# Patient Record
Sex: Male | Born: 1990 | Race: White | Hispanic: No | Marital: Married | Smoking: Current every day smoker
Health system: Southern US, Community
[De-identification: ages and names within clinical notes are randomized; demographics above are authoritative.]

## PROBLEM LIST (undated history)

## (undated) DIAGNOSIS — F419 Anxiety disorder, unspecified: Secondary | ICD-10-CM

## (undated) DIAGNOSIS — I10 Essential (primary) hypertension: Secondary | ICD-10-CM

## (undated) DIAGNOSIS — F329 Major depressive disorder, single episode, unspecified: Secondary | ICD-10-CM

## (undated) DIAGNOSIS — F32A Depression, unspecified: Secondary | ICD-10-CM

## (undated) DIAGNOSIS — I729 Aneurysm of unspecified site: Secondary | ICD-10-CM

## (undated) HISTORY — DX: Essential (primary) hypertension: I10

## (undated) HISTORY — DX: Anxiety disorder, unspecified: F41.9

## (undated) HISTORY — DX: Major depressive disorder, single episode, unspecified: F32.9

## (undated) HISTORY — PX: COSMETIC SURGERY: SHX468

## (undated) HISTORY — DX: Depression, unspecified: F32.A

---
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
## 2012-01-14 ENCOUNTER — Ambulatory Visit (INDEPENDENT_AMBULATORY_CARE_PROVIDER_SITE_OTHER): Payer: BC Managed Care – PPO | Admitting: Family Medicine

## 2012-01-14 VITALS — BP 162/79 | HR 65 | Temp 98.6°F | Resp 16 | Ht 68.5 in | Wt 156.6 lb

## 2012-01-14 DIAGNOSIS — F419 Anxiety disorder, unspecified: Secondary | ICD-10-CM

## 2012-01-14 DIAGNOSIS — F909 Attention-deficit hyperactivity disorder, unspecified type: Secondary | ICD-10-CM

## 2012-01-14 DIAGNOSIS — F411 Generalized anxiety disorder: Secondary | ICD-10-CM

## 2012-01-14 MED ORDER — AMPHETAMINE-DEXTROAMPHETAMINE 10 MG PO TABS: ORAL_TABLET | ORAL | Status: DC

## 2012-01-14 MED ORDER — ALPRAZOLAM 0.5 MG PO TABS: ORAL_TABLET | ORAL | Status: DC

## 2012-01-14 NOTE — Progress Notes (Signed)
Patient Name: Stephen Castro Date of Birth: [birth year] Medical Record Number: [id number] Gender: male Date of Encounter: 01/14/2012  History of Present Illness:  Stephen Castro is a 21 y.o. very pleasant male patient who presents with the following:  He was last seen here in November and was started on adderall- he was given 10mg  #60- he still has not gone through this prescription - he really has not used it regularly.  He has just used it for special tests/ quizzes, etc.  He was afraid of going through the medication too fast.  However, he did feel that the 10mg  was helping but did not totally control his symptoms.    He also has some anxiety- he was given xanax 0.5 #30 at his last visit.  He uses these for panic attacks.  He has noted more problems with panic since he had a bad exam result recently.  He had noted that he sometimes needed to take .75 mg to extinguish an attack.  He feels that it helps him just to know that the pills are there in case he needs one.  His bad exam happened about 3 weeks ago.   Stephen Castro notes that he has a hard time sitting still and feels anxious in certain situations, but he does not have a lot of anxiety all the time.  A lot of the time he feels fine and does not feel that he is depressed.  He has a steady girlfriend  Last took adderall about a week ago- also has quit smoking for about a week.  He feels pretty anxious currently due to a stressful event earlier today (he complained about an incorrect order at a fast food restaurant and the manager fired the employee right in front of him) and being at the doctors office now  There is no problem list on file for this patient.  No past medical history on file. No past surgical history on file. History  Substance Use Topics  . Smoking status: Never Smoker   . Smokeless tobacco: Not on file  . Alcohol Use: Not on file   No family history on file. No Known Allergies  Medication list has been reviewed and  updated.  Review of Systems: As per HPI- otherwise negative.  Physical Examination: Filed Vitals:   01/14/12 1744  BP: 162/79  Pulse: 65  Temp: 98.6 F (37 C)  TempSrc: Oral  Resp: 16  Height: 5' 8.5" (1.74 m)  Weight: 156 lb 9.6 oz (71.033 kg)   BP recheck 145/95 Body mass index is 23.46 kg/(m^2).  GEN: WDWN, NAD, Non-toxic, A & O x 3 HEENT: Atraumatic, Normocephalic. Neck supple. No masses, No LAD. Ears and Nose: No external deformity. CV: RRR, No M/G/R. No JVD. No thrill. No extra heart sounds. PULM: CTA B, no wheezes, crackles, rhonchi. No retractions. No resp. distress. No accessory muscle use. EXTR: No c/c/e NEURO Normal gait.  PSYCH: Normally interactive. Conversant. A little talkative and anxious today  Assessment and Plan: 1. ADD (attention deficit disorder with hyperactivity)  amphetamine-dextroamphetamine (ADDERALL) 10 MG tablet, DISCONTINUED: amphetamine-dextroamphetamine (ADDERALL) 10 MG tablet  2. Anxiety  ALPRAZolam (XANAX) 0.5 MG tablet    Had a long discussion with Stephen Castro and his mother today- decided to try and have him take the adderall more regularly to see if he will do better with his schoolwork- this seems to be a major point of anxiety for him.  Gave #2 rx for adderall 10mg - take 1 or 1.5 tablets BID.  #  90, with a second rx to fill 02/13/12.   Also gave rx zanax 0.5mg - may take one or two tablets as needed for panic.  #30, one RF.   He will call me in a couple of weeks with an update- Sooner if worse.   We can continue his current dose of adderall if it is helpful.  Also consider adding an SSRI (did not do great with paxil) for anxiety if needed

## 2012-09-26 ENCOUNTER — Telehealth: Payer: Self-pay

## 2012-09-26 NOTE — Telephone Encounter (Signed)
Dr.Copland Pts mother Kelen Laura would like to speak with you regarding pt's medications and also pt would like to seek counseling. Pt's mother would only like to speak with Dr.Copland regarding this issue. Best# [phone]

## 2012-09-27 NOTE — Telephone Encounter (Signed)
Patient was last seen in March. He has been taking Xanax and Adderall. I have advised he is due for follow for the Adderall. He would also like to see a counselor for depression. Has had depression for several years, do you know a counselor who may be helpful for his situation? He has gone to a couple and has not had good results. I have advised mother will be tomorrow before we call back with this information, she states this is fine.

## 2012-09-28 NOTE — Telephone Encounter (Signed)
Called his mother and went over some possible therapists.  He will come in to see me in the next couple of weeks

## 2012-10-24 ENCOUNTER — Ambulatory Visit (INDEPENDENT_AMBULATORY_CARE_PROVIDER_SITE_OTHER): Payer: BC Managed Care – PPO | Admitting: Family Medicine

## 2012-10-24 VITALS — BP 123/88 | HR 80 | Temp 98.7°F | Resp 18 | Ht 68.0 in | Wt 160.0 lb

## 2012-10-24 DIAGNOSIS — F909 Attention-deficit hyperactivity disorder, unspecified type: Secondary | ICD-10-CM

## 2012-10-24 DIAGNOSIS — F419 Anxiety disorder, unspecified: Secondary | ICD-10-CM

## 2012-10-24 DIAGNOSIS — F411 Generalized anxiety disorder: Secondary | ICD-10-CM

## 2012-10-24 MED ORDER — AMPHETAMINE-DEXTROAMPHETAMINE 10 MG PO TABS: ORAL_TABLET | ORAL | Status: DC

## 2012-10-24 MED ORDER — ALPRAZOLAM 0.5 MG PO TABS: ORAL_TABLET | ORAL | Status: DC

## 2012-10-24 NOTE — Progress Notes (Signed)
[facility] [address], [place] [address] [phone]  Date:  10/24/2012   Name:  Stephen Castro   DOB:  08-18-1991   MRN:  [id number]  PCP:  No primary provider on file.    Chief Complaint: Anxiety and ADHD   History of Present Illness:  Stephen Castro is a 21 y.o. very pleasant male patient who presents with the following:  He is here today to go over medications.  He has a new therapist who he likes- he thinks they will have a good relationship.  They have met once so far and plan to keep meeting; Tom Hedding.  He ran out of his adderall a few days ago- he stopped taking it once school let out.  However, his therapist has suggested he stay on his medication every day instead of skipping weekends and breaks as he had been doing. .  Overall Stephen Castro feels that he is doing well and this is medications are working He has a new job Nurse, children's at [employer] and that is going very well.  His grades have not been as good as he might have liked last semester however.  He had several 8am classes and he has a very hard time waking up and being alert that early and he was overextended with his work and hobbies. He has a plan to do better this spring.    He has used the xanax on occasion for panic attacks, but he has only had a couple of severe attacks over the last several months.  He is taking adderall 10 mg, 10 or 15 mg BID.    Declines a flu shot today  There is no problem list on file for this patient.   Past Medical History  Diagnosis Date  . Depression   . Anxiety     Past Surgical History  Procedure Date  . Cosmetic surgery     History  Substance Use Topics  . Smoking status: Never Smoker   . Smokeless tobacco: Not on file  . Alcohol Use: Yes    No family history on file.  No Known Allergies  Medication list has been reviewed and updated.  Current Outpatient Prescriptions on File Prior to Visit  Medication Sig Dispense Refill  .  ALPRAZolam (XANAX) 0.5 MG tablet Take one or two tablets daily as needed for panic attacks  30 tablet  1  . amphetamine-dextroamphetamine (ADDERALL) 10 MG tablet Take one of one and a half tablets twice daily  90 tablet  0    Review of Systems:  As per HPI- otherwise negative.   Physical Examination: Filed Vitals:   10/24/12 1558  BP: 123/88  Pulse: 80  Temp: 98.7 F (37.1 C)  Resp: 18   Filed Vitals:   10/24/12 1558  Height: 5\' 8"  (1.727 m)  Weight: 160 lb (72.576 kg)   Body mass index is 24.33 kg/(m^2). Ideal Body Weight: Weight in (lb) to have BMI = 25: 164.1   GEN: WDWN, NAD, Non-toxic, A & O x 3 HEENT: Atraumatic, Normocephalic. Neck supple. No masses, No LAD. Ears and Nose: No external deformity. CV: RRR, No M/G/R. No JVD. No thrill. No extra heart sounds. PULM: CTA B, no wheezes, crackles, rhonchi. No retractions. No resp. distress. No accessory muscle use. EXTR: No c/c/e NEURO Normal gait.  PSYCH: Normally interactive. Conversant. Not depressed or anxious appearing.  Calm demeanor.    Assessment and Plan: 1. Anxiety  ALPRAZolam (XANAX) 0.5  MG tablet  2. ADHD (attention deficit hyperactivity disorder)  amphetamine-dextroamphetamine (ADDERALL) 10 MG tablet, amphetamine-dextroamphetamine (ADDERALL) 10 MG tablet, DISCONTINUED: amphetamine-dextroamphetamine (ADDERALL) 10 MG tablet   ADHD and anxiety, doing well.  Refilled his xanax and adderal- xanax 30 with 1RF and adderall for December, January and February.  He will continue to see his therapist and let me know if any problems come up.  He will cut down on time spent on hobbies this semester which should help with his grades  Meds ordered this encounter  Medications  . ALPRAZolam (XANAX) 0.5 MG tablet    Sig: Take one or two tablets daily as needed for panic attacks    Dispense:  30 tablet    Refill:  1  . DISCONTD: amphetamine-dextroamphetamine (ADDERALL) 10 MG tablet    Sig: Take one or one and a half tablets  twice daily    Dispense:  90 tablet    Refill:  0  . amphetamine-dextroamphetamine (ADDERALL) 10 MG tablet    Sig: Take one or one and a half tablets twice daily    Dispense:  90 tablet    Refill:  0    To fill 11/21/2012  . amphetamine-dextroamphetamine (ADDERALL) 10 MG tablet    Sig: Take one or one and a half tablets twice daily    Dispense:  90 tablet    Refill:  0    To fill 12/20/2012     Abbe Amsterdam, MD

## 2012-10-24 NOTE — Patient Instructions (Addendum)
Use the medications as needed and let me know if you are having any problems. Happy Holidays!

## 2013-04-03 ENCOUNTER — Telehealth: Payer: Self-pay

## 2013-04-03 DIAGNOSIS — F909 Attention-deficit hyperactivity disorder, unspecified type: Secondary | ICD-10-CM

## 2013-04-03 NOTE — Telephone Encounter (Signed)
PT IN NEED OF HIS ADDERALL. PLEASE CALL [phone]

## 2013-04-04 ENCOUNTER — Ambulatory Visit (INDEPENDENT_AMBULATORY_CARE_PROVIDER_SITE_OTHER): Payer: BC Managed Care – PPO | Admitting: Family Medicine

## 2013-04-04 VITALS — BP 124/84 | HR 63 | Temp 98.3°F | Resp 16 | Ht 69.0 in | Wt 151.2 lb

## 2013-04-04 DIAGNOSIS — F909 Attention-deficit hyperactivity disorder, unspecified type: Secondary | ICD-10-CM | POA: Insufficient documentation

## 2013-04-04 DIAGNOSIS — F411 Generalized anxiety disorder: Secondary | ICD-10-CM | POA: Insufficient documentation

## 2013-04-04 MED ORDER — AMPHETAMINE-DEXTROAMPHETAMINE 10 MG PO TABS: ORAL_TABLET | ORAL | Status: DC

## 2013-04-04 NOTE — Patient Instructions (Signed)
Continue to use the adderall as directed.  Good luck in [place]!

## 2013-04-04 NOTE — Telephone Encounter (Signed)
Called him to advise he is due for follow up this month. Unable to leave message, voice mail full  Please advise on refill. Pended.

## 2013-04-04 NOTE — Progress Notes (Signed)
[facility] [address], [place] [address] [phone]  Date:  04/04/2013   Name:  Stephen Castro   DOB:  [birth year]   MRN:  [id number]  PCP:  No primary provider on file.    Chief Complaint: Medication Refill   History of Present Illness:  Stephen Castro is a 22 y.o. very pleasant male patient who presents with the following:  He is here today for an ADD refill- he realized he was nearly out of his medication yesterday.  He plans to go to [place] this summer to try his hand at music.   He has stopped drinking- no alcohol for the last 3 months. This seems to be good for his mental health He has used the xanax on occasion- he does not need more.  Uses just occasionally for panic symptoms.  He has been taking 15 mg of adderall BI.  He tolerates this dose well, sleeping normally and good appetite.  Mood has been overall good.    There are no active problems to display for this patient.   Past Medical History  Diagnosis Date  . Depression   . Anxiety     Past Surgical History  Procedure Laterality Date  . Cosmetic surgery      History  Substance Use Topics  . Smoking status: Never Smoker   . Smokeless tobacco: Not on file  . Alcohol Use: No    No family history on file.  No Known Allergies  Medication list has been reviewed and updated.  Current Outpatient Prescriptions on File Prior to Visit  Medication Sig Dispense Refill  . ALPRAZolam (XANAX) 0.5 MG tablet Take one or two tablets daily as needed for panic attacks  30 tablet  1  . amphetamine-dextroamphetamine (ADDERALL) 10 MG tablet Take one or one and a half tablets twice daily  90 tablet  0  . amphetamine-dextroamphetamine (ADDERALL) 10 MG tablet Take one or one and a half tablets twice daily  90 tablet  0   No current facility-administered medications on file prior to visit.    Review of Systems:  As per HPI- otherwise negative.   Physical Examination: Filed Vitals:   04/04/13 1626  BP: 124/84  Pulse: 63  Temp: 98.3 F (36.8 C)  Resp: 16   Filed Vitals:   04/04/13 1626  Height: 5\' 9"  (1.753 m)  Weight: 151 lb 3.2 oz (68.584 kg)   Body mass index is 22.32 kg/(m^2). Ideal Body Weight: Weight in (lb) to have BMI = 25: 168.9  GEN: WDWN, NAD, Non-toxic, A & O x 3 HEENT: Atraumatic, Normocephalic. Neck supple. No masses, No LAD. Ears and Nose: No external deformity. CV: RRR, No M/G/R. No JVD. No thrill. No extra heart sounds. PULM: CTA B, no wheezes, crackles, rhonchi. No retractions. No resp. distress. No accessory muscle use. EXTR: No c/c/e NEURO Normal gait.  PSYCH: Normally interactive. Conversant. Not depressed or anxious appearing.  Calm demeanor.    Assessment and Plan: ADHD (attention deficit hyperactivity disorder) - Plan: amphetamine-dextroamphetamine (ADDERALL) 10 MG tablet, amphetamine-dextroamphetamine (ADDERALL) 10 MG tablet, DISCONTINUED: amphetamine-dextroamphetamine (ADDERALL) 10 MG tablet  Refilled his adderall 15 mg BID for another 3 months today.  Follow- up in 6 months, may refill for another 3 months in September.    Signed Abbe Amsterdam, MD

## 2013-08-01 ENCOUNTER — Ambulatory Visit (INDEPENDENT_AMBULATORY_CARE_PROVIDER_SITE_OTHER): Payer: BC Managed Care – PPO | Admitting: Family Medicine

## 2013-08-01 VITALS — BP 130/80 | HR 77 | Temp 98.1°F | Resp 16 | Ht 68.0 in | Wt 154.4 lb

## 2013-08-01 DIAGNOSIS — F419 Anxiety disorder, unspecified: Secondary | ICD-10-CM

## 2013-08-01 DIAGNOSIS — F909 Attention-deficit hyperactivity disorder, unspecified type: Secondary | ICD-10-CM

## 2013-08-01 DIAGNOSIS — B86 Scabies: Secondary | ICD-10-CM

## 2013-08-01 DIAGNOSIS — F411 Generalized anxiety disorder: Secondary | ICD-10-CM

## 2013-08-01 MED ORDER — AMPHETAMINE-DEXTROAMPHETAMINE 10 MG PO TABS: ORAL_TABLET | ORAL | Status: DC

## 2013-08-01 MED ORDER — ALPRAZOLAM 0.5 MG PO TABS: ORAL_TABLET | ORAL | Status: DC

## 2013-08-01 MED ORDER — PERMETHRIN 5 % EX CREA: TOPICAL_CREAM | Freq: Once | CUTANEOUS | Status: DC

## 2013-08-01 MED ORDER — HYDROXYZINE HCL 25 MG PO TABS: 25.0000 mg | ORAL_TABLET | Freq: Three times a day (TID) | ORAL | Status: DC | PRN

## 2013-08-01 NOTE — Progress Notes (Signed)
[facility] [address] [place] [address] [phone]  Date:  08/01/2013   Name:  Stephen Castro   DOB:  08-Jun-1991   MRN:  [id number]  PCP:  No primary provider on file.    Chief Complaint: Medication Refill and Rash   History of Present Illness:  Stephen Castro is a 22 y.o. very pleasant male patient who presents with the following:  Here today with a rash for a couple of weeks. He recently moved to a new house about one month ago.  He has 3 roommates and they are ok- however his GF does have some of these sx as well.  He has checked his bed and has not seen any bugs.  He has tried lotion but it did not help.  He was not sure if this just might be dry skin, but he is concerned about scabies.   He is doing well with his ADD and needs a refill of his aderall.  He has not had a xanax RF in a long time and also needs more of this.  He did have a cold recently but this is getting better.    Patient Active Problem List   Diagnosis Date Noted  . ADHD (attention deficit hyperactivity disorder) 04/04/2013  . Anxiety state, unspecified 04/04/2013    Past Medical History  Diagnosis Date  . Depression   . Anxiety     History reviewed. No pertinent past surgical history.  History  Substance Use Topics  . Smoking status: Current Every Day Smoker  . Smokeless tobacco: Not on file  . Alcohol Use: No    History reviewed. No pertinent family history.  No Known Allergies  Medication list has been reviewed and updated.  Current Outpatient Prescriptions on File Prior to Visit  Medication Sig Dispense Refill  . ALPRAZolam (XANAX) 0.5 MG tablet Take one or two tablets daily as needed for panic attacks  30 tablet  1  . amphetamine-dextroamphetamine (ADDERALL) 10 MG tablet Take one or one and a half tablets twice daily  90 tablet  0  . amphetamine-dextroamphetamine (ADDERALL) 10 MG tablet Take one or one and a half tablets twice daily  90 tablet  0  .  amphetamine-dextroamphetamine (ADDERALL) 10 MG tablet Take one or one and a half tablets twice daily  90 tablet  0   No current facility-administered medications on file prior to visit.    Review of Systems:  As per HPI- otherwise negative.   Physical Examination: Filed Vitals:   08/01/13 0950  BP: 130/80  Pulse: 77  Temp: 98.1 F (36.7 C)  Resp: 16   Filed Vitals:   08/01/13 0950  Height: 5\' 8"  (1.727 m)  Weight: 154 lb 6.4 oz (70.035 kg)   Body mass index is 23.48 kg/(m^2). Ideal Body Weight: Weight in (lb) to have BMI = 25: 164.1  GEN: WDWN, NAD, Non-toxic, A & O x 3, looks well HEENT: Atraumatic, Normocephalic. Neck supple. No masses, No LAD.  Bilateral TM wnl, oropharynx normal.  PEERL,EOMI.   Ears and Nose: No external deformity. CV: RRR, No M/G/R. No JVD. No thrill. No extra heart sounds. PULM: CTA B, no wheezes, crackles, rhonchi. No retractions. No resp. distress. No accessory muscle use. ABD: S, NT, ND, +BS. No rebound. No HSM. EXTR: No c/c/e NEURO Normal gait.  PSYCH: Normally interactive. Conversant. Not depressed or anxious appearing.  Calm demeanor.  He has a rash typical of scabies on his hands, trunk/  waistline, groin.    Assessment and Plan: Scabies - Plan: permethrin (ELIMITE) 5 % cream, hydrOXYzine (ATARAX/VISTARIL) 25 MG tablet  Anxiety - Plan: ALPRAZolam (XANAX) 0.5 MG tablet  ADHD (attention deficit hyperactivity disorder) - Plan: amphetamine-dextroamphetamine (ADDERALL) 10 MG tablet, amphetamine-dextroamphetamine (ADDERALL) 10 MG tablet, amphetamine-dextroamphetamine (ADDERALL) 10 MG tablet  Refilled his adderall and xanax.   Suspect scabies.  Treat with elimite cream.   See patient instructions for more details.    His GF will come in for tx as well.    Signed Abbe Amsterdam, MD

## 2013-08-01 NOTE — Patient Instructions (Addendum)
It appears that you may have scabies.  Use the cream as directed.  Your GF should also be treated.  Have her call her doctor for an rx for the cream.  Wash all bedding, towels, etc in hot water.  You may also want to try an air tight plastic cover for your mattress.  You could also call an exterminator to come and look at your home.    Use the hydroxyzine as needed for itching; however remember it can make you sleepy.  Do not use it when you need to drive and do not combine it with xanax   Scabies Scabies are small bugs (mites) that burrow under the skin and cause red bumps and severe itching. These bugs can only be seen with a microscope. Scabies are highly contagious. They can spread easily from person to person by direct contact. They are also spread through sharing clothing or linens that have the scabies mites living in them. It is not unusual for an entire family to become infected through shared towels, clothing, or bedding.  HOME CARE INSTRUCTIONS   Your caregiver may prescribe a cream or lotion to kill the mites. If cream is prescribed, massage the cream into the entire body from the neck to the bottom of both feet. Also massage the cream into the scalp and face if your child is less than 46 year old. Avoid the eyes and mouth. Do not wash your hands after application.  Leave the cream on for 8 to 12 hours. Your child should bathe or shower after the 8 to 12 hour application period. Sometimes it is helpful to apply the cream to your child right before bedtime.  One treatment is usually effective and will eliminate approximately 95% of infestations. For severe cases, your caregiver may decide to repeat the treatment in 1 week. Everyone in your household should be treated with one application of the cream.  New rashes or burrows should not appear within 24 to 48 hours after successful treatment. However, the itching and rash may last for 2 to 4 weeks after successful treatment. Your caregiver  may prescribe a medicine to help with the itching or to help the rash go away more quickly.  Scabies can live on clothing or linens for up to 3 days. All of your child's recently used clothing, towels, stuffed toys, and bed linens should be washed in hot water and then dried in a dryer for at least 20 minutes on high heat. Items that cannot be washed should be enclosed in a plastic bag for at least 3 days.  To help relieve itching, bathe your child in a cool bath or apply cool washcloths to the affected areas.  Your child may return to school after treatment with the prescribed cream. SEEK MEDICAL CARE IF:   The itching persists longer than 4 weeks after treatment.  The rash spreads or becomes infected. Signs of infection include red blisters or yellow-tan crust. Document Released: 10/19/2005 Document Revised: 01/11/2012 Document Reviewed: 02/27/2009 [facility] Patient Information 2014 [place], [place].

## 2013-08-08 ENCOUNTER — Ambulatory Visit (INDEPENDENT_AMBULATORY_CARE_PROVIDER_SITE_OTHER): Payer: BC Managed Care – PPO | Admitting: Family Medicine

## 2013-08-08 ENCOUNTER — Telehealth: Payer: Self-pay

## 2013-08-08 VITALS — BP 118/68 | HR 62 | Temp 98.4°F | Resp 16 | Ht 68.5 in | Wt 150.4 lb

## 2013-08-08 DIAGNOSIS — L299 Pruritus, unspecified: Secondary | ICD-10-CM

## 2013-08-08 MED ORDER — PREDNISONE 20 MG PO TABS: ORAL_TABLET | ORAL | Status: DC

## 2013-08-08 MED ORDER — CEPHALEXIN 500 MG PO CAPS: 500.0000 mg | ORAL_CAPSULE | Freq: Three times a day (TID) | ORAL | Status: DC

## 2013-08-08 NOTE — Patient Instructions (Addendum)
Use the prednisone and keflex as directed.  If you are not feeling better by Thursday please give me a call.

## 2013-08-08 NOTE — Telephone Encounter (Signed)
Called pt, he states his scabies has gotten worse. He wants to know if he should apply again and then wait a week? He has spread this to 2 people and he is miserable. He feels the weekly treatments are not working. Any suggestions?

## 2013-08-08 NOTE — Progress Notes (Signed)
[facility] [address] [place] [address] [phone]  Date:  08/08/2013   Name:  Stephen Castro   DOB:  December 28, 1990   MRN:  [id number]  PCP:  No primary provider on file.    Chief Complaint: Follow-up   History of Present Illness:  Stephen Castro is a 22 y.o. very pleasant male patient who presents with the following:  Here about one week ago with likely scabies rash.  His GF had similar sx.  Treated with permethrin cream and hydroxyzine.  He notes that he is still very itchy.  He is having a hard time sleeping, and does not know if he is getting better or not.  He has used the permethrin twice already  Patient Active Problem List   Diagnosis Date Noted  . ADHD (attention deficit hyperactivity disorder) 04/04/2013  . Anxiety state, unspecified 04/04/2013    Past Medical History  Diagnosis Date  . Depression   . Anxiety     No past surgical history on file.  History  Substance Use Topics  . Smoking status: Current Every Day Smoker  . Smokeless tobacco: Not on file  . Alcohol Use: No    No family history on file.  No Known Allergies  Medication list has been reviewed and updated.  Current Outpatient Prescriptions on File Prior to Visit  Medication Sig Dispense Refill  . ALPRAZolam (XANAX) 0.5 MG tablet Take one or two tablets daily as needed for panic attacks  30 tablet  1  . amphetamine-dextroamphetamine (ADDERALL) 10 MG tablet Take one or one and a half tablets twice daily  90 tablet  0  . amphetamine-dextroamphetamine (ADDERALL) 10 MG tablet Take one or one and a half tablets twice daily  90 tablet  0  . amphetamine-dextroamphetamine (ADDERALL) 10 MG tablet Take one or one and a half tablets twice daily  90 tablet  0  . hydrOXYzine (ATARAX/VISTARIL) 25 MG tablet Take 1 tablet (25 mg total) by mouth 3 (three) times daily as needed for itching.  30 tablet  0  . permethrin (ELIMITE) 5 % cream Apply topically once. Apply neck to toes and  leave on overnight.  May repeat in 2 weeks if needed  60 g  1   No current facility-administered medications on file prior to visit.    Review of Systems:  As per HPI- otherwise negative. No fever, otherwise well  Physical Examination: Filed Vitals:   08/08/13 1548  BP: 118/68  Pulse: 62  Temp: 98.4 F (36.9 C)  Resp: 16   Filed Vitals:   08/08/13 1548  Height: 5' 8.5" (1.74 m)  Weight: 150 lb 6.4 oz (68.221 kg)   Body mass index is 22.53 kg/(m^2). Ideal Body Weight: Weight in (lb) to have BMI = 25: 166.5  GEN: WDWN, NAD, Non-toxic, A & O x 3, looks well except for skin rash HEENT: Atraumatic, Normocephalic. Neck supple. No masses, No LAD. Ears and Nose: No external deformity. CV: RRR, No M/G/R. No JVD. No thrill. No extra heart sounds. PULM: CTA B, no wheezes, crackles, rhonchi. No retractions. No resp. distress. No accessory muscle use. EXTR: No c/c/e NEURO Normal gait.  PSYCH: Normally interactive. Conversant. Not depressed or anxious appearing.  Calm demeanor.  Excoriated rash worst on hands and feet.  He has more scattered lesions on the lower back, upper thighs and waist.  He also has some small urticarial lesions adjacent to excoriated lesions mostly on the thighs.   Rash  continues to appear typical of scabies.     Assessment and Plan: Itching - Plan: predniSONE (DELTASONE) 20 MG tablet, cephALEXin (KEFLEX) 500 MG capsule, DISCONTINUED: predniSONE (DELTASONE) 20 MG tablet, DISCONTINUED: cephALEXin (KEFLEX) 500 MG capsule  Severe pruritis with treated scabies.  Will use prednisone for itching, and keflex for possible developing secondary infection due to intense scratching.  He will let me know if not getting better soon- will refer to derm if not better   Signed Abbe Amsterdam, MD

## 2013-08-08 NOTE — Telephone Encounter (Signed)
Called him back.  He relates that he has blisters on his skin.  Advised him to RTC for a recheck.  He will come in today

## 2013-08-08 NOTE — Telephone Encounter (Signed)
Pt of Dr.Copland. Would like to speak with her or nurse about an issue. Wants to know if he should come in to be seen. Call at [phone].

## 2013-09-17 ENCOUNTER — Ambulatory Visit (INDEPENDENT_AMBULATORY_CARE_PROVIDER_SITE_OTHER): Payer: BC Managed Care – PPO | Admitting: Family Medicine

## 2013-09-17 VITALS — BP 110/70 | HR 70 | Temp 97.6°F | Resp 16 | Ht 69.0 in | Wt 149.8 lb

## 2013-09-17 DIAGNOSIS — B86 Scabies: Secondary | ICD-10-CM

## 2013-09-17 DIAGNOSIS — L299 Pruritus, unspecified: Secondary | ICD-10-CM

## 2013-09-17 MED ORDER — PERMETHRIN 5 % EX CREA: TOPICAL_CREAM | Freq: Once | CUTANEOUS | Status: DC

## 2013-09-17 MED ORDER — MUPIROCIN 2 % EX OINT: 1.0000 "application " | TOPICAL_OINTMENT | Freq: Three times a day (TID) | CUTANEOUS | Status: DC

## 2013-09-17 MED ORDER — CEPHALEXIN 500 MG PO CAPS: 500.0000 mg | ORAL_CAPSULE | Freq: Three times a day (TID) | ORAL | Status: DC

## 2013-09-17 MED ORDER — PREDNISONE 20 MG PO TABS: ORAL_TABLET | ORAL | Status: DC

## 2013-09-17 NOTE — Progress Notes (Addendum)
Subjective:  This chart was scribed for Norberto Sorenson, MD by Carl Best, Medical Scribe. This patient was seen in Room 5 and the patient's care was started at 4:08 PM.   Patient ID: Stephen Castro, male    DOB: December 08, 1990, 22 y.o.   MRN: [id number]  Chief Complaint  Patient presents with  . Pruritis    Possible scabies, X 5 days ago    HPI HPI Comments: Stephen Castro is a 22 y.o. male who presents to the [facility] complaining of an itchy pruritis rash located on his groin, thighs, calves, and arms bilaterally and right and left flank that started five days ago.  He denies seeing any rash between his fingers and toes.  The patient states that he experienced these symptoms a month ago and was diagnosed with scabies.  He states that he was prescribed Prednisone and cephalexin which alleviated his symptoms.  He states that his 63 year old son just recently contracted scabies and is currently being treated for his symptoms.  He states that his wife has yet to contract scabies.  He denies having any bed bugs.     Past Medical History  Diagnosis Date  . Depression   . Anxiety    Current Outpatient Prescriptions on File Prior to Visit  Medication Sig Dispense Refill  . ALPRAZolam (XANAX) 0.5 MG tablet Take one or two tablets daily as needed for panic attacks  30 tablet  1  . amphetamine-dextroamphetamine (ADDERALL) 10 MG tablet Take one or one and a half tablets twice daily  90 tablet  0  . amphetamine-dextroamphetamine (ADDERALL) 10 MG tablet Take one or one and a half tablets twice daily  90 tablet  0  . amphetamine-dextroamphetamine (ADDERALL) 10 MG tablet Take one or one and a half tablets twice daily  90 tablet  0  . cephALEXin (KEFLEX) 500 MG capsule Take 1 capsule (500 mg total) by mouth 3 (three) times daily.  21 capsule  0  . hydrOXYzine (ATARAX/VISTARIL) 25 MG tablet Take 1 tablet (25 mg total) by mouth 3 (three) times daily as needed for itching.  30 tablet  0  . permethrin (ELIMITE) 5 % cream  Apply topically once. Apply neck to toes and leave on overnight.  May repeat in 2 weeks if needed  60 g  1  . predniSONE (DELTASONE) 20 MG tablet Take 2 pills a day for 3 days, then 1 pill a day for 3 days  9 tablet  0   No current facility-administered medications on file prior to visit.   No Known Allergies  Review of Systems  Constitutional: Negative for fever, chills, activity change and appetite change.  Cardiovascular: Negative for leg swelling.  Gastrointestinal: Negative for nausea, vomiting, abdominal pain, diarrhea and constipation.  Musculoskeletal: Negative for gait problem, joint swelling and myalgias.  Skin: Positive for color change, rash and wound.  Neurological: Negative for weakness and numbness.  Hematological: Negative for adenopathy. Does not bruise/bleed easily.  Psychiatric/Behavioral: Positive for sleep disturbance.    BP 110/70  Pulse 70  Temp(Src) 97.6 F (36.4 C) (Oral)  Resp 16  Ht 5\' 9"  (1.753 m)  Wt 149 lb 12.8 oz (67.949 kg)  BMI 22.11 kg/m2  SpO2 97%  Objective:  Physical Exam  Nursing note and vitals reviewed. Constitutional: He is oriented to person, place, and time. He appears well-developed and well-nourished. No distress.  HENT:  Head: Normocephalic and atraumatic.  Eyes: Conjunctivae and EOM are normal.  Neck: Normal range of  motion. Neck supple.  Cardiovascular: Normal rate, regular rhythm and normal heart sounds.   Pulmonary/Chest: Effort normal and breath sounds normal.  Musculoskeletal: Normal range of motion. He exhibits no edema.  Neurological: He is alert and oriented to person, place, and time.  Skin: Skin is warm and dry. Rash noted. Rash is papular. He is not diaphoretic.  Diffuse erythematous papules - mainly over upper inner thighs, waist band, groin, some over arms and trunk and lower legs. Some excoriated. None in the intertriginous areas.  Psychiatric: He has a normal mood and affect. His behavior is normal.       Assessment & Plan:  Scabies - Plan: permethrin (ELIMITE) 5 % cream  Itching - Plan: cephALEXin (KEFLEX) 500 MG capsule, predniSONE (DELTASONE) 20 MG tablet  Recommended elimite cream alone - has the prn atarax he can use for itching. Pt really wanted to repeat the prednisone and keflex however - asked several times and was insistent that he didn't feel his sxs improved until he was on these meds. Had to stay out of work x 10d prior due to scabies since he works in [employer]nel [employer] and is really hoping if he is on the prednisone and keflex initially it will not be as drawn out course this time.  OK to go ahead and retry prednisone if he wants but encouraged pt to not fill keflex - unless signs of cellulitis develop. Can fill the paper keflex rx given if any increased pain, redness, swelling, or purulent drainage. Otherwise, try top bactroban for spot treatment to avoid infection w/ prednisone immunosuppression. Advised the patient against using too many antibiotics due to increased risk of a GI infection.  Pt agreeable to plan. Gave work note for 3d - he was out of work 10d prior but this infection does not seem as bad but ok to call and will lengthen work note if sxs persist.   Discussed different types of scabies vs bedbugs - reviewed methods for elimination and cleansing house/garments/etc. Discussed importance of all of household members being treated at same time. Pt very agreeable and has already started on most of this.   Meds ordered this encounter  Medications  . permethrin (ELIMITE) 5 % cream    Sig: Apply topically once. Apply neck to toes and leave on overnight.  May repeat in 2 weeks if needed    Dispense:  120 g    Refill:  1  . cephALEXin (KEFLEX) 500 MG capsule    Sig: Take 1 capsule (500 mg total) by mouth 3 (three) times daily.    Dispense:  21 capsule    Refill:  0  . predniSONE (DELTASONE) 20 MG tablet    Sig: Take 2 pills a day for 3 days, then 1 pill a day for 3 days     Dispense:  9 tablet    Refill:  0  . mupirocin ointment (BACTROBAN) 2 %    Sig: Apply 1 application topically 3 (three) times daily.    Dispense:  22 g    Refill:  1    I personally performed the services described in this documentation, which was scribed in my presence. The recorded information has been reviewed and considered, and addended by me as needed.  Norberto Sorenson, MD MPH

## 2013-09-17 NOTE — Patient Instructions (Signed)
Scabies  Scabies are small bugs (mites) that burrow under the skin and cause red bumps and severe itching. These bugs can only be seen with a microscope. Scabies are highly contagious. They can spread easily from person to person by direct contact. They are also spread through sharing clothing or linens that have the scabies mites living in them. It is not unusual for an entire family to become infected through shared towels, clothing, or bedding.   HOME CARE INSTRUCTIONS   · Your caregiver may prescribe a cream or lotion to kill the mites. If cream is prescribed, massage the cream into the entire body from the neck to the bottom of both feet. Also massage the cream into the scalp and face if your child is less than 1 year old. Avoid the eyes and mouth. Do not wash your hands after application.  · Leave the cream on for 8 to 12 hours. Your child should bathe or shower after the 8 to 12 hour application period. Sometimes it is helpful to apply the cream to your child right before bedtime.  · One treatment is usually effective and will eliminate approximately 95% of infestations. For severe cases, your caregiver may decide to repeat the treatment in 1 week. Everyone in your household should be treated with one application of the cream.  · New rashes or burrows should not appear within 24 to 48 hours after successful treatment. However, the itching and rash may last for 2 to 4 weeks after successful treatment. Your caregiver may prescribe a medicine to help with the itching or to help the rash go away more quickly.  · Scabies can live on clothing or linens for up to 3 days. All of your child's recently used clothing, towels, stuffed toys, and bed linens should be washed in hot water and then dried in a dryer for at least 20 minutes on high heat. Items that cannot be washed should be enclosed in a plastic bag for at least 3 days.  · To help relieve itching, bathe your child in a cool bath or apply cool washcloths to the  affected areas.  · Your child may return to school after treatment with the prescribed cream.  SEEK MEDICAL CARE IF:   · The itching persists longer than 4 weeks after treatment.  · The rash spreads or becomes infected. Signs of infection include red blisters or yellow-tan crust.  Document Released: 10/19/2005 Document Revised: 01/11/2012 Document Reviewed: 02/27/2009  Exit[employer]® Patient Information ©2014 [employer].

## 2013-09-22 ENCOUNTER — Ambulatory Visit (INDEPENDENT_AMBULATORY_CARE_PROVIDER_SITE_OTHER): Payer: BC Managed Care – PPO | Admitting: Internal Medicine

## 2013-09-22 VITALS — BP 122/70 | HR 58 | Temp 97.6°F | Resp 18 | Ht 69.5 in | Wt 153.0 lb

## 2013-09-22 DIAGNOSIS — L299 Pruritus, unspecified: Secondary | ICD-10-CM

## 2013-09-22 DIAGNOSIS — B86 Scabies: Secondary | ICD-10-CM

## 2013-09-22 NOTE — Patient Instructions (Signed)
Scabies Scabies are small bugs (mites) that burrow under the skin and cause red bumps and severe itching. These bugs can only be seen with a microscope. Scabies are highly contagious. They can spread easily from person to person by direct contact. They are also spread through sharing clothing or linens that have the scabies mites living in them. It is not unusual for an entire family to become infected through shared towels, clothing, or bedding.  HOME CARE INSTRUCTIONS   Your caregiver may prescribe a cream or lotion to kill the mites. If cream is prescribed, massage the cream into the entire body from the neck to the bottom of both feet. Also massage the cream into the scalp and face if your child is less than 24 year old. Avoid the eyes and mouth. Do not wash your hands after application.  Leave the cream on for 8 to 12 hours. Your child should bathe or shower after the 8 to 12 hour application period. Sometimes it is helpful to apply the cream to your child right before bedtime.  One treatment is usually effective and will eliminate approximately 95% of infestations. For severe cases, your caregiver may decide to repeat the treatment in 1 week. Everyone in your household should be treated with one application of the cream.  New rashes or burrows should not appear within 24 to 48 hours after successful treatment. However, the itching and rash may last for 2 to 4 weeks after successful treatment. Your caregiver may prescribe a medicine to help with the itching or to help the rash go away more quickly.  Scabies can live on clothing or linens for up to 3 days. All of your child's recently used clothing, towels, stuffed toys, and bed linens should be washed in hot water and then dried in a dryer for at least 20 minutes on high heat. Items that cannot be washed should be enclosed in a plastic bag for at least 3 days.  To help relieve itching, bathe your child in a cool bath or apply cool washcloths to the  affected areas.  Your child may return to school after treatment with the prescribed cream. SEEK MEDICAL CARE IF:   The itching persists longer than 4 weeks after treatment.  The rash spreads or becomes infected. Signs of infection include red blisters or yellow-tan crust. Document Released: 10/19/2005 Document Revised: 01/11/2012 Document Reviewed: 02/27/2009 [facility] Patient Information 2014 [place], [place]. Stress Stress-related medical problems are becoming increasingly common. The body has a built-in physical response to stressful situations. Faced with pressure, challenge or danger, we need to react quickly. Our bodies release hormones such as cortisol and adrenaline to help do this. These hormones are part of the "fight or flight" response and affect the metabolic rate, heart rate and blood pressure, resulting in a heightened, stressed state that prepares the body for optimum performance in dealing with a stressful situation. It is likely that early man required these mechanisms to stay alive, but usually modern stresses do not call for this, and the same hormones released in today's world can damage health and reduce coping ability. CAUSES  Pressure to perform at work, at school or in sports.  Threats of physical violence.  Money worries.  Arguments.  Family conflicts.  Divorce or separation from significant other.  Bereavement.  New job or unemployment.  Changes in location.  Alcohol or drug abuse. SOMETIMES, THERE IS NO PARTICULAR REASON FOR DEVELOPING STRESS. Almost all people are at risk of being stressed at  some time in their lives. It is important to know that some stress is temporary and some is long term.  Temporary stress will go away when a situation is resolved. Most people can cope with short periods of stress, and it can often be relieved by relaxing, taking a walk, chatting through issues with friends, or having a good night's sleep.  Chronic (long-term,  continuous) stress is much harder to deal with. It can be psychologically and emotionally damaging. It can be harmful both for an individual and for friends and family. SYMPTOMS Everyone reacts to stress differently. There are some common effects that help [place] recognize it. In times of extreme stress, people may:  Shake uncontrollably.  Breathe faster and deeper than normal (hyperventilate).  Vomit.  For people with asthma, stress can trigger an attack.  For some people, stress may trigger migraine headaches, ulcers, and body pain. PHYSICAL EFFECTS OF STRESS MAY INCLUDE:  Loss of energy.  Skin problems.  Aches and pains resulting from tense muscles, including neck ache, backache and tension headaches.  Increased pain from arthritis and other conditions.  Irregular heart beat (palpitations).  Periods of irritability or anger.  Apathy or depression.  Anxiety (feeling uptight or worrying).  Unusual behavior.  Loss of appetite.  Comfort eating.  Lack of concentration.  Loss of, or decreased, sex-drive.  Increased smoking, drinking, or recreational drug use.  For women, missed periods.  Ulcers, joint pain, and muscle pain. Post-traumatic stress is the stress caused by any serious accident, strong emotional damage, or extremely difficult or violent experience such as rape or war. Post-traumatic stress victims can experience mixtures of emotions such as fear, shame, depression, guilt or anger. It may include recurrent memories or images that may be haunting. These feelings can last for weeks, months or even years after the traumatic event that triggered them. Specialized treatment, possibly with medicines and psychological therapies, is available. If stress is causing physical symptoms, severe distress or making it difficult for you to function as normal, it is worth seeing your caregiver. It is important to remember that although stress is a usual part of life, extreme or  prolonged stress can lead to other illnesses that will need treatment. It is better to visit a doctor sooner rather than later. Stress has been linked to the development of high blood pressure and heart disease, as well as insomnia and depression. There is no diagnostic test for stress since everyone reacts to it differently. But a caregiver will be able to spot the physical symptoms, such as:  Headaches.  Shingles.  Ulcers. Emotional distress such as intense worry, low mood or irritability should be detected when the doctor asks pertinent questions to identify any underlying problems that might be the cause. In case there are physical reasons for the symptoms, the doctor may also want to do some tests to exclude certain conditions. If you feel that you are suffering from stress, try to identify the aspects of your life that are causing it. Sometimes you may not be able to change or avoid them, but even a small change can have a positive ripple effect. A simple lifestyle change can make all the difference. STRATEGIES THAT CAN HELP DEAL WITH STRESS:  Delegating or sharing responsibilities.  Avoiding confrontations.  Learning to be more assertive.  Regular exercise.  Avoid using alcohol or street drugs to cope.  Eating a healthy, balanced diet, rich in fruit and vegetables and proteins.  Finding humor or absurdity in stressful situations.  Never taking on more than you know you can handle comfortably.  Organizing your time better to get as much done as possible.  Talking to friends or family and sharing your thoughts and fears.  Listening to music or relaxation tapes.  Tensing and then relaxing your muscles, starting at the toes and working up to the head and neck. If you think that you would benefit from help, either in identifying the things that are causing your stress or in learning techniques to help you relax, see a caregiver who is capable of helping you with this. Rather than  relying on medications, it is usually better to try and identify the things in your life that are causing stress and try to deal with them. There are many techniques of managing stress including counseling, psychotherapy, aromatherapy, yoga, and exercise. Your caregiver can help you determine what is best for you. Document Released: 01/09/2003 Document Revised: 01/11/2012 Document Reviewed: 12/06/2007 [facility] Patient Information 2014 [place], [place].

## 2013-09-22 NOTE — Progress Notes (Signed)
  Subjective:    Patient ID: Stephen Castro, male    DOB: [birth year], 22 y.o.   MRN: [id number]  HPI Pt is here for scabies this is the third time he is here for this issue. Its been going on for three months. Pt noticed a rash around trunk and hands, since three days ago. Now his son haves them. He needs note for work. He has all the meds he needs.   Review of Systems Add/anxiety    Objective:   Physical Exam  Constitutional: He is oriented to person, place, and time. He appears well-developed and well-nourished.  HENT:  Head: Normocephalic.  Eyes: EOM are normal.  Neck: Normal range of motion.  Pulmonary/Chest: Effort normal.  Musculoskeletal: Normal range of motion.  Neurological: He is alert and oriented to person, place, and time. No cranial nerve deficit. He exhibits normal muscle tone. Coordination normal.  Skin: Rash noted. Rash is maculopapular. There is erythema.             Assessment & Plan:  Recurrent/re exposed scabies Elemite/atarax

## 2013-09-22 NOTE — Progress Notes (Signed)
  Subjective:    Patient ID: Stephen Castro, male    DOB: [birth year], 22 y.o.   MRN: [id number]  HPI    Review of Systems     Objective:   Physical Exam        Assessment & Plan:

## 2013-09-23 ENCOUNTER — Telehealth: Payer: Self-pay

## 2013-09-23 NOTE — Telephone Encounter (Signed)
Wants to speak w/dr copland. Was seen yesterday by dr guest and rec'd a work note. He is not happy with the note and wanted to speak with dr copland. I let him know that dr copland would not be in until Monday and suggested that someone else might be able to help him.

## 2013-09-25 NOTE — Telephone Encounter (Signed)
Dr Patsy Lager can not change the note, it was provided by Dr Perrin Maltese. I called him, what problem does he have with the note? His mail box is full.

## 2013-09-27 NOTE — Telephone Encounter (Signed)
Called again. Mailbox full

## 2013-09-29 ENCOUNTER — Telehealth: Payer: Self-pay | Admitting: Radiology

## 2013-09-29 DIAGNOSIS — Z8619 Personal history of other infectious and parasitic diseases: Secondary | ICD-10-CM

## 2013-09-29 DIAGNOSIS — L299 Pruritus, unspecified: Secondary | ICD-10-CM

## 2013-09-29 NOTE — Telephone Encounter (Signed)
Patient has called again,(see previous) he is asking about his itching. He is upset Dr Perrin Maltese has advised him he may return to work following his scabies treatment. Advised him Dr Perrin Maltese released him, and he is able to return. I have advised him if he still has itching he may need dermatology referral. He would like message sent to Dr Patsy Lager, see notes, you did recommend the dermatology referral. He has been treated x3 for scabies continues to itch. Referral pended.

## 2013-09-29 NOTE — Telephone Encounter (Signed)
Still unable to reach

## 2013-09-29 NOTE — Telephone Encounter (Signed)
Advised patient after discussing with Dr Patsy Lager derm referral made, and Dr Patsy Lager can not change the work note.

## 2013-12-07 ENCOUNTER — Ambulatory Visit (INDEPENDENT_AMBULATORY_CARE_PROVIDER_SITE_OTHER): Payer: BC Managed Care – PPO | Admitting: Emergency Medicine

## 2013-12-07 ENCOUNTER — Ambulatory Visit
Admission: RE | Admit: 2013-12-07 | Discharge: 2013-12-07 | Disposition: A | Discharge status: Home / Self Care | Payer: BC Managed Care – PPO | Source: Ambulatory Visit | Attending: Emergency Medicine | Admitting: Emergency Medicine

## 2013-12-07 VITALS — BP 110/70 | HR 53 | Temp 97.5°F | Resp 16 | Ht 69.0 in | Wt 149.0 lb

## 2013-12-07 DIAGNOSIS — R51 Headache: Secondary | ICD-10-CM

## 2013-12-07 DIAGNOSIS — R110 Nausea: Secondary | ICD-10-CM

## 2013-12-07 MED ORDER — BUTALBITAL-APAP-CAFFEINE 50-500-40 MG PO TABS: 1.0000 | ORAL_TABLET | Freq: Four times a day (QID) | ORAL | Status: DC | PRN

## 2013-12-07 NOTE — Progress Notes (Addendum)
   Subjective:  This chart was scribed for Darlyne Russian, MD, by Neta Ehlers, ED Scribe.    Patient ID: Stephen Castro, male    DOB: [birth year], 23 y.o.   MRN: [id number]  HPI  Stephen Castro is a 23 y.o. male who presents to [facility] complaining of intermittent, parietal migraines which began a week ago. He reports the migraines are different from previous headaches stating the headaches have an intensity peak and then a gradual decline. The pt states the headaches prevented him from driving or sleeping. He rates the pain as 8/10. He has treated the headaches with Naproxen, IBU, and Tylenol without relief.   The pt also experienced nausea prior to developing the headaches. He treated the nausea with Tums and Florizine.   The pt states he recently stopped drinking Red Bull and nicotine; he used to drink one- two 12 oz Red Bulls a day and he is smoking two cigarettes a day rather than 1.5 packs.  He has increased his consumption of water.   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 He enjoys his job and his co-workers  Dr. Janett Billow Copland write the pt's prescription for Adderall.; he takes Adderall five days a week. The pt also takes Xanax as needed. He reports normally he takes Xanax a few times a month. He reports improvement to his anxiety symptoms.   Review of Systems  Constitutional: Negative for fatigue and unexpected weight change.  Eyes: Negative for visual disturbance.  Respiratory: Negative for cough, chest tightness and shortness of breath.   Cardiovascular: Negative for chest pain, palpitations and leg swelling.  Gastrointestinal: Negative for abdominal pain and blood in stool.  Neurological: Positive for headaches. Negative for dizziness and light-headedness.     Vitals: BP 110/70  Pulse 53  Temp(Src) 97.5 F (36.4 C) (Oral)  Resp 16  Ht 5\' 9"  (1.753 m)  Wt 149 lb (67.586 kg)  BMI 21.99 kg/m2  SpO2 98%     Objective:   Physical  Exam  CONSTITUTIONAL: Well developed/well nourished HEAD: Normocephalic/atraumatic EYES: EOMI/PERRL ENMT: Mucous membranes moist NECK: supple no meningeal signs SPINE:entire spine nontender CV: S1/S2 noted, no murmurs/rubs/gallops noted LUNGS: Lungs are clear to auscultation bilaterally, no apparent distress ABDOMEN: soft, nontender, no rebound or guarding GU:no cva tenderness NEURO: Pt is awake/alert, moves all extremitiesx4 cranial nerves II through XII are intact. Disc margins are sharp. There is no focal motor weakness noted. EXTREMITIES: pulses normal, full ROM SKIN: warm, color normal PSYCH: no abnormalities of mood noted       Assessment & Plan:  Patient presents with a one-week history of an 8/10 headache which waxes and wanes. He has had nausea with the headache but no active vomiting. He is trying to withdraw off of caffeine and cigarettes at the present time. This may be contributing to his escalating headaches. We'll check a CT head. The CT returned as normal. Will treat his headaches with Fioricet.

## 2013-12-07 NOTE — Patient Instructions (Signed)
[facility] [address] [phone] for CT Head w/o Contrast

## 2013-12-08 ENCOUNTER — Telehealth: Payer: Self-pay | Admitting: Emergency Medicine

## 2013-12-08 NOTE — Telephone Encounter (Signed)
Coaptation and be sure he knows his head CT is normal

## 2013-12-08 NOTE — Telephone Encounter (Signed)
Spoke with patient's mother. Gave results.

## 2013-12-25 ENCOUNTER — Other Ambulatory Visit: Payer: Self-pay

## 2013-12-25 ENCOUNTER — Ambulatory Visit: Payer: BC Managed Care – PPO

## 2013-12-25 DIAGNOSIS — F419 Anxiety disorder, unspecified: Secondary | ICD-10-CM

## 2013-12-25 DIAGNOSIS — F909 Attention-deficit hyperactivity disorder, unspecified type: Secondary | ICD-10-CM

## 2013-12-25 NOTE — Telephone Encounter (Signed)
Patient walked in to get refills: xanax and adderall.before he leaves for the [facility] for a week. Please call if able to refill without an office visit. Patient is unsure if he can wait due to the long wait in the lobby, he might stay to be seen. Patient sees Dr. Lorelei Pont.   [phone]

## 2013-12-25 NOTE — Telephone Encounter (Signed)
Patient's mother called and states that her son is in the lobby now waiting to be seen. States that he is unsure if he really even needs an OV to have his Xanax and Adderrall refilled. Patient is leaving to go to [place] for his job on Saturday and really needs these medications. Informed mother that it is really up to the provider whether he will need an OV or not to get these medications. Please let him or mom know.   Stephen Castro (mom) [phone]  [facility] [place]

## 2013-12-26 NOTE — Telephone Encounter (Signed)
Dr Lorelei Pont, do you want to RF or does pt need to be seen? I will pend one mos of each for review.

## 2013-12-26 NOTE — Telephone Encounter (Signed)
Called him back- I can do these RF for him; will have ready tomorrow.  Encouraged him to sign up for mychart.  He will let his mom know that we have talked

## 2013-12-27 MED ORDER — ALPRAZOLAM 0.5 MG PO TABS: ORAL_TABLET | ORAL | Status: DC

## 2013-12-27 MED ORDER — AMPHETAMINE-DEXTROAMPHETAMINE 10 MG PO TABS: ORAL_TABLET | ORAL | Status: DC

## 2014-01-19 ENCOUNTER — Ambulatory Visit (INDEPENDENT_AMBULATORY_CARE_PROVIDER_SITE_OTHER): Payer: BC Managed Care – PPO | Admitting: Family Medicine

## 2014-01-19 VITALS — BP 130/80 | HR 60 | Temp 98.0°F | Resp 16 | Ht 69.0 in | Wt 148.0 lb

## 2014-01-19 DIAGNOSIS — R630 Anorexia: Secondary | ICD-10-CM

## 2014-01-19 DIAGNOSIS — F909 Attention-deficit hyperactivity disorder, unspecified type: Secondary | ICD-10-CM

## 2014-01-19 DIAGNOSIS — R52 Pain, unspecified: Secondary | ICD-10-CM

## 2014-01-19 LAB — COMPREHENSIVE METABOLIC PANEL
ALT: 17 U/L (ref 0–53)
AST: 19 U/L (ref 0–37)
Albumin: 4.6 g/dL (ref 3.5–5.2)
Alkaline Phosphatase: 67 U/L (ref 39–117)
BUN: 14 mg/dL (ref 6–23)
CO2: 28 mEq/L (ref 19–32)
Calcium: 10.3 mg/dL (ref 8.4–10.5)
Chloride: 100 mEq/L (ref 96–112)
Creat: 1.12 mg/dL (ref 0.50–1.35)
Glucose, Bld: 68 mg/dL — ABNORMAL LOW (ref 70–99)
Potassium: 3.9 mEq/L (ref 3.5–5.3)
Sodium: 142 mEq/L (ref 135–145)
Total Bilirubin: 0.5 mg/dL (ref 0.2–1.2)
Total Protein: 7.5 g/dL (ref 6.0–8.3)

## 2014-01-19 LAB — POCT INFLUENZA A/B
Influenza A, POC: NEGATIVE
Influenza B, POC: NEGATIVE

## 2014-01-19 LAB — POCT CBC
Granulocyte percent: 48 %G (ref 37–80)
HCT, POC: 46.8 % (ref 43.5–53.7)
Hemoglobin: 15.1 g/dL (ref 14.1–18.1)
Lymph, poc: 3.3 (ref 0.6–3.4)
MCH, POC: 30.8 pg (ref 27–31.2)
MCHC: 32.3 g/dL (ref 31.8–35.4)
MCV: 95.6 fL (ref 80–97)
MID (cbc): 0.6 (ref 0–0.9)
MPV: 9.7 fL (ref 0–99.8)
POC Granulocyte: 3.6 (ref 2–6.9)
POC LYMPH PERCENT: 44.4 %L (ref 10–50)
POC MID %: 7.6 %M (ref 0–12)
Platelet Count, POC: 289 10*3/uL (ref 142–424)
RBC: 4.9 M/uL (ref 4.69–6.13)
RDW, POC: 13.9 %
WBC: 7.5 10*3/uL (ref 4.6–10.2)

## 2014-01-19 LAB — CK: Total CK: 156 U/L (ref 7–232)

## 2014-01-19 MED ORDER — AMPHETAMINE-DEXTROAMPHETAMINE 10 MG PO TABS: ORAL_TABLET | ORAL | Status: DC

## 2014-01-19 NOTE — Progress Notes (Addendum)
[facility] [address], [place] [address] [phone]  Date:  01/19/2014   Name:  Stephen Castro   DOB:  1991/04/27   MRN:  [id number]  PCP:  No PCP Per Patient    Chief Complaint: skin issues and muscles   History of Present Illness:  Stephen Castro is a 23 y.o. very pleasant male patient who presents with the following:  He is here today for a recheck. Recently he has suffered from persistent skin itching and rashes.  He is seeing Stephen Castro, dermatologist now as well.  He is taking zyrtec and benadryl right now for hives and itching.  He has continued to have problems with "hives and eczema."   This is taking a toll and adding to his stress.  He does not mention it but I am also aware of some problems in his parents' marriage.      He is still taking adderall, and his appetite is decreased.  He is feeling tired during the day and not sleeping well at night due to all his medications. He also notes "muscle and bone pain" for the last couple of days.   He has not noted a fever.  He has been exposed to the flu, but has not noted anything like a cough, runny nose.   He has been taking ibuprofen and tylenol as needed.    Wt Readings from Last 3 Encounters:  01/19/14 148 lb (67.132 kg)  12/07/13 149 lb (67.586 kg)  09/22/13 153 lb (69.4 kg)   He does admit to feeling stressed.  He has a new job. He is still together with his GF; this is a good realtionship but their jobs have cut into the time they are able to spend together.  Overall he knows he is sensitive to stress and that his skin sx may be at least partially stress related.   He would like to continue adderall; this does seem to help him concentrate on tasks, and he does not notice any particular SE.   Patient Active Problem List   Diagnosis Date Noted  . ADHD (attention deficit hyperactivity disorder) 04/04/2013  . Anxiety state, unspecified 04/04/2013    Past Medical History  Diagnosis Date  .  Depression   . Anxiety     History reviewed. No pertinent past surgical history.  History  Substance Use Topics  . Smoking status: Current Every Day Smoker  . Smokeless tobacco: Not on file  . Alcohol Use: No    History reviewed. No pertinent family history.  No Known Allergies  Medication list has been reviewed and updated.  Current Outpatient Prescriptions on File Prior to Visit  Medication Sig Dispense Refill  . ALPRAZolam (XANAX) 0.5 MG tablet Take one or two tablets daily as needed for panic attacks  30 tablet  0  . amphetamine-dextroamphetamine (ADDERALL) 10 MG tablet Take one or one and a half tablets twice daily  90 tablet  0  . butalbital-acetaminophen-caffeine (ESGIC PLUS) 50-500-40 MG per tablet Take 1 tablet by mouth every 6 (six) hours as needed for pain.  20 tablet  0  . amphetamine-dextroamphetamine (ADDERALL) 10 MG tablet Take one or one and a half tablets twice daily  90 tablet  0  . amphetamine-dextroamphetamine (ADDERALL) 10 MG tablet Take one or one and a half tablets twice daily  90 tablet  0  . hydrOXYzine (ATARAX/VISTARIL) 25 MG tablet Take 1 tablet (25 mg total) by mouth 3 (three)  times daily as needed for itching.  30 tablet  0   No current facility-administered medications on file prior to visit.    Review of Systems:  As per HPI- otherwise negative.   Physical Examination: Filed Vitals:   01/19/14 1059  BP: 130/80  Pulse: 60  Temp: 98 F (36.7 C)  Resp: 16   Filed Vitals:   01/19/14 1059  Height: 5\' 9"  (1.753 m)  Weight: 148 lb (67.132 kg)   Body mass index is 21.85 kg/(m^2). Ideal Body Weight: Weight in (lb) to have BMI = 25: 168.9  GEN: WDWN, NAD, Non-toxic, A & O x 3, looks well HEENT: Atraumatic, Normocephalic. Neck supple. No masses, No LAD.  Bilateral TM wnl, oropharynx normal.  PEERL,EOMI.   Ears and Nose: No external deformity. CV: RRR, No M/G/R. No JVD. No thrill. No extra heart sounds. PULM: CTA B, no wheezes, crackles,  rhonchi. No retractions. No resp. distress. No accessory muscle use. ABD: S, NT, ND, +BS. No rebound. No HSM. EXTR: No c/c/e NEURO Normal gait.  PSYCH: Normally interactive. Conversant. Not depressed or anxious appearing.  Calm demeanor.  He has some excoriated, irritated areas on his skin.  Most on the extensor surfaces of his arms.     Results for orders placed in visit on 01/19/14  POCT CBC      Result Value Ref Range   WBC 7.5  4.6 - 10.2 K/uL   Lymph, poc 3.3  0.6 - 3.4   POC LYMPH PERCENT 44.4  10 - 50 %L   MID (cbc) 0.6  0 - 0.9   POC MID % 7.6  0 - 12 %M   POC Granulocyte 3.6  2 - 6.9   Granulocyte percent 48.0  37 - 80 %G   RBC 4.90  4.69 - 6.13 M/uL   Hemoglobin 15.1  14.1 - 18.1 g/dL   HCT, POC 46.8  43.5 - 53.7 %   MCV 95.6  80 - 97 fL   MCH, POC 30.8  27 - 31.2 pg   MCHC 32.3  31.8 - 35.4 g/dL   RDW, POC 13.9     Platelet Count, POC 289  142 - 424 K/uL   MPV 9.7  0 - 99.8 fL  POCT INFLUENZA A/B      Result Value Ref Range   Influenza A, POC Negative     Influenza B, POC Negative      Assessment and Plan: Body aches - Plan: POCT CBC, POCT Influenza A/B, Comprehensive metabolic panel, CK  Poor appetite - Plan: TSH  ADHD (attention deficit hyperactivity disorder) - Plan: amphetamine-dextroamphetamine (ADDERALL) 10 MG tablet, amphetamine-dextroamphetamine (ADDERALL) 10 MG tablet, amphetamine-dextroamphetamine (ADDERALL) 10 MG tablet  Did a flu test.  Stephen Castro did not feel he could get through a blood draw without taking a xanax.  He returned a few hours later with his GF to drive him, after taking a xanax.  BW completed.  Will get in touch with him pending results.   Refilled his adderall for 3 months.    Signed Stephen Blinks, MD  Called 3/21: labs look good.  He will let me know if not feeling better soon Stephen Castro Results for orders placed in visit on 01/19/14  COMPREHENSIVE METABOLIC PANEL      Result Value Ref Range   Sodium 142  135 - 145 mEq/L   Potassium  3.9  3.5 - 5.3 mEq/L   Chloride 100  96 - 112 mEq/L   CO2 28  19 - 32 mEq/L   Glucose, Bld 68 (*) 70 - 99 mg/dL   BUN 14  6 - 23 mg/dL   Creat 1.12  0.50 - 1.35 mg/dL   Total Bilirubin 0.5  0.2 - 1.2 mg/dL   Alkaline Phosphatase 67  39 - 117 U/L   AST 19  0 - 37 U/L   ALT 17  0 - 53 U/L   Total Protein 7.5  6.0 - 8.3 g/dL   Albumin 4.6  3.5 - 5.2 g/dL   Calcium 10.3  8.4 - 10.5 mg/dL  CK      Result Value Ref Range   Total CK 156  7 - 232 U/L  TSH      Result Value Ref Range   TSH 1.316  0.350 - 4.500 uIU/mL  POCT CBC      Result Value Ref Range   WBC 7.5  4.6 - 10.2 K/uL   Lymph, poc 3.3  0.6 - 3.4   POC LYMPH PERCENT 44.4  10 - 50 %L   MID (cbc) 0.6  0 - 0.9   POC MID % 7.6  0 - 12 %M   POC Granulocyte 3.6  2 - 6.9   Granulocyte percent 48.0  37 - 80 %G   RBC 4.90  4.69 - 6.13 M/uL   Hemoglobin 15.1  14.1 - 18.1 g/dL   HCT, POC 46.8  43.5 - 53.7 %   MCV 95.6  80 - 97 fL   MCH, POC 30.8  27 - 31.2 pg   MCHC 32.3  31.8 - 35.4 g/dL   RDW, POC 13.9     Platelet Count, POC 289  142 - 424 K/uL   MPV 9.7  0 - 99.8 fL  POCT INFLUENZA A/B      Result Value Ref Range   Influenza A, POC Negative     Influenza B, POC Negative

## 2014-01-20 ENCOUNTER — Encounter: Payer: Self-pay | Admitting: Family Medicine

## 2014-01-20 LAB — TSH: TSH: 1.316 u[IU]/mL (ref 0.350–4.500)

## 2014-01-30 ENCOUNTER — Telehealth: Payer: Self-pay

## 2014-01-30 DIAGNOSIS — L509 Urticaria, unspecified: Secondary | ICD-10-CM

## 2014-01-30 MED ORDER — PREDNISONE 20 MG PO TABS: ORAL_TABLET | ORAL | Status: DC

## 2014-01-30 MED ORDER — EPINEPHRINE 0.3 MG/0.3ML IJ SOAJ: 0.3000 mg | Freq: Once | INTRAMUSCULAR | Status: DC

## 2014-01-30 NOTE — Telephone Encounter (Signed)
Called him back.  He states that he got a little bit better, but then got worse.  He has had hives on his back and neck.  He otherwise feels well, and does not have any fever.  He does not have any swelling of his face or mouth, no SOB.   He has been to see derm who added benadryl and zyrtec.  However since then his hives have again worsened,   Will rx prednisone and an epipen as he does not have one.  He will seek emergency care if he has any sign of angioedema.  Also gave rx for epipen to have on hand for emergency use.   He plans to come and see me when I am in the office on Thursday for a recheck

## 2014-01-30 NOTE — Telephone Encounter (Signed)
Pt states that he is still covered in hives, pt adds that he has been to his referral appt with Dr.Drew but still has had little to no relief. Please advise. Best# [phone]

## 2014-02-01 ENCOUNTER — Telehealth: Payer: Self-pay

## 2014-02-01 NOTE — Telephone Encounter (Signed)
Pt called stating he has seen Dr Lorelei Pont multiple times recently for skin issues and she rx'd prednisone. He states he just discovered his bed is covered in bed bugs and wants to know if he should continue the prednisone. States he is in the process of getting rid of his mattress and boxspring and washing all clothing.   Please call asap.  Best: [phone]  bf

## 2014-02-01 NOTE — Telephone Encounter (Signed)
Called and spoke to patient in regards to this. Per Dr. Lorelei Pont Advised patient to finish prednisone dosage and that the Bites would heel.  Advised him to let us know if he has any more problems . AC

## 2014-04-09 ENCOUNTER — Other Ambulatory Visit: Payer: Self-pay | Admitting: Family Medicine

## 2014-04-09 DIAGNOSIS — F410 Panic disorder [episodic paroxysmal anxiety] without agoraphobia: Secondary | ICD-10-CM

## 2014-04-10 NOTE — Telephone Encounter (Signed)
Called in.

## 2014-07-11 ENCOUNTER — Ambulatory Visit (INDEPENDENT_AMBULATORY_CARE_PROVIDER_SITE_OTHER): Payer: BC Managed Care – PPO | Admitting: Family Medicine

## 2014-07-11 VITALS — BP 126/74 | HR 58 | Temp 97.8°F | Resp 16 | Ht 69.0 in | Wt 142.6 lb

## 2014-07-11 DIAGNOSIS — F902 Attention-deficit hyperactivity disorder, combined type: Secondary | ICD-10-CM

## 2014-07-11 DIAGNOSIS — F909 Attention-deficit hyperactivity disorder, unspecified type: Secondary | ICD-10-CM

## 2014-07-11 DIAGNOSIS — J029 Acute pharyngitis, unspecified: Secondary | ICD-10-CM

## 2014-07-11 DIAGNOSIS — F410 Panic disorder [episodic paroxysmal anxiety] without agoraphobia: Secondary | ICD-10-CM

## 2014-07-11 LAB — POCT RAPID STREP A (OFFICE): Rapid Strep A Screen: NEGATIVE

## 2014-07-11 MED ORDER — IPRATROPIUM BROMIDE 0.03 % NA SOLN: 2.0000 | Freq: Four times a day (QID) | NASAL | Status: DC

## 2014-07-11 MED ORDER — ALPRAZOLAM 0.5 MG PO TABS: ORAL_TABLET | ORAL | Status: DC

## 2014-07-11 MED ORDER — AMPHETAMINE-DEXTROAMPHETAMINE 10 MG PO TABS: ORAL_TABLET | ORAL | Status: DC

## 2014-07-11 NOTE — Patient Instructions (Signed)
It looks like you do have a cold or allergy causing your sore throat.  Try the atrovent nasal spray, and ibuprofen/ tylenol as needed.  Continue to use your adderall and xanax as needed.  Have a great time on your trip!

## 2014-07-11 NOTE — Progress Notes (Signed)
[facility] [address], [place] [address] [phone]  Date:  07/11/2014   Name:  Stephen Castro   DOB:  1991-01-12   MRN:  [id number]  PCP:  No PCP Per Patient    Chief Complaint: Medication Refill   History of Present Illness:  Stephen Castro is a 23 y.o. very pleasant male patient who presents with the following:  He is here today for medication refills.  He is going to [place] for the weekend with his GF for a music festival.  Overall he is doing well.  He is working, things are good with his family and GF.   He takes adderall when he needs it, but does not take it on the weekends.  He uses xanax as needed, and maybe needs it once or twice a week.  It helps him to handle his anxiety.    Yesterday he awoke with a ST.  He felt better in this regard, but now notes sinus congestion.  He is not coughing.  He is sneezing, and does have some runny nose.  No fever. Wants to make sure it is not anything more serious due to his upcoming trip Patient Active Problem List   Diagnosis Date Noted  . ADHD (attention deficit hyperactivity disorder) 04/04/2013  . Anxiety state, unspecified 04/04/2013    Past Medical History  Diagnosis Date  . Depression   . Anxiety     History reviewed. No pertinent past surgical history.  History  Substance Use Topics  . Smoking status: Current Every Day Smoker  . Smokeless tobacco: Not on file  . Alcohol Use: No     Comment: Stopped drinking a year and a half ago    History reviewed. No pertinent family history.  No Known Allergies  Medication list has been reviewed and updated.  Current Outpatient Prescriptions on File Prior to Visit  Medication Sig Dispense Refill  . ALPRAZolam (XANAX) 0.5 MG tablet TAKE 1 OR 2 TABLETS BY MOUTH EVERY DAY AS NEEDED FOR PANIC ATTACKS  30 tablet  0  . amphetamine-dextroamphetamine (ADDERALL) 10 MG tablet Take one or one and a half tablets twice daily  90 tablet  0  .  amphetamine-dextroamphetamine (ADDERALL) 10 MG tablet Take one or one and a half tablets twice daily  90 tablet  0  . amphetamine-dextroamphetamine (ADDERALL) 10 MG tablet Take one or one and a half tablets twice daily  90 tablet  0   No current facility-administered medications on file prior to visit.    Review of Systems:  As per HPI- otherwise negative.   Physical Examination: Filed Vitals:   07/11/14 1046  BP: 126/74  Pulse: 58  Temp: 97.8 F (36.6 C)  Resp: 16   Filed Vitals:   07/11/14 1046  Height: 5\' 9"  (1.753 m)  Weight: 142 lb 9.6 oz (64.683 kg)   Body mass index is 21.05 kg/(m^2). Ideal Body Weight: Weight in (lb) to have BMI = 25: 168.9  GEN: WDWN, NAD, Non-toxic, A & O x 3, slim build, looks well HEENT: Atraumatic, Normocephalic. Neck supple. No masses, No LAD.  Bilateral TM wnl, oropharynx erythematous but no exudate.  PEERL,EOMI.   Ears and Nose: No external deformity. CV: RRR, No M/G/R. No JVD. No thrill. No extra heart sounds. PULM: CTA B, no wheezes, crackles, rhonchi. No retractions. No resp. distress. No accessory muscle use. EXTR: No c/c/e NEURO Normal gait.  PSYCH: Normally interactive. Conversant. Not depressed or anxious appearing.  Calm demeanor.   Results for orders placed in visit on 07/11/14  POCT RAPID STREP A (OFFICE)      Result Value Ref Range   Rapid Strep A Screen Negative  Negative    Assessment and Plan: Attention deficit hyperactivity disorder (ADHD), combined type - Plan: amphetamine-dextroamphetamine (ADDERALL) 10 MG tablet, amphetamine-dextroamphetamine (ADDERALL) 10 MG tablet, amphetamine-dextroamphetamine (ADDERALL) 10 MG tablet  Panic attacks - Plan: ALPRAZolam (XANAX) 0.5 MG tablet  Acute pharyngitis, unspecified pharyngitis type - Plan: POCT rapid strep A, ipratropium (ATROVENT) 0.03 % nasal spray  Doing well with adderall and xanax.  ST likely due to allergies or viral illness. Will try atrovent nasal as claritin, etc  seem to make him drowsy.  He will let me know if not better!   Signed Lamar Blinks, MD

## 2014-07-19 ENCOUNTER — Ambulatory Visit (INDEPENDENT_AMBULATORY_CARE_PROVIDER_SITE_OTHER): Payer: BC Managed Care – PPO | Admitting: Family Medicine

## 2014-07-19 VITALS — BP 122/86 | HR 69 | Temp 97.7°F | Resp 16 | Ht 68.25 in | Wt 143.4 lb

## 2014-07-19 DIAGNOSIS — J208 Acute bronchitis due to other specified organisms: Secondary | ICD-10-CM

## 2014-07-19 DIAGNOSIS — J209 Acute bronchitis, unspecified: Secondary | ICD-10-CM

## 2014-07-19 MED ORDER — CEFDINIR 300 MG PO CAPS: 300.0000 mg | ORAL_CAPSULE | Freq: Two times a day (BID) | ORAL | Status: DC

## 2014-07-19 MED ORDER — ALBUTEROL SULFATE HFA 108 (90 BASE) MCG/ACT IN AERS: 2.0000 | INHALATION_SPRAY | Freq: Four times a day (QID) | RESPIRATORY_TRACT | Status: DC | PRN

## 2014-07-19 NOTE — Patient Instructions (Signed)
We are going to add an albuterol inhaler as needed for cough and congestion.  Also take the [facility] antibiotic as directed.  Let me know if you do not feel better in the next few days.

## 2014-07-19 NOTE — Progress Notes (Signed)
[facility] [address] [place] [address] [phone]  Date:  07/19/2014   Name:  Stephen Castro   DOB:  1991-03-09   MRN:  [id number]  PCP:  No PCP Per Patient    Chief Complaint: Follow-up   History of Present Illness:  Stephen Castro is a 23 y.o. very pleasant male patient who presents with the following:  Here today to follow-up illness.  He was here on 9/9 with a ST; negative strep, tried atrovent nasal for his sx.  They went on a trip over the weekend- he had fun , but seems to have develop chest congestion as well.  He had been "only able to whisper" for a few days.   He still has some ST, some wheezing, he is coughing up mucus.  He is not taking mucinex DM.  He is bringing up thick mucus.  He feels tired and achy.  He has not noted a fever, but his GF has seemed to have a fever perhaps.    Patient Active Problem List   Diagnosis Date Noted  . ADHD (attention deficit hyperactivity disorder) 04/04/2013  . Anxiety state, unspecified 04/04/2013    Past Medical History  Diagnosis Date  . Depression   . Anxiety     History reviewed. No pertinent past surgical history.  History  Substance Use Topics  . Smoking status: Current Every Day Smoker  . Smokeless tobacco: Not on file  . Alcohol Use: No     Comment: Stopped drinking a year and a half ago    History reviewed. No pertinent family history.  No Known Allergies  Medication list has been reviewed and updated.  Current Outpatient Prescriptions on File Prior to Visit  Medication Sig Dispense Refill  . ALPRAZolam (XANAX) 0.5 MG tablet TAKE 1 OR 2 TABLETS BY MOUTH EVERY DAY AS NEEDED FOR PANIC ATTACKS  30 tablet  1  . amphetamine-dextroamphetamine (ADDERALL) 10 MG tablet Take one or one and a half tablets twice daily  90 tablet  0  . amphetamine-dextroamphetamine (ADDERALL) 10 MG tablet Take one or one and a half tablets twice daily  90 tablet  0  . amphetamine-dextroamphetamine (ADDERALL)  10 MG tablet Take one or one and a half tablets twice daily  90 tablet  0  . ipratropium (ATROVENT) 0.03 % nasal spray Place 2 sprays into the nose 4 (four) times daily.  30 mL  6   No current facility-administered medications on file prior to visit.    Review of Systems:  As per HPI- otherwise negative.   Physical Examination: Filed Vitals:   07/19/14 1437  BP: 122/86  Pulse: 69  Temp: 97.7 F (36.5 C)  Resp: 16   Filed Vitals:   07/19/14 1437  Height: 5' 8.25" (1.734 m)  Weight: 143 lb 6.4 oz (65.046 kg)   Body mass index is 21.63 kg/(m^2). Ideal Body Weight: Weight in (lb) to have BMI = 25: 165.3  GEN: WDWN, NAD, Non-toxic, A & O x 3, looks well HEENT: Atraumatic, Normocephalic. Neck supple. No masses, No LAD.  Bilateral TM wnl, oropharynx normal.  PEERL,EOMI.  Nasal congestion Ears and Nose: No external deformity. CV: RRR, No M/G/R. No JVD. No thrill. No extra heart sounds. PULM: CTA B, no wheezes, crackles, rhonchi. No retractions. No resp. distress. No accessory muscle use. EXTR: No c/c/e NEURO Normal gait.  PSYCH: Normally interactive. Conversant. Not depressed or anxious appearing.  Calm demeanor.    Assessment and  Plan: Acute bronchitis due to other specified organisms - Plan: albuterol (PROVENTIL HFA;VENTOLIN HFA) 108 (90 BASE) MCG/ACT inhaler, cefdinir (OMNICEF) 300 MG capsule  Persistent sx of chest congestion now here for the 2nd time.  Will go ahead and treat for bronchitis with omnicef, and he will let me know if not better in the next few days- Sooner if worse.     Signed Lamar Blinks, MD

## 2015-03-12 ENCOUNTER — Ambulatory Visit (INDEPENDENT_AMBULATORY_CARE_PROVIDER_SITE_OTHER): Payer: BLUE CROSS/BLUE SHIELD | Admitting: Emergency Medicine

## 2015-03-12 VITALS — BP 142/70 | HR 67 | Temp 97.9°F | Resp 16 | Ht 69.5 in | Wt 149.9 lb

## 2015-03-12 DIAGNOSIS — M7022 Olecranon bursitis, left elbow: Secondary | ICD-10-CM | POA: Diagnosis not present

## 2015-03-12 MED ORDER — SULFAMETHOXAZOLE-TRIMETHOPRIM 800-160 MG PO TABS: 1.0000 | ORAL_TABLET | Freq: Two times a day (BID) | ORAL | Status: DC

## 2015-03-12 MED ORDER — NAPROXEN SODIUM 550 MG PO TABS: 550.0000 mg | ORAL_TABLET | Freq: Two times a day (BID) | ORAL | Status: DC

## 2015-03-12 NOTE — Patient Instructions (Signed)
Olecranon Bursitis Bursitis is swelling and soreness (inflammation) of a fluid-filled sac (bursa) that covers and protects a joint. Olecranon bursitis occurs over the elbow.  CAUSES Bursitis can be caused by injury, overuse of the joint, arthritis, or infection.  SYMPTOMS   Tenderness, swelling, warmth, or redness over the elbow.  Elbow pain with movement. This is greater with bending the elbow.  Squeaking sound when the bursa is rubbed or moved.  Increasing size of the bursa without pain or discomfort.  Fever with increasing pain and swelling if the bursa becomes infected. HOME CARE INSTRUCTIONS   Put ice on the affected area.  Put ice in a plastic bag.  Place a towel between your skin and the bag.  Leave the ice on for 15-20 minutes each hour while awake. Do this for the first 2 days.  When resting, elevate your elbow above the level of your heart. This helps reduce swelling.  Continue to put the joint through a full range of motion 4 times per day. Rest the injured joint at other times. When the pain lessens, begin normal slow movements and usual activities.  Only take over-the-counter or prescription medicines for pain, discomfort, or fever as directed by your caregiver.  Reduce your intake of milk and related dairy products (cheese, yogurt). They may make your condition worse. SEEK IMMEDIATE MEDICAL CARE IF:   Your pain increases even during treatment.  You have a fever.  You have heat and inflammation over the bursa and elbow.  You have a red line that goes up your arm.  You have pain with movement of your elbow. MAKE SURE YOU:   Understand these instructions.  Will watch your condition.  Will get help right away if you are not doing well or get worse. Document Released: 11/18/2006 Document Revised: 01/11/2012 Document Reviewed: 10/04/2007 ExitCare Patient Information 2015 [employer]. This information is not intended to replace advice given to you by your  health care provider. Make sure you discuss any questions you have with your health care provider.  

## 2015-03-12 NOTE — Progress Notes (Signed)
[facility] [address] [place] [address] [phone]  Date:  03/12/2015   Name:  Lasalle Abee   DOB:  01/27/1991   MRN:  [id number]  PCP:  No PCP Per Patient    Chief Complaint: Elbow Pain and Edema   History of Present Illness:  Eris Breck is a 24 y.o. very pleasant male patient who presents with the following:  Patient stuck a cactus spine in his left elbow two weeks ago and believes he removed the entire spine Yesterday he developed redness, tenderness and swelling of the olecranon.   Says was the size of a tennis ball. No history of more recent injury or overuse. No fever or chills No improvement with over the counter medications or other home remedies.  Denies other complaint or health concern today.   Patient Active Problem List   Diagnosis Date Noted  . ADHD (attention deficit hyperactivity disorder) 04/04/2013  . Anxiety state, unspecified 04/04/2013    Past Medical History  Diagnosis Date  . Depression   . Anxiety     History reviewed. No pertinent past surgical history.  History  Substance Use Topics  . Smoking status: Current Every Day Smoker  . Smokeless tobacco: Not on file  . Alcohol Use: No     Comment: Stopped drinking a year and a half ago    History reviewed. No pertinent family history.  No Known Allergies  Medication list has been reviewed and updated.  Current Outpatient Prescriptions on File Prior to Visit  Medication Sig Dispense Refill  . albuterol (PROVENTIL HFA;VENTOLIN HFA) 108 (90 BASE) MCG/ACT inhaler Inhale 2 puffs into the lungs every 6 (six) hours as needed for wheezing or shortness of breath. 1 Inhaler 0  . ALPRAZolam (XANAX) 0.5 MG tablet TAKE 1 OR 2 TABLETS BY MOUTH EVERY DAY AS NEEDED FOR PANIC ATTACKS 30 tablet 1  . amphetamine-dextroamphetamine (ADDERALL) 10 MG tablet Take one or one and a half tablets twice daily 90 tablet 0  . amphetamine-dextroamphetamine (ADDERALL) 10 MG tablet Take one  or one and a half tablets twice daily 90 tablet 0  . amphetamine-dextroamphetamine (ADDERALL) 10 MG tablet Take one or one and a half tablets twice daily 90 tablet 0  . cefdinir (OMNICEF) 300 MG capsule Take 1 capsule (300 mg total) by mouth 2 (two) times daily. (Patient not taking: Reported on 03/12/2015) 20 capsule 0  . ipratropium (ATROVENT) 0.03 % nasal spray Place 2 sprays into the nose 4 (four) times daily. (Patient not taking: Reported on 03/12/2015) 30 mL 6   No current facility-administered medications on file prior to visit.    Review of Systems:  Review of Systems  Constitutional: Negative for fever, chills and fatigue.  HENT: Negative for congestion, ear pain, hearing loss, postnasal drip, rhinorrhea and sinus pressure.   Eyes: Negative for discharge and redness.  Respiratory: Negative for cough, shortness of breath and wheezing.   Cardiovascular: Negative for chest pain and leg swelling.  Gastrointestinal: Negative for nausea, vomiting, abdominal pain, constipation and blood in stool.  Genitourinary: Negative for dysuria, urgency and frequency.  Musculoskeletal: Negative for neck stiffness.  Skin: Negative for rash.  Neurological: Negative for seizures, weakness and headaches.   Physical Examination: Filed Vitals:   03/12/15 1055  BP: 142/70  Pulse: 67  Temp: 97.9 F (36.6 C)  Resp: 16   Filed Vitals:   03/12/15 1055  Height: 5' 9.5" (1.765 m)  Weight: 149 lb 14.4 oz (67.994 kg)  Body mass index is 21.83 kg/(m^2). Ideal Body Weight: Weight in (lb) to have BMI = 25: 171.4   GEN: WDWN, NAD, Non-toxic, Alert & Oriented x 3 HEENT: Atraumatic, Normocephalic.  Ears and Nose: No external deformity. EXTR: No clubbing/cyanosis/edema NEURO: Normal gait.  PSYCH: Normally interactive. Conversant. Not depressed or anxious appearing.  Calm demeanor.  LEFT elbow:  Tender with mild swelling and redness.  [place] mobile.  No visible wound or foreign body  Assessment and  Plan: 1. Olecranon bursitis of left elbow Possible retained foreign body Will treat for presumed infection Local heat Follow up if not improved - sulfamethoxazole-trimethoprim (BACTRIM DS,SEPTRA DS) 800-160 MG per tablet; Take 1 tablet by mouth 2 (two) times daily.  Dispense: 20 tablet; Refill: 0 - naproxen sodium (ANAPROX DS) 550 MG tablet; Take 1 tablet (550 mg total) by mouth 2 (two) times daily with a meal.  Dispense: 40 tablet; Refill: 0    Signed Ellison Carwin, MD

## 2015-05-29 ENCOUNTER — Telehealth: Payer: Self-pay

## 2015-05-29 NOTE — Telephone Encounter (Signed)
PATIENT'S MOTHER (ANGELA) WOULD LIKE DR. COPLAND TO CALL HER BACK AS SOON AS POSSIBLE. [place]'S PSYCHIATRIST TOLD HER TO CALL HIS PRIMARY CARE DOCTOR BEFORE HE STARTS A TREATMENT. SHE WOULD LIKE TO SPEAK WITH DR. Lorelei Pont DIRECTLY. SHE CONSIDERS DR. Lorelei Pont HIS DOCTOR. BEST PHONE [phone] (MOTHER'S NAME IS ANGELA Ilic) [place]

## 2015-05-30 NOTE — Telephone Encounter (Signed)
Called and LMOM as I did not get her.  I am uncomfortable not going directly to Stephen Castro, he is 60 and of sound mind.  Asked her to please have him call me if he has any concerns. I am happy to speak with him

## 2015-09-27 ENCOUNTER — Other Ambulatory Visit (HOSPITAL_COMMUNITY): Payer: Self-pay | Admitting: Psychiatry

## 2015-09-28 ENCOUNTER — Ambulatory Visit (INDEPENDENT_AMBULATORY_CARE_PROVIDER_SITE_OTHER): Payer: BLUE CROSS/BLUE SHIELD | Admitting: Family Medicine

## 2015-09-28 VITALS — BP 126/70 | HR 81 | Temp 98.5°F | Resp 16 | Ht 69.5 in | Wt 150.8 lb

## 2015-09-28 DIAGNOSIS — F411 Generalized anxiety disorder: Secondary | ICD-10-CM

## 2015-09-28 MED ORDER — ALPRAZOLAM ER 2 MG PO TB24: 2.0000 mg | ORAL_TABLET | ORAL | Status: DC

## 2015-09-28 NOTE — Patient Instructions (Signed)
Go back on your usual alprazolam 2mg  and follow-up with Dr. Christie Beckers when you can.   Let me know if your symptoms of withdrawal do not go away with going back on your medication

## 2015-09-28 NOTE — Progress Notes (Signed)
[facility] [address] [place] [address] [phone]  Date:  09/28/2015   Name:  Stephen Castro   DOB:  [birth year]   MRN:  [id number]  PCP:  No PCP Per Patient    Chief Complaint: Medication Refill   History of Present Illness:  Stephen Castro is a 24 y.o. very pleasant male patient who presents with the following:  Here today to follow-up; last seen by myself over a year ago.   I last gave him adderall #3 rx 07/2014 to fill over the subsequent 3 months.  Since then he has been seeing Dr. Casimiro Needle for these needs.  Since then it looks like Dr. Casimiro Needle has been treating him with xanax ER 2mg - #30 per month. He last filled this on 10/22.  He thought that he had enough of this, but then he did not have any refills and could not reach anyone at his MD office due to holiday weekend.  He feels like he may be starting to withdraw from the xanax and wanted to know if I could refill this for him He has been out for 24 hours now except he did take one of his old 0.5 immediate release pills yesterday No vomiting, but he feels "a little bit strange. I feel like I zone out," he feels warm   Patient Active Problem List   Diagnosis Date Noted  . ADHD (attention deficit hyperactivity disorder) 04/04/2013  . Anxiety state, unspecified 04/04/2013    Past Medical History  Diagnosis Date  . Depression   . Anxiety     History reviewed. No pertinent past surgical history.  Social History  Substance Use Topics  . Smoking status: Current Every Day Smoker  . Smokeless tobacco: None  . Alcohol Use: No     Comment: Stopped drinking a year and a half ago    History reviewed. No pertinent family history.  No Known Allergies  Medication list has been reviewed and updated.  Current Outpatient Prescriptions on File Prior to Visit  Medication Sig Dispense Refill  . ALPRAZolam (XANAX) 0.5 MG tablet TAKE 1 OR 2 TABLETS BY MOUTH EVERY DAY AS NEEDED FOR PANIC ATTACKS 30  tablet 1  . amphetamine-dextroamphetamine (ADDERALL) 10 MG tablet Take one or one and a half tablets twice daily 90 tablet 0  . amphetamine-dextroamphetamine (ADDERALL) 10 MG tablet Take one or one and a half tablets twice daily 90 tablet 0  . amphetamine-dextroamphetamine (ADDERALL) 10 MG tablet Take one or one and a half tablets twice daily 90 tablet 0  . albuterol (PROVENTIL HFA;VENTOLIN HFA) 108 (90 BASE) MCG/ACT inhaler Inhale 2 puffs into the lungs every 6 (six) hours as needed for wheezing or shortness of breath. (Patient not taking: Reported on 09/28/2015) 1 Inhaler 0  . cefdinir (OMNICEF) 300 MG capsule Take 1 capsule (300 mg total) by mouth 2 (two) times daily. (Patient not taking: Reported on 03/12/2015) 20 capsule 0  . ipratropium (ATROVENT) 0.03 % nasal spray Place 2 sprays into the nose 4 (four) times daily. (Patient not taking: Reported on 03/12/2015) 30 mL 6  . naproxen sodium (ANAPROX DS) 550 MG tablet Take 1 tablet (550 mg total) by mouth 2 (two) times daily with a meal. (Patient not taking: Reported on 09/28/2015) 40 tablet 0  . sulfamethoxazole-trimethoprim (BACTRIM DS,SEPTRA DS) 800-160 MG per tablet Take 1 tablet by mouth 2 (two) times daily. (Patient not taking: Reported on 09/28/2015) 20 tablet 0   No current  facility-administered medications on file prior to visit.    Review of Systems:  As per HPI- otherwise negative.   Physical Examination: Filed Vitals:   09/28/15 1352  BP: 126/70  Pulse: 81  Temp: 98.5 F (36.9 C)  Resp: 16   Filed Vitals:   09/28/15 1352  Height: 5' 9.5" (1.765 m)  Weight: 150 lb 12.8 oz (68.402 kg)   Body mass index is 21.96 kg/(m^2). Ideal Body Weight: Weight in (lb) to have BMI = 25: 171.4  GEN: WDWN, NAD, Non-toxic, A & O x 3, looks well HEENT: Atraumatic, Normocephalic. Neck supple. No masses, No LAD. Ears and Nose: No external deformity. CV: RRR, No M/G/R. No JVD. No thrill. No extra heart sounds. PULM: CTA B, no wheezes,  crackles, rhonchi. No retractions. No resp. distress. No accessory muscle use. EXTR: No c/c/e NEURO Normal gait.  PSYCH: Normally interactive. Conversant. Not depressed or anxious appearing.  Calm demeanor.  His palms are slightly moist and he has a fine tremor when hands are extended    Assessment and Plan: GAD (generalized anxiety disorder) - Plan: ALPRAZolam (XANAX XR) 2 MG 24 hr tablet refilled his xanax- he has been on this long term and should not stop abruptly. No sign of dangerous withdrawal sx but asked him to seek care if not feeling better very soon   Signed Lamar Blinks, MD

## 2015-12-27 ENCOUNTER — Other Ambulatory Visit: Payer: Self-pay | Admitting: Family Medicine

## 2015-12-28 NOTE — Telephone Encounter (Signed)
Patient has been seen by Dr. Casimiro Needle for his Adderall and Xanax refills. I let him know that Dr. Lorelei Pont was no longer working here and since she was not managing his ADD and anxiety attacks here, I recommended he continue his care with Dr. Casimiro Needle. Patient verbalized understanding.

## 2016-01-24 ENCOUNTER — Telehealth: Payer: Self-pay | Admitting: Family Medicine

## 2016-01-24 NOTE — Telephone Encounter (Signed)
Received VM from Stephen Castro to reschedule appt from 02/03/16 - tried calling back at [phone] phone # that was left on VM - VM not setup - also called [phone] the ph # that was on the VM as the phone # the call came from but it is a workplace.

## 2016-02-03 ENCOUNTER — Ambulatory Visit: Payer: Self-pay | Admitting: Family Medicine

## 2016-02-10 DIAGNOSIS — F901 Attention-deficit hyperactivity disorder, predominantly hyperactive type: Secondary | ICD-10-CM | POA: Diagnosis not present

## 2016-02-12 ENCOUNTER — Telehealth: Payer: Self-pay | Admitting: *Deleted

## 2016-02-12 NOTE — Telephone Encounter (Signed)
Unable to reach patient at time of pre-visit call. Voicemail not set up, unable to leave message.

## 2016-02-13 ENCOUNTER — Encounter: Payer: Self-pay | Admitting: Family Medicine

## 2016-02-13 ENCOUNTER — Ambulatory Visit (INDEPENDENT_AMBULATORY_CARE_PROVIDER_SITE_OTHER): Payer: BLUE CROSS/BLUE SHIELD | Admitting: Family Medicine

## 2016-02-13 VITALS — BP 130/70 | HR 79 | Temp 97.3°F | Ht 70.0 in | Wt 150.8 lb

## 2016-02-13 DIAGNOSIS — F902 Attention-deficit hyperactivity disorder, combined type: Secondary | ICD-10-CM

## 2016-02-13 DIAGNOSIS — F411 Generalized anxiety disorder: Secondary | ICD-10-CM | POA: Diagnosis not present

## 2016-02-13 DIAGNOSIS — S93401A Sprain of unspecified ligament of right ankle, initial encounter: Secondary | ICD-10-CM

## 2016-02-13 DIAGNOSIS — Z91048 Other nonmedicinal substance allergy status: Secondary | ICD-10-CM | POA: Diagnosis not present

## 2016-02-13 DIAGNOSIS — Z9109 Other allergy status, other than to drugs and biological substances: Secondary | ICD-10-CM

## 2016-02-13 MED ORDER — AMPHETAMINE-DEXTROAMPHETAMINE 10 MG PO TABS: ORAL_TABLET | ORAL | Status: DC

## 2016-02-13 MED ORDER — ALBUTEROL SULFATE HFA 108 (90 BASE) MCG/ACT IN AERS: 2.0000 | INHALATION_SPRAY | Freq: Four times a day (QID) | RESPIRATORY_TRACT | Status: DC | PRN

## 2016-02-13 NOTE — Progress Notes (Signed)
[place] [facility] [address] [place], [place] [address] [phone]  Date:  02/13/2016   Name:  Stephen Castro   DOB:  [birth year]   MRN:  [id number]  PCP:  Stephen Blinks, MD    Chief Complaint: New Patient (Initial Visit)   History of Present Illness:  Stephen Castro is a 25 y.o. very pleasant male patient who presents with the following:  History of adhd and anxiety. He notes a little pain in the posterior ankle for a couple of days- NKI but he is concerned that he could develop a sprained ankle at one of the music festivals he likes to attend.   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 They are also parenting her 14 yo son from a previous relationship.  He is taking xanax 2mg  XR per Dr. Casimiro Castro which is really helping with his panic attacks.    He sees his psychologist every week or so for counseling He does take adderall still when he needs it for work.  He does not fill that very often.   He did have labs in the last year or so  He does use albuterol sometimes for environmental allergies.    Patient Active Problem List   Diagnosis Date Noted  . ADHD (attention deficit hyperactivity disorder) 04/04/2013  . Anxiety state, unspecified 04/04/2013    Past Medical History  Diagnosis Date  . Depression   . Anxiety     No past surgical history on file.  Social History  Substance Use Topics  . Smoking status: Current Every Day Smoker  . Smokeless tobacco: None  . Alcohol Use: No     Comment: Stopped drinking a year and a half ago    No family history on file.  No Known Allergies  Medication list has been reviewed and updated.  Current Outpatient Prescriptions on File Prior to Visit  Medication Sig Dispense Refill  . albuterol (PROVENTIL HFA;VENTOLIN HFA) 108 (90 BASE) MCG/ACT inhaler Inhale 2 puffs into  the lungs every 6 (six) hours as needed for wheezing or shortness of breath. 1 Inhaler 0  . ALPRAZolam (XANAX XR) 2 MG 24 hr tablet Take 1 tablet (2 mg total) by mouth every morning. 30 tablet 0  . amphetamine-dextroamphetamine (ADDERALL) 10 MG tablet Take one or one and a half tablets twice daily 90 tablet 0  . amphetamine-dextroamphetamine (ADDERALL) 10 MG tablet Take one or one and a half tablets twice daily 90 tablet 0  . amphetamine-dextroamphetamine (ADDERALL) 10 MG tablet Take one or one and a half tablets twice daily 90 tablet 0   No current facility-administered medications on file prior to visit.    Review of Systems:  As per HPI- otherwise negative.   Physical Examination: Filed Vitals:   02/13/16 1431  BP: 130/70  Pulse: 79  Temp: 97.3 F (36.3 C)   Filed Vitals:   02/13/16 1431  Height: 5\' 10"  (1.778 m)  Weight: 150 lb 12.8 oz (68.402 kg)   Body mass index is 21.64 kg/(m^2). Ideal Body Weight: Weight in (lb) to have BMI = 25: 173.9  GEN: WDWN, NAD, Non-toxic, A & O x 3, looks well, slim build HEENT: Atraumatic, Normocephalic. Neck supple. No masses, No LAD. Ears and Nose: No external deformity. CV: RRR, No M/G/R. No JVD. No thrill. No extra heart sounds.  PULM: CTA B, no wheezes, crackles, rhonchi. No retractions. No resp. distress. No accessory muscle use. EXTR: No c/c/e NEURO Normal gait.  PSYCH: Normally interactive. Conversant. Not depressed or anxious appearing.  Calm demeanor.  Right ankle: slight tenderness at the peroneus longus lateral ankle.  No swelling or redness, normal ROM   Assessment and Plan: Acute bronchitis due to other specified organisms - Plan: albuterol (PROVENTIL HFA;VENTOLIN HFA) 108 (90 Base) MCG/ACT inhaler  GAD (generalized anxiety disorder)  Attention deficit hyperactivity disorder (ADHD), combined type - Plan: amphetamine-dextroamphetamine (ADDERALL) 10 MG tablet  Sprain of ankle, right, initial encounter  Mild ankle sprain-  reassured that this will likely do well, use a lace up ankle support at festival if still tender in June He will continue to see psychiatry for his xanax I did refill his adderall today Albuterol refilled- to use as needed for occasional wheezing associated with environmental allergies   Signed Stephen Blinks, MD

## 2016-02-13 NOTE — Patient Instructions (Signed)
It was nice to see you today!  Take care and let me know when you need more of the adderall Let's plan to recheck in 6 months If your ankle continues to bother you try a lace-up ankle brace for support

## 2016-02-13 NOTE — Progress Notes (Signed)
Pre visit review using our clinic review tool, if applicable. No additional management support is needed unless otherwise documented below in the visit note. 

## 2016-02-24 DIAGNOSIS — F901 Attention-deficit hyperactivity disorder, predominantly hyperactive type: Secondary | ICD-10-CM | POA: Diagnosis not present

## 2016-03-02 DIAGNOSIS — F901 Attention-deficit hyperactivity disorder, predominantly hyperactive type: Secondary | ICD-10-CM | POA: Diagnosis not present

## 2016-03-10 DIAGNOSIS — F4322 Adjustment disorder with anxiety: Secondary | ICD-10-CM | POA: Diagnosis not present

## 2016-03-10 DIAGNOSIS — F901 Attention-deficit hyperactivity disorder, predominantly hyperactive type: Secondary | ICD-10-CM | POA: Diagnosis not present

## 2016-03-16 DIAGNOSIS — F901 Attention-deficit hyperactivity disorder, predominantly hyperactive type: Secondary | ICD-10-CM | POA: Diagnosis not present

## 2016-03-23 DIAGNOSIS — F901 Attention-deficit hyperactivity disorder, predominantly hyperactive type: Secondary | ICD-10-CM | POA: Diagnosis not present

## 2016-03-31 DIAGNOSIS — F901 Attention-deficit hyperactivity disorder, predominantly hyperactive type: Secondary | ICD-10-CM | POA: Diagnosis not present

## 2016-04-06 DIAGNOSIS — F901 Attention-deficit hyperactivity disorder, predominantly hyperactive type: Secondary | ICD-10-CM | POA: Diagnosis not present

## 2016-04-16 DIAGNOSIS — F901 Attention-deficit hyperactivity disorder, predominantly hyperactive type: Secondary | ICD-10-CM | POA: Diagnosis not present

## 2016-04-20 DIAGNOSIS — F4322 Adjustment disorder with anxiety: Secondary | ICD-10-CM | POA: Diagnosis not present

## 2016-04-29 ENCOUNTER — Telehealth: Payer: Self-pay | Admitting: Family Medicine

## 2016-04-29 NOTE — Telephone Encounter (Signed)
°  Relationship to patient: Self  Can be reached: [phone]   Reason for call: Request refill on amphetamine-dextroamphetamine (ADDERALL) 10 MG tablet [id number]

## 2016-04-30 DIAGNOSIS — F901 Attention-deficit hyperactivity disorder, predominantly hyperactive type: Secondary | ICD-10-CM | POA: Diagnosis not present

## 2016-05-01 ENCOUNTER — Other Ambulatory Visit: Payer: Self-pay | Admitting: Emergency Medicine

## 2016-05-01 ENCOUNTER — Other Ambulatory Visit: Payer: BLUE CROSS/BLUE SHIELD

## 2016-05-01 ENCOUNTER — Other Ambulatory Visit: Payer: Self-pay | Admitting: Family Medicine

## 2016-05-01 ENCOUNTER — Telehealth: Payer: Self-pay | Admitting: Family Medicine

## 2016-05-01 DIAGNOSIS — F902 Attention-deficit hyperactivity disorder, combined type: Secondary | ICD-10-CM

## 2016-05-01 MED ORDER — AMPHETAMINE-DEXTROAMPHETAMINE 10 MG PO TABS: ORAL_TABLET | ORAL | Status: DC

## 2016-05-01 NOTE — Telephone Encounter (Signed)
-----   Message from Oneta Rack sent at 05/01/2016  2:31 PM EDT ----- Caller name:Angela Toy Cookey  Relation to [id number]  Call back number:[phone]  Reason for call:  Mother would like to speak with you regarding previous coversation

## 2016-05-01 NOTE — Telephone Encounter (Signed)
Patient called very upset that he did not receive a call back from provider stated he called 3 days prior for a refill. He was told at the time of the request he would have a response in 48 hours and when he called back in was informed that no one had responded to his message. Patient states he spoke to [place] both times and was frustrated that she did not help him. Patient did however receive a call after mychart request in submitted today. He stated he feel this was a big break down in communication. I apologized for the patients unconvinced and assured him we would work to ensure he does not have this experience again. Patient seemed ok at the end of the call he will pick up rx later today

## 2016-05-01 NOTE — Telephone Encounter (Signed)
Pt requested refill on Adderall. Last seen on 02/13/2016.  Contract printed, No UDS on file. Pt coming by the office today to pick up rx.

## 2016-05-01 NOTE — Telephone Encounter (Signed)
Called pt's mother back. Son came to pick up rx but left after being asked to leave UDS. Pt thought he was required to leave blood work in order to get the rx. Mother states that he has been on Adderall for years and has never had to do this before. I informed the patient's mother that blood work is not required. Pt will only need to leave a urine sample in order to get his rx.  Also inform pt's mother that this is a Water quality scientist for any patient taking a controlled substance. Pt will come back to the office to pick up Adderall rx and will leave a urine sample for UDS.

## 2016-05-01 NOTE — Telephone Encounter (Signed)
Patient came to pick up Rx for Adderral on 05/01/2016. Informed patient that he needed to do a UDS before it could be released. Patient started playing with his phone and asked how long it would take, then stated he was on his lunch break and his boss was texting him telling him to leave the office now and return to work. Stated he was going to the lobby to make a phone call to his boss to see if he could have a few more minutes to do the UDS. Patient never returned to do the UDS. Rx placed at front desk with note that he needs a UDS before receiving.

## 2016-05-07 ENCOUNTER — Encounter: Payer: Self-pay | Admitting: Family Medicine

## 2016-05-07 ENCOUNTER — Ambulatory Visit (INDEPENDENT_AMBULATORY_CARE_PROVIDER_SITE_OTHER): Payer: BLUE CROSS/BLUE SHIELD | Admitting: Family Medicine

## 2016-05-07 VITALS — BP 136/84 | HR 98 | Temp 98.5°F | Ht 70.0 in | Wt 150.2 lb

## 2016-05-07 DIAGNOSIS — F902 Attention-deficit hyperactivity disorder, combined type: Secondary | ICD-10-CM | POA: Diagnosis not present

## 2016-05-07 MED ORDER — AMPHETAMINE-DEXTROAMPHETAMINE 10 MG PO TABS: ORAL_TABLET | ORAL | Status: DC

## 2016-05-07 NOTE — Patient Instructions (Signed)
UDS today please Good to see you today- take care and please contact me in about 3 months when refills are due

## 2016-05-07 NOTE — Progress Notes (Signed)
Pre visit review using our clinic review tool, if applicable. No additional management support is needed unless otherwise documented below in the visit note. 

## 2016-05-07 NOTE — Progress Notes (Signed)
[place] at [facility] [address] [place], [place] [address] [phone]  Date:  05/07/2016   Name:  Stephen Castro   DOB:  May 05, 1991   MRN:  [id number]  PCP:  Lamar Blinks, MD    Chief Complaint: Follow-up   History of Present Illness:  Stephen Castro is a 25 y.o. very pleasant male patient who presents with the following:  Here today for a medication follow-up [facility] shows only alprazolam rx monthly from Dr. Casimiro Needle.  Last seen by myself and given adderall on 02/13/16.  Most recent rx for same 05/01/2016 by Dr. Etter Sjogren in my absence.   Decided with his psychiatrist to taper off xanax. He stopped taking it completely about 2 weeks ago and is taking wellbutrin 150 BID.  He did notice some withdrawal from the xanax but this is now over, and he feels like overall he is doing well.    He notes that he does not get tired as much but he is able to get to sleep pretty well.  Appetite is ok, and he does not feel like smoking so much.    He plans to use his adderall regularly now for his ADHD.   He has taken 10 mg a day for the last 3 weeks and feels like his sx are under good control He had been on up to 30 mg a day in the past but does not currently feels like he needs that much  However he does anticipate going up to maybe 20 mg a day in the next month.   Patient Active Problem List   Diagnosis Date Noted  . ADHD (attention deficit hyperactivity disorder) 04/04/2013  . Anxiety state, unspecified 04/04/2013    Past Medical History  Diagnosis Date  . Depression   . Anxiety     No past surgical history on file.  Social History  Substance Use Topics  . Smoking status: Current Every Day Smoker  . Smokeless tobacco: None  . Alcohol Use: No     Comment: Stopped drinking a year and a half ago    No family history on file.  No Known Allergies  Medication list has been reviewed and updated.  Current Outpatient  Prescriptions on File Prior to Visit  Medication Sig Dispense Refill  . albuterol (PROVENTIL HFA;VENTOLIN HFA) 108 (90 Base) MCG/ACT inhaler Inhale 2 puffs into the lungs every 6 (six) hours as needed for wheezing or shortness of breath. 1 Inhaler 0  . amphetamine-dextroamphetamine (ADDERALL) 10 MG tablet Take one or one and a half tablets twice daily 90 tablet 0  . amphetamine-dextroamphetamine (ADDERALL) 10 MG tablet Take one or one and a half tablets twice daily 90 tablet 0  . amphetamine-dextroamphetamine (ADDERALL) 10 MG tablet Take one or one and a half tablets twice daily 90 tablet 0   No current facility-administered medications on file prior to visit.    Review of Systems:  As per HPI- otherwise negative.   Physical Examination: Filed Vitals:   05/07/16 1529  BP: 136/84  Pulse: 98  Temp: 98.5 F (36.9 C)   Filed Vitals:   05/07/16 1529  Height: 5\' 10"  (1.778 m)  Weight: 150 lb 3.2 oz (68.13 kg)   Body mass index is 21.55 kg/(m^2). Ideal Body Weight: Weight in (lb) to have BMI = 25: 173.9  GEN: WDWN, NAD, Non-toxic, A & O x 3, thin build, looks well HEENT: Atraumatic, Normocephalic. Neck supple. No  masses, No LAD.  He notes a milia in the medial corner of the left eye.  Offered to remove for him but he prefers to observe for now Ears and Nose: No external deformity. CV: RRR, No M/G/R. No JVD. No thrill. No extra heart sounds. PULM: CTA B, no wheezes, crackles, rhonchi. No retractions. No resp. distress. No accessory muscle use. EXTR: No c/c/e NEURO Normal gait.  PSYCH: Normally interactive. Conversant. Not depressed or anxious appearing.  Calm demeanor.    Assessment and Plan: Attention deficit hyperactivity disorder (ADHD), combined type - Plan: amphetamine-dextroamphetamine (ADDERALL) 10 MG tablet, amphetamine-dextroamphetamine (ADDERALL) 10 MG tablet  Refilled adderall for august and September for him today He will give [place] a UDS today Wrote from #60 adderall  10 mg as he may increase back to 20 mg a day.  However do not think he needs to continue to get #90 as he is not taking this amount   Meds ordered this encounter  Medications  . buPROPion (WELLBUTRIN XL) 150 MG 24 hr tablet    Sig: Take 2 tablets by mouth daily.    Refill:  4  . amphetamine-dextroamphetamine (ADDERALL) 10 MG tablet    Sig: Take up to 2 tablets daily    Dispense:  60 tablet    Refill:  0    Ok to fill 07/17/2016  . amphetamine-dextroamphetamine (ADDERALL) 10 MG tablet    Sig: Take up to 2 tablets daily    Dispense:  60 tablet    Refill:  0    Ok to fill 06/16/2016     Signed Lamar Blinks, MD

## 2016-05-08 ENCOUNTER — Telehealth: Payer: Self-pay | Admitting: Family Medicine

## 2016-05-08 ENCOUNTER — Other Ambulatory Visit: Payer: Self-pay | Admitting: Family Medicine

## 2016-05-08 DIAGNOSIS — Z79899 Other long term (current) drug therapy: Secondary | ICD-10-CM | POA: Diagnosis not present

## 2016-05-08 NOTE — Telephone Encounter (Signed)
°  Relationship to patient: Self  Can be reached: [phone]   Reason for call: Patient called stating that he was given Rx for August and September instead of July and August. Request to have Rx for July because he has not had his medication and really needs it.

## 2016-05-14 ENCOUNTER — Encounter: Payer: Self-pay | Admitting: Family Medicine

## 2016-05-14 DIAGNOSIS — F901 Attention-deficit hyperactivity disorder, predominantly hyperactive type: Secondary | ICD-10-CM | POA: Diagnosis not present

## 2016-05-15 ENCOUNTER — Encounter: Payer: Self-pay | Admitting: Family Medicine

## 2016-05-15 NOTE — Telephone Encounter (Signed)
error:[id number] ° °

## 2016-05-21 DIAGNOSIS — F901 Attention-deficit hyperactivity disorder, predominantly hyperactive type: Secondary | ICD-10-CM | POA: Diagnosis not present

## 2016-05-25 DIAGNOSIS — F901 Attention-deficit hyperactivity disorder, predominantly hyperactive type: Secondary | ICD-10-CM | POA: Diagnosis not present

## 2016-05-26 ENCOUNTER — Encounter: Payer: Self-pay | Admitting: Family Medicine

## 2016-06-04 ENCOUNTER — Other Ambulatory Visit (HOSPITAL_COMMUNITY): Payer: Self-pay | Admitting: Psychiatry

## 2016-06-04 DIAGNOSIS — F901 Attention-deficit hyperactivity disorder, predominantly hyperactive type: Secondary | ICD-10-CM | POA: Diagnosis not present

## 2016-06-08 DIAGNOSIS — F901 Attention-deficit hyperactivity disorder, predominantly hyperactive type: Secondary | ICD-10-CM | POA: Diagnosis not present

## 2016-06-18 DIAGNOSIS — F901 Attention-deficit hyperactivity disorder, predominantly hyperactive type: Secondary | ICD-10-CM | POA: Diagnosis not present

## 2016-06-22 DIAGNOSIS — F901 Attention-deficit hyperactivity disorder, predominantly hyperactive type: Secondary | ICD-10-CM | POA: Diagnosis not present

## 2016-07-07 DIAGNOSIS — F901 Attention-deficit hyperactivity disorder, predominantly hyperactive type: Secondary | ICD-10-CM | POA: Diagnosis not present

## 2016-07-13 DIAGNOSIS — F901 Attention-deficit hyperactivity disorder, predominantly hyperactive type: Secondary | ICD-10-CM | POA: Diagnosis not present

## 2016-07-20 DIAGNOSIS — F901 Attention-deficit hyperactivity disorder, predominantly hyperactive type: Secondary | ICD-10-CM | POA: Diagnosis not present

## 2016-08-17 DIAGNOSIS — F901 Attention-deficit hyperactivity disorder, predominantly hyperactive type: Secondary | ICD-10-CM | POA: Diagnosis not present

## 2016-08-24 DIAGNOSIS — F901 Attention-deficit hyperactivity disorder, predominantly hyperactive type: Secondary | ICD-10-CM | POA: Diagnosis not present

## 2016-09-07 DIAGNOSIS — F901 Attention-deficit hyperactivity disorder, predominantly hyperactive type: Secondary | ICD-10-CM | POA: Diagnosis not present

## 2016-09-14 DIAGNOSIS — F901 Attention-deficit hyperactivity disorder, predominantly hyperactive type: Secondary | ICD-10-CM | POA: Diagnosis not present

## 2016-09-22 DIAGNOSIS — F901 Attention-deficit hyperactivity disorder, predominantly hyperactive type: Secondary | ICD-10-CM | POA: Diagnosis not present

## 2016-09-28 DIAGNOSIS — F901 Attention-deficit hyperactivity disorder, predominantly hyperactive type: Secondary | ICD-10-CM | POA: Diagnosis not present

## 2016-10-08 DIAGNOSIS — F901 Attention-deficit hyperactivity disorder, predominantly hyperactive type: Secondary | ICD-10-CM | POA: Diagnosis not present

## 2016-10-19 DIAGNOSIS — F901 Attention-deficit hyperactivity disorder, predominantly hyperactive type: Secondary | ICD-10-CM | POA: Diagnosis not present

## 2016-11-09 DIAGNOSIS — F901 Attention-deficit hyperactivity disorder, predominantly hyperactive type: Secondary | ICD-10-CM | POA: Diagnosis not present

## 2017-11-11 ENCOUNTER — Ambulatory Visit: Payer: BLUE CROSS/BLUE SHIELD | Admitting: Nurse Practitioner

## 2017-11-11 ENCOUNTER — Encounter: Payer: Self-pay | Admitting: Nurse Practitioner

## 2017-11-11 VITALS — BP 142/86 | HR 76 | Temp 98.2°F | Ht 70.0 in | Wt 153.0 lb

## 2017-11-11 DIAGNOSIS — J0140 Acute pansinusitis, unspecified: Secondary | ICD-10-CM

## 2017-11-11 DIAGNOSIS — J029 Acute pharyngitis, unspecified: Secondary | ICD-10-CM | POA: Diagnosis not present

## 2017-11-11 LAB — POCT RAPID STREP A (OFFICE): Rapid Strep A Screen: NEGATIVE

## 2017-11-11 MED ORDER — OXYMETAZOLINE HCL 0.05 % NA SOLN: 1.0000 | Freq: Two times a day (BID) | NASAL | 0 refills | Status: DC

## 2017-11-11 MED ORDER — BENZONATATE 100 MG PO CAPS: 100.0000 mg | ORAL_CAPSULE | Freq: Three times a day (TID) | ORAL | 0 refills | Status: DC | PRN

## 2017-11-11 MED ORDER — AMOXICILLIN-POT CLAVULANATE 875-125 MG PO TABS: 1.0000 | ORAL_TABLET | Freq: Two times a day (BID) | ORAL | 0 refills | Status: DC

## 2017-11-11 MED ORDER — FLUTICASONE PROPIONATE 50 MCG/ACT NA SUSP
2.0000 | Freq: Every day | NASAL | 0 refills | Status: DC
Start: 2017-11-11 — End: 2018-01-20

## 2017-11-11 MED ORDER — DM-GUAIFENESIN ER 30-600 MG PO TB12
1.0000 | ORAL_TABLET | Freq: Two times a day (BID) | ORAL | 0 refills | Status: DC | PRN
Start: 2017-11-11 — End: 2018-01-20

## 2017-11-11 NOTE — Patient Instructions (Signed)
[facility] Instructions: Flonase and Afrin use: apply 1spray of afrin in each nare, wait 72mins, then apply 2sprays of flonase in each nare. Use both nasal spray consecutively x 3days, then flonase only for at least 14days.  Encourage adequate oral hydration.  Return to office if no improvement in 2weeks

## 2017-11-11 NOTE — Progress Notes (Signed)
Subjective:  Patient ID: Stephen Castro, male    DOB: 16-Feb-1991  Age: 27 y.o. MRN: [id number]  CC: Nasal Congestion (head congestion,sore throat,cant sleep,joints pain going on for 1 and a half week. took OTC--didnt help. )  Sinusitis  This is a new problem. The current episode started 1 to 4 weeks ago. The problem has been waxing and waning since onset. Associated symptoms include chills, congestion, coughing, ear pain, headaches, a hoarse voice, sinus pressure, a sore throat and swollen glands. Pertinent negatives include no shortness of breath. Past treatments include oral decongestants. The treatment provided mild relief.  Sore Throat   This is a new problem. The current episode started 1 to 4 weeks ago. The problem has been waxing and waning. There has been no fever. The pain is moderate. Associated symptoms include congestion, coughing, ear pain, headaches, a hoarse voice, a plugged ear sensation and swollen glands. Pertinent negatives include no diarrhea, drooling, shortness of breath, stridor or trouble swallowing. He has had exposure to mono. He has had no exposure to strep. He has tried NSAIDs and acetaminophen for the symptoms. The treatment provided mild relief.   Outpatient Medications Prior to Visit  Medication Sig Dispense Refill  . albuterol (PROVENTIL HFA;VENTOLIN HFA) 108 (90 Base) MCG/ACT inhaler Inhale 2 puffs into the lungs every 6 (six) hours as needed for wheezing or shortness of breath. 1 Inhaler 0  . buPROPion (WELLBUTRIN XL) 150 MG 24 hr tablet Take 2 tablets by mouth daily.  4  . amphetamine-dextroamphetamine (ADDERALL) 10 MG tablet Take one or one and a half tablets twice daily (Patient not taking: Reported on 11/11/2017) 90 tablet 0  . amphetamine-dextroamphetamine (ADDERALL) 10 MG tablet Take up to 2 tablets daily (Patient not taking: Reported on 11/11/2017) 60 tablet 0  . amphetamine-dextroamphetamine (ADDERALL) 10 MG tablet Take up to 2 tablets daily (Patient not  taking: Reported on 11/11/2017) 60 tablet 0   No facility-administered medications prior to visit.     ROS See HPI  Objective:  BP (!) 142/86   Pulse 76   Temp 98.2 F (36.8 C) (Oral)   Ht 5\' 10"  (1.778 m)   Wt 153 lb (69.4 kg)   SpO2 97%   BMI 21.95 kg/m   BP Readings from Last 3 Encounters:  11/11/17 (!) 142/86  05/07/16 136/84  02/13/16 130/70    Wt Readings from Last 3 Encounters:  11/11/17 153 lb (69.4 kg)  05/07/16 150 lb 3.2 oz (68.1 kg)  02/13/16 150 lb 12.8 oz (68.4 kg)    Physical Exam  Constitutional: He is oriented to person, place, and time. No distress.  HENT:  Right Ear: Tympanic membrane, external ear and ear canal normal.  Left Ear: Tympanic membrane and ear canal normal.  Nose: Mucosal edema and rhinorrhea present. Right sinus exhibits maxillary sinus tenderness and frontal sinus tenderness. Left sinus exhibits maxillary sinus tenderness and frontal sinus tenderness.  Mouth/Throat: Uvula is midline. Oropharyngeal exudate and posterior oropharyngeal erythema present.  Eyes: No scleral icterus.  Neck: Normal range of motion. Neck supple.  Cardiovascular: Normal rate and regular rhythm.  Pulmonary/Chest: Effort normal and breath sounds normal.  Lymphadenopathy:    He has cervical adenopathy.  Neurological: He is alert and oriented to person, place, and time.  Skin: Skin is warm and dry. No rash noted. No erythema.  Psychiatric: He has a normal mood and affect. His behavior is normal.  Vitals reviewed.   Lab Results  Component Value Date  WBC 7.5 01/19/2014   HGB 15.1 01/19/2014   HCT 46.8 01/19/2014   GLUCOSE 68 (L) 01/19/2014   ALT 17 01/19/2014   AST 19 01/19/2014   NA 142 01/19/2014   K 3.9 01/19/2014   CL 100 01/19/2014   CREATININE 1.12 01/19/2014   BUN 14 01/19/2014   CO2 28 01/19/2014   TSH 1.316 01/19/2014    Ct Head Wo Contrast  Result Date: 12/07/2013 CLINICAL DATA:  Severe headache EXAM: CT HEAD WITHOUT CONTRAST TECHNIQUE:  Contiguous axial images were obtained from the base of the skull through the vertex without intravenous contrast. COMPARISON:  None. FINDINGS: Ventricle size is normal. Negative for acute infarct or edema. No hemorrhage or mass. The dural sinuses are slightly denser than usual however are symmetric and similar in the sagittal sinus and transverse sinus. The circle Willis also is slightly dense suggesting dehydration or high hematocrit. No strong evidence for sinus thrombosis. Visualized paranasal sinuses are clear. IMPRESSION: Negative Electronically Signed   By: Stephen Castro M.D.   On: 12/07/2013 14:01    Assessment & Plan:   Stephen Castro was seen today for nasal congestion.  Diagnoses and all orders for this visit:  Acute non-recurrent pansinusitis -     amoxicillin-clavulanate (AUGMENTIN) 875-125 MG tablet; Take 1 tablet by mouth 2 (two) times daily. -     fluticasone (FLONASE) 50 MCG/ACT nasal spray; Place 2 sprays into both nostrils daily. -     oxymetazoline (AFRIN NASAL SPRAY) 0.05 % nasal spray; Place 1 spray into both nostrils 2 (two) times daily. Use only for 3days, then stop -     dextromethorphan-guaiFENesin (MUCINEX DM) 30-600 MG 12hr tablet; Take 1 tablet by mouth 2 (two) times daily as needed for cough. -     benzonatate (TESSALON) 100 MG capsule; Take 1 capsule (100 mg total) by mouth 3 (three) times daily as needed for cough.  Acute pharyngitis, unspecified etiology -     POCT rapid strep A -     amoxicillin-clavulanate (AUGMENTIN) 875-125 MG tablet; Take 1 tablet by mouth 2 (two) times daily. -     fluticasone (FLONASE) 50 MCG/ACT nasal spray; Place 2 sprays into both nostrils daily. -     oxymetazoline (AFRIN NASAL SPRAY) 0.05 % nasal spray; Place 1 spray into both nostrils 2 (two) times daily. Use only for 3days, then stop -     dextromethorphan-guaiFENesin (MUCINEX DM) 30-600 MG 12hr tablet; Take 1 tablet by mouth 2 (two) times daily as needed for cough. -     benzonatate  (TESSALON) 100 MG capsule; Take 1 capsule (100 mg total) by mouth 3 (three) times daily as needed for cough.   I have discontinued Stephen Castro's amphetamine-dextroamphetamine, amphetamine-dextroamphetamine, and amphetamine-dextroamphetamine. I am also having him start on amoxicillin-clavulanate, fluticasone, oxymetazoline, dextromethorphan-guaiFENesin, and benzonatate. Additionally, I am having him maintain his albuterol and buPROPion.  Meds ordered this encounter  Medications  . amoxicillin-clavulanate (AUGMENTIN) 875-125 MG tablet    Sig: Take 1 tablet by mouth 2 (two) times daily.    Dispense:  20 tablet    Refill:  0    Order Specific Question:   Supervising Provider    Answer:   Lucille Passy [[id number]]  . fluticasone (FLONASE) 50 MCG/ACT nasal spray    Sig: Place 2 sprays into both nostrils daily.    Dispense:  16 g    Refill:  0    Order Specific Question:   Supervising Provider    Answer:  Lucille Passy [[id number]]  . oxymetazoline (AFRIN NASAL SPRAY) 0.05 % nasal spray    Sig: Place 1 spray into both nostrils 2 (two) times daily. Use only for 3days, then stop    Dispense:  30 mL    Refill:  0    Order Specific Question:   Supervising Provider    Answer:   Lucille Passy [[id number]]  . dextromethorphan-guaiFENesin (MUCINEX DM) 30-600 MG 12hr tablet    Sig: Take 1 tablet by mouth 2 (two) times daily as needed for cough.    Dispense:  14 tablet    Refill:  0    Order Specific Question:   Supervising Provider    Answer:   Lucille Passy [[id number]]  . benzonatate (TESSALON) 100 MG capsule    Sig: Take 1 capsule (100 mg total) by mouth 3 (three) times daily as needed for cough.    Dispense:  20 capsule    Refill:  0    Order Specific Question:   Supervising Provider    Answer:   Lucille Passy [[id number]]    Follow-up: Return if symptoms worsen or fail to improve.  Wilfred Lacy, NP

## 2018-01-20 ENCOUNTER — Ambulatory Visit: Payer: BLUE CROSS/BLUE SHIELD | Admitting: Family Medicine

## 2018-01-20 ENCOUNTER — Ambulatory Visit (HOSPITAL_BASED_OUTPATIENT_CLINIC_OR_DEPARTMENT_OTHER)
Admission: RE | Admit: 2018-01-20 | Discharge: 2018-01-20 | Disposition: A | Discharge status: Home / Self Care | Payer: BLUE CROSS/BLUE SHIELD | Source: Ambulatory Visit | Attending: Family Medicine | Admitting: Family Medicine

## 2018-01-20 ENCOUNTER — Encounter: Payer: Self-pay | Admitting: Family Medicine

## 2018-01-20 VITALS — BP 145/88 | HR 71 | Resp 16 | Ht 70.0 in | Wt 151.2 lb

## 2018-01-20 DIAGNOSIS — X58XXXA Exposure to other specified factors, initial encounter: Secondary | ICD-10-CM | POA: Insufficient documentation

## 2018-01-20 DIAGNOSIS — S43402A Unspecified sprain of left shoulder joint, initial encounter: Secondary | ICD-10-CM

## 2018-01-20 DIAGNOSIS — S4982XA Other specified injuries of left shoulder and upper arm, initial encounter: Secondary | ICD-10-CM | POA: Insufficient documentation

## 2018-01-20 DIAGNOSIS — M25512 Pain in left shoulder: Secondary | ICD-10-CM

## 2018-01-20 MED ORDER — MELOXICAM 7.5 MG PO TABS: 7.5000 mg | ORAL_TABLET | Freq: Every day | ORAL | 0 refills | Status: DC

## 2018-01-20 NOTE — Patient Instructions (Addendum)
Please go to imaging on the ground floor to have x-rays of your shoulder.  I will be in touch with your reports later today We will set you up with sports med for next week to ensure you are healing properly Use the sling for comfort and support- however do be sure to do pendulum swings and forearm rotations as we discussed.  Ice, mobic rx as needed for pain

## 2018-01-20 NOTE — Progress Notes (Signed)
[place] [facility] [address], [place], [place] [address] [phone]  Date:  01/20/2018   Name:  Stephen Castro   DOB:  10/26/1991   MRN:  [id number]  PCP:  Darreld Mclean, MD    Chief Complaint: Shoulder Pain (Left shoulder pain, dog pulled arm out of socket, knot on shoulder)   History of Present Illness:  Stephen Castro is a 27 y.o. very pleasant male patient who presents with the following:  Here today with a left shoulder injury  Was in a chaotic situation yesterday when his roommate became very ill suddenly and needed help.  While trying to get the dogs organized during this emergency one of his dogs pulled very hard on his left arm - he had a hold of his collar with his left hand He felt like the shoulder "came out of socket and then went back in."  He wanted to come in today and make sure all is well, the shoulder is still sore.  However it is not deformed in appearance and he is able to use his arm He is otherwise unhurt  Never had any true dislocation of his shoulder per his description- never had to seek medical care in any case   Patient Active Problem List   Diagnosis Date Noted  . ADHD (attention deficit hyperactivity disorder) 04/04/2013  . Anxiety state, unspecified 04/04/2013    Past Medical History:  Diagnosis Date  . Anxiety   . Depression     No past surgical history on file.  Social History   Tobacco Use  . Smoking status: Current Every Day Smoker  . Smokeless tobacco: Current User  Substance Use Topics  . Alcohol use: No    Comment: Stopped drinking a year and a half ago  . Drug use: No    No family history on file.  No Known Allergies  Medication list has been reviewed and updated.  Current Outpatient Medications on File Prior to Visit  Medication Sig Dispense Refill  . albuterol (PROVENTIL HFA;VENTOLIN HFA) 108 (90 Base) MCG/ACT inhaler Inhale 2 puffs into the lungs  every 6 (six) hours as needed for wheezing or shortness of breath. 1 Inhaler 0  . buPROPion (WELLBUTRIN XL) 150 MG 24 hr tablet Take 2 tablets by mouth daily.  4   No current facility-administered medications on file prior to visit.     Review of Systems:  As per HPI- otherwise negative. Otherwise feeling well today He is having pain in his shoulder not relieved by OTC ibuprofen so far   Physical Examination: Vitals:   01/20/18 1555  BP: (!) 145/88  Pulse: 71  Resp: 16  SpO2: 100%   Vitals:   01/20/18 1555  Weight: 151 lb 3.2 oz (68.6 kg)  Height: 5\' 10"  (1.778 m)   Body mass index is 21.69 kg/m. Ideal Body Weight: Weight in (lb) to have BMI = 25: 173.9  GEN: WDWN, NAD, Non-toxic, A & O x 3, looks well, normal weight HEENT: Atraumatic, Normocephalic. Neck supple. No masses, No LAD. Ears and Nose: No external deformity. CV: RRR, No M/G/R. No JVD. No thrill. No extra heart sounds. PULM: CTA B, no wheezes, crackles, rhonchi. No retractions. No resp. distress. No accessory muscle use. EXTR: No c/c/e NEURO Normal gait.  PSYCH: Normally interactive. Conversant. Not depressed or anxious appearing.  Calm demeanor.  Left shoulder: at this time seems to be well in joint  and does not appear to be dislocated at this time.  No swelling, redness or heat Good strength of biceps, deltoid and rotator cuff structures Normal ROM except he does have discomfort with full abduction Normal sensation and DTR of both arms Not tender at SI joint   Assessment and Plan: Sprain of left shoulder, unspecified shoulder sprain type, initial encounter - Plan: DG Shoulder Left  Acute pain of left shoulder - Plan: meloxicam (MOBIC) 7.5 MG tablet  Here today with left shoulder injury.  He may have subluxed shoulder vs a simple strain.  Will obtain x-rays today, plan to have him see sports med next week  Given a sling to use prn for comfort but cautioned about stiffness- he will do pendulum swings and  forearm ROM several times a day mobic to use prn  BP Readings from Last 3 Encounters:  01/20/18 (!) 145/88  11/11/17 (!) 142/86  05/07/16 136/84     Signed Lamar Blinks, MD  Received films Dg Shoulder Left  Result Date: 01/20/2018 CLINICAL DATA:  Left shoulder pain and limited range of motion after being jerked by a dog on a leash. EXAM: LEFT SHOULDER - 2+ VIEW COMPARISON:  None. FINDINGS: There is no evidence of fracture or dislocation. There is no evidence of arthropathy or other focal bone abnormality. Soft tissues are unremarkable. IMPRESSION: Normal examination. Electronically Signed   By: Claudie Revering M.D.   On: 01/20/2018 16:40   Message to pt- negative

## 2018-01-24 ENCOUNTER — Ambulatory Visit: Payer: BLUE CROSS/BLUE SHIELD | Admitting: Family Medicine

## 2018-01-24 ENCOUNTER — Encounter: Payer: Self-pay | Admitting: Family Medicine

## 2018-01-24 DIAGNOSIS — S4992XA Unspecified injury of left shoulder and upper arm, initial encounter: Secondary | ICD-10-CM

## 2018-01-24 NOTE — Patient Instructions (Signed)
You subluxed your shoulder (near-dislocation). Wear sling for 1 more week then discontinue using. Arm circles and swings 3 sets of 10 once or twice a day. You also want to make sure you're doing elbow flexion/extension twice a day so this doesn't get stiff while you're using the sling. Icing 15 minutes at a time 3-4 times a day. Meloxicam daily with food for pain and inflammation. I'd recommend against lifting more than 5 pounds with this arm or lifting at or above shoulder level until I see you back. Follow up with me in 2 weeks for reevaluation. Plan to start strengthening exercises, possibly physical therapy at that time.

## 2018-01-24 NOTE — Assessment & Plan Note (Signed)
Independently reviewed radiographs and no evidence of fracture, Hill-Sachs lesion, current dislocation.  Patient's history and exam are consistent with shoulder subluxation.  Advised that he wear the sling for 1 more week he will come out of this twice a day to do shoulder and elbow motion exercises which were reviewed.  Icing, Tylenol.  Encouraged to pick up the meloxicam and start taking this daily with food.  Recommended against lifting more than 5 pounds with this arm or lifting over the shoulder.  Also advised against abduction and external rotation as this motion will put him at greatest risk of additional subluxation episode.  Follow-up with [place] in 2 weeks for reevaluation.  We will consider starting physical therapy at that time.

## 2018-01-24 NOTE — Progress Notes (Signed)
PCP and consultation requested by: Copland, Gay Filler, MD  Subjective:   HPI: Patient is a 27 y.o. male here for left shoulder injury.  Patient reports on March 21 he returned to his residence with his dogs and found his roommate unresponsive. In a rush to take care of him he tried to get the dogs inside and 1 of them accidentally pulled his left arm sharply in a flexion abduction motion. He states it felt like left shoulder popped out and then back in causing a "twinging pain" deep in his shoulder. He has been using a sling though coming out of this once or twice a day to do some motion exercises and also taking Tylenol as needed. His pain is a 2 out of 10 level and deep anterior shoulder. He is not picked up meloxicam yet. He is right-handed. He states this current injury feels like one that may be he had remotely cannot recall an obvious remote injury just at this current injury felt like it it happened before. No skin changes or numbness.   Past Medical History:  Diagnosis Date  . Anxiety   . Depression     Current Outpatient Medications on File Prior to Visit  Medication Sig Dispense Refill  . albuterol (PROVENTIL HFA;VENTOLIN HFA) 108 (90 Base) MCG/ACT inhaler Inhale 2 puffs into the lungs every 6 (six) hours as needed for wheezing or shortness of breath. 1 Inhaler 0  . buPROPion (WELLBUTRIN XL) 150 MG 24 hr tablet Take 2 tablets by mouth daily.  4  . meloxicam (MOBIC) 7.5 MG tablet Take 1 tablet (7.5 mg total) by mouth daily. 30 tablet 0   No current facility-administered medications on file prior to visit.     History reviewed. No pertinent surgical history.  No Known Allergies  Social History   Socioeconomic History  . Marital status: Single    Spouse name: Not on file  . Number of children: Not on file  . Years of education: Not on file  . Highest education level: Not on file  Occupational History  . Not on file  Social Needs  . Financial resource strain: Not  on file  . Food insecurity:    Worry: Not on file    Inability: Not on file  . Transportation needs:    Medical: Not on file    Non-medical: Not on file  Tobacco Use  . Smoking status: Current Every Day Smoker  . Smokeless tobacco: Current User  Substance and Sexual Activity  . Alcohol use: No    Comment: Stopped drinking a year and a half ago  . Drug use: No  . Sexual activity: Yes    Birth control/protection: Condom  Lifestyle  . Physical activity:    Days per week: Not on file    Minutes per session: Not on file  . Stress: Not on file  Relationships  . Social connections:    Talks on phone: Not on file    Gets together: Not on file    Attends religious service: Not on file    Active member of club or organization: Not on file    Attends meetings of clubs or organizations: Not on file    Relationship status: Not on file  . Intimate partner violence:    Fear of current or ex partner: Not on file    Emotionally abused: Not on file    Physically abused: Not on file    Forced sexual activity: Not on file  Other  Topics Concern  . Not on file  Social History Narrative  . Not on file    History reviewed. No pertinent family history.  BP 133/85   Pulse 74   Ht 5\' 10"  (1.778 m)   Wt 151 lb (68.5 kg)   BMI 21.67 kg/m   Review of Systems: See HPI above.     Objective:  Physical Exam:  Gen: NAD, comfortable in exam room  Right shoulder: No gross deformity, swelling, bruising. Mild tenderness palpation anterior shoulder.  No AC joint or other tenderness. Full internal/external rotation with only 90 degrees of flexion and abduction limited by pain. Mild pain with Hawkins and Neer's. Negative Yergason's. Strength 5 out of 5 with empty can resisted internal and external rotation with mild pain all motions. Pain with apprehension. Negative sulcus. Mildly positive O'Brien's. Neurovascularly intact distally.  Left shoulder: Gross deformity, swelling, bruising. No  tenderness palpation. Full range of motion with 5 out of 5 strength empty can, internal and external rotation. Neurovascular intact distally.   Assessment & Plan:  1.  Left shoulder injury: Independently reviewed radiographs and no evidence of fracture, Hill-Sachs lesion, current dislocation.  Patient's history and exam are consistent with shoulder subluxation.  Advised that he wear the sling for 1 more week he will come out of this twice a day to do shoulder and elbow motion exercises which were reviewed.  Icing, Tylenol.  Encouraged to pick up the meloxicam and start taking this daily with food.  Recommended against lifting more than 5 pounds with this arm or lifting over the shoulder.  Also advised against abduction and external rotation as this motion will put him at greatest risk of additional subluxation episode.  Follow-up with [place] in 2 weeks for reevaluation.  We will consider starting physical therapy at that time.

## 2018-02-07 ENCOUNTER — Ambulatory Visit: Payer: BLUE CROSS/BLUE SHIELD | Admitting: Family Medicine

## 2018-11-02 DIAGNOSIS — I729 Aneurysm of unspecified site: Secondary | ICD-10-CM

## 2018-11-02 HISTORY — DX: Aneurysm of unspecified site: I72.9

## 2019-04-13 ENCOUNTER — Telehealth: Payer: Self-pay | Admitting: *Deleted

## 2019-04-13 ENCOUNTER — Encounter: Payer: Self-pay | Admitting: Family Medicine

## 2019-04-13 ENCOUNTER — Ambulatory Visit: Payer: Self-pay | Admitting: *Deleted

## 2019-04-13 ENCOUNTER — Telehealth: Payer: Self-pay | Admitting: General Practice

## 2019-04-13 ENCOUNTER — Ambulatory Visit (INDEPENDENT_AMBULATORY_CARE_PROVIDER_SITE_OTHER): Payer: BC Managed Care – PPO | Admitting: Family Medicine

## 2019-04-13 ENCOUNTER — Other Ambulatory Visit: Payer: Self-pay

## 2019-04-13 DIAGNOSIS — R110 Nausea: Secondary | ICD-10-CM | POA: Diagnosis not present

## 2019-04-13 DIAGNOSIS — K529 Noninfective gastroenteritis and colitis, unspecified: Secondary | ICD-10-CM

## 2019-04-13 DIAGNOSIS — Z20822 Contact with and (suspected) exposure to covid-19: Secondary | ICD-10-CM

## 2019-04-13 MED ORDER — ONDANSETRON 8 MG PO TBDP: 8.0000 mg | ORAL_TABLET | Freq: Three times a day (TID) | ORAL | 0 refills | Status: DC | PRN

## 2019-04-13 NOTE — Telephone Encounter (Signed)
Pt has been scheduled Covid-19 testing.   Scheduled apt with pt directly.   Pt was referred by: Dr. Roma Schanz

## 2019-04-13 NOTE — Telephone Encounter (Signed)
Patient states his symptoms started 3 days ago- he has started with vomiting. Patient states the diarrhea started the next day. Patient has been having loose 3 days now. Body ache and fatigue have been a problem- he has not had chills. Patient has had headache at times.   Call to office for appointment- patient may need COVID testing due to symptoms.  Reason for Disposition . [1] MODERATE weakness AND [2] from poor fluid intake AND [3] no improvement after 2 hours of rest and fluids  Answer Assessment - Initial Assessment Questions 1. DIARRHEA SEVERITY: "How bad is the diarrhea?" "How many extra stools have you had in the past 24 hours than normal?"    - NO DIARRHEA (SCALE 0)   - MILD (SCALE 1-3): Few loose or mushy BMs; increase of 1-3 stools over normal daily number of stools; mild increase in ostomy output.   -  MODERATE (SCALE 4-7): Increase of 4-6 stools daily over normal; moderate increase in ostomy output. * SEVERE (SCALE 8-10; OR 'WORST POSSIBLE'): Increase of 7 or more stools daily over normal; moderate increase in ostomy output; incontinence.     Mild- one really bad BM/day 2. ONSET: "When did the diarrhea begin?"      3 days ago 3. BM CONSISTENCY: "How loose or watery is the diarrhea?"      Loose- watery the first day 4. VOMITING: "Are you also vomiting?" If so, ask: "How many times in the past 24 hours?"      Vomiting first and second day- he has nausea after eating- 1 time yesterday at work 5. ABDOMINAL PAIN: "Are you having any abdominal pain?" If yes: "What does it feel like?" (e.g., crampy, dull, intermittent, constant)      No abdominal pain 6. ABDOMINAL PAIN SEVERITY: If present, ask: "How bad is the pain?"  (e.g., Scale 1-10; mild, moderate, or severe)   - MILD (1-3): doesn't interfere with normal activities, abdomen soft and not tender to touch    - MODERATE (4-7): interferes with normal activities or awakens from sleep, tender to touch    - SEVERE (8-10): excruciating  pain, doubled over, unable to do any normal activities       n/a 7. ORAL INTAKE: If vomiting, "Have you been able to drink liquids?" "How much fluids have you had in the past 24 hours?"     Patient has been able to intake liquids- fair amount 8. HYDRATION: "Any signs of dehydration?" (e.g., dry mouth [not just dry lips], too weak to stand, dizziness, new weight loss) "When did you last urinate?"     Weakness, voiding normally 9. EXPOSURE: "Have you traveled to a foreign country recently?" "Have you been exposed to anyone with diarrhea?" "Could you have eaten any food that was spoiled?"     No to all 10. ANTIBIOTIC USE: "Are you taking antibiotics now or have you taken antibiotics in the past 2 months?"       no 11. OTHER SYMPTOMS: "Do you have any other symptoms?" (e.g., fever, blood in stool)       no 12. PREGNANCY: "Is there any chance you are pregnant?" "When was your last menstrual period?"       n/a  Answer Assessment - Initial Assessment Questions 1. DESCRIPTION: "Describe how you are feeling."     Feels like does not want to get up off the couch- weak in general 2. SEVERITY: "How bad is it?"  "Can you stand and walk?"   - MILD -  Feels weak or tired, but does not interfere with work, school or normal activities   - [place] to stand and walk; weakness interferes with work, school, or normal activities   - SEVERE - Unable to stand or walk     Mild/moderate 3. ONSET:  "When did the weakness begin?"     First day 4. CAUSE: "What do you think is causing the weakness?"     Virus- nausea and diarrhea 5. MEDICINES: "Have you recently started a new medicine or had a change in the amount of a medicine?"     No changes 6. OTHER SYMPTOMS: "Do you have any other symptoms?" (e.g., chest pain, fever, cough, SOB, vomiting, diarrhea, bleeding, other areas of pain)     Vomiting, diarrhea, back pain 7. PREGNANCY: "Is there any chance you are pregnant?" "When was your last menstrual  period?"     n/a  Protocols used: WEAKNESS (GENERALIZED) AND FATIGUE-A-AH, DIARRHEA-A-AH

## 2019-04-13 NOTE — Assessment & Plan Note (Signed)
Will test for covid zofran for nausea Brat diet  Call or rto prn

## 2019-04-13 NOTE — Progress Notes (Signed)
Virtual Visit via Video Note  I connected with Stephen Castro on 04/13/19 at  2:15 PM EDT by a video enabled telemedicine application and verified that I am speaking with the correct person using two identifiers.  Location: Patient: home  Provider: home    I discussed the limitations of evaluation and management by telemedicine and the availability of in person appointments. The patient expressed understanding and agreed to proceed.  History of Present Illness: Pt has had NVD x few days , chills and bodyaches  NVD has slowed down but he still gets nauseous   Observations/Objective: No vitals obtained Pt is in NAD   Assessment and Plan: 1. Nausea   - ondansetron (ZOFRAN ODT) 8 MG disintegrating tablet; Take 1 tablet (8 mg total) by mouth every 8 (eight) hours as needed for nausea or vomiting.  Dispense: 20 tablet; Refill: 0  2. Gastroenteritis Brat diet  Test for covid zofran for nausea rto prn    Follow Up Instructions:    I discussed the assessment and treatment plan with the patient. The patient was provided an opportunity to ask questions and all were answered. The patient agreed with the plan and demonstrated an understanding of the instructions.   The patient was advised to call back or seek an in-person evaluation if the symptoms worsen or if the condition fails to improve as anticipated.  I provided 15 minutes of non-face-to-face time during this encounter.   Ann Held, DO

## 2019-04-13 NOTE — Addendum Note (Signed)
Addended by: Denman George on: 04/13/2019 03:35 PM   Modules accepted: Orders

## 2019-04-13 NOTE — Telephone Encounter (Signed)
Dr. Carollee Herter would like covid testing for this patient.  He has diarrhea and nausea.

## 2019-04-13 NOTE — Telephone Encounter (Signed)
Virtual visit scheduled.  

## 2019-04-14 ENCOUNTER — Other Ambulatory Visit: Payer: BC Managed Care – PPO

## 2019-04-14 DIAGNOSIS — Z20822 Contact with and (suspected) exposure to covid-19: Secondary | ICD-10-CM

## 2019-04-14 DIAGNOSIS — R6889 Other general symptoms and signs: Secondary | ICD-10-CM | POA: Diagnosis not present

## 2019-04-16 LAB — NOVEL CORONAVIRUS, NAA: SARS-CoV-2, NAA: NOT DETECTED

## 2019-04-23 ENCOUNTER — Emergency Department (HOSPITAL_COMMUNITY)
Admission: EM | Admit: 2019-04-23 | Discharge: 2019-04-23 | Disposition: A | Discharge status: Home / Self Care | Payer: BC Managed Care – PPO | Attending: Emergency Medicine | Admitting: Emergency Medicine

## 2019-04-23 ENCOUNTER — Encounter (HOSPITAL_COMMUNITY): Payer: Self-pay | Admitting: Emergency Medicine

## 2019-04-23 ENCOUNTER — Other Ambulatory Visit: Payer: Self-pay

## 2019-04-23 ENCOUNTER — Emergency Department (HOSPITAL_COMMUNITY): Payer: BC Managed Care – PPO

## 2019-04-23 DIAGNOSIS — J019 Acute sinusitis, unspecified: Secondary | ICD-10-CM | POA: Diagnosis not present

## 2019-04-23 DIAGNOSIS — J0110 Acute frontal sinusitis, unspecified: Secondary | ICD-10-CM | POA: Diagnosis not present

## 2019-04-23 DIAGNOSIS — F17200 Nicotine dependence, unspecified, uncomplicated: Secondary | ICD-10-CM | POA: Diagnosis not present

## 2019-04-23 DIAGNOSIS — I10 Essential (primary) hypertension: Secondary | ICD-10-CM | POA: Diagnosis not present

## 2019-04-23 DIAGNOSIS — R51 Headache: Secondary | ICD-10-CM | POA: Diagnosis not present

## 2019-04-23 DIAGNOSIS — F17220 Nicotine dependence, chewing tobacco, uncomplicated: Secondary | ICD-10-CM | POA: Insufficient documentation

## 2019-04-23 LAB — CBC
HCT: 46.3 % (ref 39.0–52.0)
Hemoglobin: 15.2 g/dL (ref 13.0–17.0)
MCH: 32.5 pg (ref 26.0–34.0)
MCHC: 32.8 g/dL (ref 30.0–36.0)
MCV: 98.9 fL (ref 80.0–100.0)
Platelets: 245 10*3/uL (ref 150–400)
RBC: 4.68 MIL/uL (ref 4.22–5.81)
RDW: 11.7 % (ref 11.5–15.5)
WBC: 8.9 10*3/uL (ref 4.0–10.5)
nRBC: 0 % (ref 0.0–0.2)

## 2019-04-23 LAB — BASIC METABOLIC PANEL
Anion gap: 7 (ref 5–15)
BUN: 14 mg/dL (ref 6–20)
CO2: 29 mmol/L (ref 22–32)
Calcium: 9.4 mg/dL (ref 8.9–10.3)
Chloride: 103 mmol/L (ref 98–111)
Creatinine, Ser: 1.04 mg/dL (ref 0.61–1.24)
GFR calc Af Amer: >60 mL/min (ref >60)
GFR calc non Af Amer: >60 mL/min (ref >60)
Glucose, Bld: 97 mg/dL (ref 70–99)
Potassium: 3.7 mmol/L (ref 3.5–5.1)
Sodium: 139 mmol/L (ref 135–145)

## 2019-04-23 MED ORDER — AMOXICILLIN 500 MG PO CAPS: 500.0000 mg | ORAL_CAPSULE | Freq: Three times a day (TID) | ORAL | 0 refills | Status: DC

## 2019-04-23 MED ORDER — KETOROLAC TROMETHAMINE 30 MG/ML IJ SOLN
30.0000 mg | Freq: Once | INTRAMUSCULAR | Status: AC
  Administered 2019-04-23: 30 mg via INTRAVENOUS
  Filled 2019-04-23: qty 1

## 2019-04-23 MED ORDER — DIPHENHYDRAMINE HCL 50 MG/ML IJ SOLN
12.5000 mg | Freq: Once | INTRAMUSCULAR | Status: AC
  Administered 2019-04-23: 12.5 mg via INTRAVENOUS
  Filled 2019-04-23: qty 1

## 2019-04-23 MED ORDER — PROCHLORPERAZINE EDISYLATE 10 MG/2ML IJ SOLN
10.0000 mg | Freq: Once | INTRAMUSCULAR | Status: AC
  Administered 2019-04-23: 10 mg via INTRAVENOUS
  Filled 2019-04-23: qty 2

## 2019-04-23 MED ORDER — DIPHENHYDRAMINE HCL 50 MG/ML IJ SOLN
25.0000 mg | Freq: Once | INTRAMUSCULAR | Status: AC
  Administered 2019-04-23: 25 mg via INTRAVENOUS
  Filled 2019-04-23: qty 1

## 2019-04-23 NOTE — ED Triage Notes (Signed)
Pt c/o headache x 4-5 days. Having bruising spots on right flank that came up yesterday. Unsure where bruising is from denies hurting.

## 2019-04-23 NOTE — ED Provider Notes (Signed)
[facility] DEPT Provider Note   CSN: [id number] Arrival date & time: 04/23/19  1338    History   Chief Complaint Chief Complaint  Patient presents with  . Headache    HPI Stephen Castro is a 28 y.o. male.     HPI Patient states he started having a headache about 5 days ago.  It was gradual in onset in the back of his head.  It is moved to the front of his head and is now bilateral nature.  Patient has tried several over-the-counter medications without relief.  Patient started become concerned when the headache would not go away because he states there is a family history of aneurysm.  He denies any nausea vomiting.  No fevers or chills.  No numbness or weakness.  Patient also has few areas of bruising on his torso.  He is not sure what specifically caused that.  He is not having trouble with chest pain or shortness of breath.  No abdominal pain. Past Medical History:  Diagnosis Date  . Anxiety   . Depression     Patient Active Problem List   Diagnosis Date Noted  . Gastroenteritis 04/13/2019  . Shoulder injury, left, initial encounter 01/24/2018  . ADHD (attention deficit hyperactivity disorder) 04/04/2013  . Anxiety state, unspecified 04/04/2013    History reviewed. No pertinent surgical history.      Home Medications    Prior to Admission medications   Medication Sig Start Date End Date Taking? Authorizing Provider  ibuprofen (ADVIL) 200 MG tablet Take 400 mg by mouth every 6 (six) hours as needed for moderate pain.   Yes [provider]  albuterol (PROVENTIL HFA;VENTOLIN HFA) 108 (90 Base) MCG/ACT inhaler Inhale 2 puffs into the lungs every 6 (six) hours as needed for wheezing or shortness of breath. 02/13/16   Copland, Gay Filler, MD  amoxicillin (AMOXIL) 500 MG capsule Take 1 capsule (500 mg total) by mouth 3 (three) times daily. 04/23/19   Dorie Rank, MD  ondansetron (ZOFRAN ODT) 8 MG disintegrating tablet Take 1 tablet (8  mg total) by mouth every 8 (eight) hours as needed for nausea or vomiting. 04/13/19   Ann Held, DO    Family History No family history on file.  Social History Social History   Tobacco Use  . Smoking status: Current Every Day Smoker  . Smokeless tobacco: Current User  Substance Use Topics  . Alcohol use: No    Comment: Stopped drinking a year and a half ago  . Drug use: No     Allergies   Patient has no known allergies.   Review of Systems Review of Systems  All other systems reviewed and are negative.    Physical Exam Updated Vital Signs BP (!) 151/106 (BP Location: Left Arm)   Pulse 79   Temp 98.8 F (37.1 C) (Oral)   Resp 20   SpO2 100%   Physical Exam Vitals signs and nursing note reviewed.  Constitutional:      General: He is not in acute distress.    Appearance: He is well-developed.  HENT:     Head: Normocephalic and atraumatic.     Right Ear: External ear normal.     Left Ear: External ear normal.  Eyes:     General: No scleral icterus.       Right eye: No discharge.        Left eye: No discharge.     Conjunctiva/sclera: Conjunctivae normal.  Neck:     Musculoskeletal: Neck supple.     Trachea: No tracheal deviation.  Cardiovascular:     Rate and Rhythm: Normal rate and regular rhythm.  Pulmonary:     Effort: Pulmonary effort is normal. No respiratory distress.     Breath sounds: Normal breath sounds. No stridor. No wheezing or rales.  Abdominal:     General: Bowel sounds are normal. There is no distension.     Palpations: Abdomen is soft.     Tenderness: There is no abdominal tenderness. There is no guarding or rebound.  Musculoskeletal:        General: No tenderness.  Skin:    General: Skin is warm and dry.     Findings: No petechiae or rash. Rash is not purpuric.     Comments: Very feint bruising noted right torso, no ttp  Neurological:     Mental Status: He is alert.     Cranial Nerves: No cranial nerve deficit (no facial  droop, extraocular movements intact, no slurred speech).     Sensory: No sensory deficit.     Motor: No abnormal muscle tone or seizure activity.     Coordination: Coordination normal.      ED Treatments / Results  Labs (all labs ordered are listed, but only abnormal results are displayed) Labs Reviewed  CBC  BASIC METABOLIC PANEL     Radiology Ct Head Wo Contrast  Result Date: 04/23/2019 CLINICAL DATA:  28 year old male with severe headache for 5 days. EXAM: CT HEAD WITHOUT CONTRAST TECHNIQUE: Contiguous axial images were obtained from the base of the skull through the vertex without intravenous contrast. COMPARISON:  12/07/2013 head CT FINDINGS: Brain: No evidence of acute infarction, hemorrhage, hydrocephalus, extra-axial collection or mass lesion/mass effect. Vascular: No hyperdense vessel or unexpected calcification. Skull: Normal. Negative for fracture or focal lesion. Sinuses/Orbits: Fluid in the LEFT frontal sinus noted. Other: None. IMPRESSION: 1. No intracranial abnormality 2. LEFT frontal sinus fluid which may indicate acute sinusitis. Electronically Signed   By: Margarette Canada M.D.   On: 04/23/2019 18:33    Procedures Procedures (including critical care time)  Medications Ordered in ED Medications  prochlorperazine (COMPAZINE) injection 10 mg (10 mg Intravenous Given 04/23/19 1757)  diphenhydrAMINE (BENADRYL) injection 12.5 mg (12.5 mg Intravenous Given 04/23/19 1757)  ketorolac (TORADOL) 30 MG/ML injection 30 mg (30 mg Intravenous Given 04/23/19 1757)  diphenhydrAMINE (BENADRYL) injection 25 mg (25 mg Intravenous Given 04/23/19 1838)     Initial Impression / Assessment and Plan / ED Course  I have reviewed the triage vital signs and the nursing notes.  Pertinent labs & imaging results that were available during my care of the patient were reviewed by me and considered in my medical decision making (see chart for details).  Clinical Course as of Apr 22 1858  Sun Apr 23, 2019  1834 Notifed pt is having some agitation.  C/w akesthesia.  Will give another dose of benadryl   [JK]  1856 he is feeling better.  Results discussed with patient.  He is ready for discharge   [JK]    Clinical Course User Index [JK] Dorie Rank, MD     Patient presented to emergency room for persistent headache.  Laboratory tests are unremarkable.  CT scan of the head does not show any evidence of hemorrhage but there is evidence of frontal sinusitis.  I suspect this may be causing the patient's persistent symptoms.  He did not have a thunderclap onset headache.  I have a low suspicion for subarachnoid hemorrhage.  I did discuss with the patient that the CT scan cannot completely exclude that possibility but I do think we have another etiology for his headache with the sinus inflammation noted on CT.  Plan on discharge home with prescription for amoxicillin.  Discussed follow-up with a primary care doctor regarding his blood pressure.  Final Clinical Impressions(s) / ED Diagnoses   Final diagnoses:  Acute non-recurrent frontal sinusitis  Hypertension, unspecified type    ED Discharge Orders         Ordered    amoxicillin (AMOXIL) 500 MG capsule  3 times daily     04/23/19 1853           Dorie Rank, MD 04/23/19 1859

## 2019-04-23 NOTE — Discharge Instructions (Signed)
Take the antibiotics as prescribed, continue over-the-counter medications as needed, you can also try taking decongestant medication.  Follow up with a  primary care doctor to recheck your blood pressure as it was noted to be elevated this evening

## 2019-04-24 ENCOUNTER — Ambulatory Visit (INDEPENDENT_AMBULATORY_CARE_PROVIDER_SITE_OTHER): Payer: BC Managed Care – PPO | Admitting: Family Medicine

## 2019-04-24 ENCOUNTER — Encounter: Payer: Self-pay | Admitting: Family Medicine

## 2019-04-24 DIAGNOSIS — G4452 New daily persistent headache (NDPH): Secondary | ICD-10-CM | POA: Diagnosis not present

## 2019-04-24 MED ORDER — BUTALBITAL-APAP-CAFFEINE 50-325-40 MG PO TABS: 1.0000 | ORAL_TABLET | Freq: Four times a day (QID) | ORAL | 0 refills | Status: DC | PRN

## 2019-04-24 NOTE — Progress Notes (Signed)
[facility] [address] [place], [place] [address] [phone]  Date:  04/24/2019   Name:  Stephen Castro   DOB:  [birth year]   MRN:  [id number]  PCP:  Darreld Mclean, MD    Chief Complaint: No chief complaint on file.   History of Present Illness:  Stephen Castro is a 28 y.o. very pleasant male patient who presents with the following:  Pt who I last saw in 12/2017, here today for a virtual visit with concern of headaches Pt location is home, provider is at office Pt ID confirmed with 2 identifiers, he gives consent for virtual visit today He notes that 2.5 weeks ago he was sick with nausea and vomiting. He was tested for COVID and was negative- GI sx resolved 5 days ago he awoke with neck pain and a headache He thought it might be pinched nerve but as the neck got better his head continued to hurt.  The pain then seemed to move into his frontal head.  He also noted some bruising on his trunk so he called in- he was told to go to the ER  He was seen in the ER on 04/23/2019- had a CT which was negative except for sinusitis, thought to be cause of his HA.  He was treated with abx He has noted that he feels really tired and might sleep for up to 12 hours for the last 5 days No fever noted  No cough GI sx are resolved   He has noted some small bruises on his trunk- at first was not sure where these came from but then remembered that he did have his 28th b-day recently and there was some roughhousing  He is eating pretty well normally Patient Active Problem List   Diagnosis Date Noted  . Gastroenteritis 04/13/2019  . Shoulder injury, left, initial encounter 01/24/2018  . ADHD (attention deficit hyperactivity disorder) 04/04/2013  . Anxiety state, unspecified 04/04/2013    Past Medical History:  Diagnosis Date  . Anxiety   . Depression     No past surgical history on file.  Social History   Tobacco Use  .  Smoking status: Current Every Day Smoker  . Smokeless tobacco: Current User  Substance Use Topics  . Alcohol use: No    Comment: Stopped drinking a year and a half ago  . Drug use: No    No family history on file.  No Known Allergies  Medication list has been reviewed and updated.  Current Outpatient Medications on File Prior to Visit  Medication Sig Dispense Refill  . albuterol (PROVENTIL HFA;VENTOLIN HFA) 108 (90 Base) MCG/ACT inhaler Inhale 2 puffs into the lungs every 6 (six) hours as needed for wheezing or shortness of breath. 1 Inhaler 0  . amoxicillin (AMOXIL) 500 MG capsule Take 1 capsule (500 mg total) by mouth 3 (three) times daily. 21 capsule 0  . ibuprofen (ADVIL) 200 MG tablet Take 400 mg by mouth every 6 (six) hours as needed for moderate pain.    Marland Kitchen ondansetron (ZOFRAN ODT) 8 MG disintegrating tablet Take 1 tablet (8 mg total) by mouth every 8 (eight) hours as needed for nausea or vomiting. 20 tablet 0   No current facility-administered medications on file prior to visit.     Review of Systems:  As per HPI- otherwise negative.   Physical Examination: There were no vitals filed for this visit. There were  no vitals filed for this visit. There is no height or weight on file to calculate BMI. Ideal Body Weight:    Pt observed over video  He looks well- no cough or distress.no wheezing He has some faint bruises over the right ribs- on reflection he thinks this might be due to "roughhousing" during his recent party.  These may certainly represent fingertip bruises  He is not checking his BP at home Able to flex his chin to his chest   BP Readings from Last 3 Encounters:  04/23/19 (!) 150/103  01/24/18 133/85  01/20/18 (!) 145/88     Assessment and Plan:   ICD-10-CM   1. New daily persistent headache  G44.52 butalbital-acetaminophen-caffeine (FIORICET) 50-325-40 MG tablet   Following up from recent ER visit for headache.  He was dx with sinusitis -just  started abx yesterday He will let me know how his sx do over the next few days If not feeling much better by later on this week we will bring him in for a visit I did give him some fioricet as he requests something to take for his headache besides OTC Cautioned that this can cause sedation      Follow-up: No follow-ups on file.  Meds ordered this encounter  Medications  . butalbital-acetaminophen-caffeine (FIORICET) 50-325-40 MG tablet    Sig: Take 1-2 tablets by mouth every 6 (six) hours as needed for headache. Max 6 per 24 hours    Dispense:  20 tablet    Refill:  0   No orders of the defined types were placed in this encounter.       Signed Lamar Blinks, MD

## 2019-05-01 ENCOUNTER — Ambulatory Visit: Payer: Self-pay

## 2019-05-01 NOTE — Telephone Encounter (Signed)
Returned call to patient who states he has had a sinus infection diagnosed and antibiotic amoxicillin prescribed and completed today. He states that he still continues with off and on headaches. He is working but states that he will go lye down for 5 minutes and it will turn into 5 hours. He has been tested for COVID-19. He was negative.He is requesting a follow-up appointment with Dr Lorelei Pont. Care advice reviewed with patient. Pt states he is taking Motrin a lot for headache He was cautioned to use caution and not upset his stomach. Pt verbalized understanding.    Reason for Disposition . [1] Taking antibiotic > 72 hours (3 days) AND [2] sinus pain not improved  Answer Assessment - Initial Assessment Questions 1. ANTIBIOTIC: "What antibiotic are you receiving?" "How many times per day?"     amoxicillin 2. ONSET: "When was the antibiotic started?"     Finished today 3. PAIN: "How bad is the sinus pain?"   (Scale 1-10; mild, moderate or severe)   - MILD (1-3): doesn't interfere with normal activities    - MODERATE (4-7): interferes with normal activities (e.g., work or school) or awakens from sleep   - SEVERE (8-10): excruciating pain and patient unable to do any normal activities        moderate 4. FEVER: "Do you have a fever?" If so, ask: "What is it, how was it measured, and when did it start?"     unsure 5. SYMPTOMS: "Are there any other symptoms you're concerned about?" If so, ask: "When did it start?"    Headache 6. PREGNANCY: "Is there any chance you are pregnant?" "When was your last menstrual period?"     N/A  Protocols used: SINUS INFECTION ON ANTIBIOTIC FOLLOW-UP CALL-A-AH

## 2019-05-02 ENCOUNTER — Encounter: Payer: Self-pay | Admitting: Family Medicine

## 2019-05-02 ENCOUNTER — Ambulatory Visit: Payer: BC Managed Care – PPO | Admitting: Family Medicine

## 2019-05-02 ENCOUNTER — Other Ambulatory Visit: Payer: Self-pay

## 2019-05-02 VITALS — BP 142/100 | HR 74 | Temp 98.1°F | Resp 16 | Ht 70.0 in | Wt 173.0 lb

## 2019-05-02 DIAGNOSIS — R030 Elevated blood-pressure reading, without diagnosis of hypertension: Secondary | ICD-10-CM | POA: Diagnosis not present

## 2019-05-02 DIAGNOSIS — R51 Headache: Secondary | ICD-10-CM | POA: Diagnosis not present

## 2019-05-02 DIAGNOSIS — R519 Headache, unspecified: Secondary | ICD-10-CM

## 2019-05-02 DIAGNOSIS — G44209 Tension-type headache, unspecified, not intractable: Secondary | ICD-10-CM | POA: Diagnosis not present

## 2019-05-02 MED ORDER — CYCLOBENZAPRINE HCL 10 MG PO TABS: 5.0000 mg | ORAL_TABLET | Freq: Three times a day (TID) | ORAL | 0 refills | Status: DC | PRN

## 2019-05-02 MED ORDER — BUTALBITAL-APAP-CAFFEINE 50-325-40 MG PO TABS: 1.0000 | ORAL_TABLET | Freq: Four times a day (QID) | ORAL | 0 refills | Status: DC | PRN

## 2019-05-02 NOTE — Patient Instructions (Signed)
Heat (pad or rice pillow in microwave) over affected area, 10-15 minutes twice daily.   Take Flexeril (cyclobenzaprine) 1-2 hours before planned bedtime. If it makes you drowsy, do not take during the day. You can try half a tab the following night.  EXERCISES RANGE OF MOTION (ROM) AND STRETCHING EXERCISES  These exercises may help you when beginning to rehabilitate your issue. In order to successfully resolve your symptoms, you must improve your posture. These exercises are designed to help reduce the forward-head and rounded-shoulder posture which contributes to this condition. Your symptoms may resolve with or without further involvement from your physician, physical therapist or athletic trainer. While completing these exercises, remember:   Restoring tissue flexibility helps normal motion to return to the joints. This allows healthier, less painful movement and activity.  An effective stretch should be held for at least 20 seconds, although you may need to begin with shorter hold times for comfort.  A stretch should never be painful. You should only feel a gentle lengthening or release in the stretched tissue.  Do not do any stretch or exercise that you cannot tolerate.  STRETCH- Axial Extensors  Lie on your back on the floor. You may bend your knees for comfort. Place a rolled-up hand towel or dish towel, about 2 inches in diameter, under the part of your head that makes contact with the floor.  Gently tuck your chin, as if trying to make a "double chin," until you feel a gentle stretch at the base of your head.  Hold 15-20 seconds. Repeat 2-3 times. Complete this exercise 1 time per day.   STRETCH - Axial Extension   Stand or sit on a firm surface. Assume a good posture: chest up, shoulders drawn back, abdominal muscles slightly tense, knees unlocked (if standing) and feet hip width apart.  Slowly retract your chin so your head slides back and your chin slightly lowers. Continue  to look straight ahead.  You should feel a gentle stretch in the back of your head. Be certain not to feel an aggressive stretch since this can cause headaches later.  Hold for 15-20 seconds. Repeat 2-3 times. Complete this exercise 1 time per day.  STRETCH - Cervical Side Bend   Stand or sit on a firm surface. Assume a good posture: chest up, shoulders drawn back, abdominal muscles slightly tense, knees unlocked (if standing) and feet hip width apart.  Without letting your nose or shoulders move, slowly tip your right / left ear to your shoulder until your feel a gentle stretch in the muscles on the opposite side of your neck.  Hold 15-20 seconds. Repeat 2-3 times. Complete this exercise 1-2 times per day.  STRETCH - Cervical Rotators   Stand or sit on a firm surface. Assume a good posture: chest up, shoulders drawn back, abdominal muscles slightly tense, knees unlocked (if standing) and feet hip width apart.  Keeping your eyes level with the ground, slowly turn your head until you feel a gentle stretch along the back and opposite side of your neck.  Hold 15-20 seconds. Repeat 2-3 times. Complete this exercise 1-2 times per day.  RANGE OF MOTION - Neck Circles   Stand or sit on a firm surface. Assume a good posture: chest up, shoulders drawn back, abdominal muscles slightly tense, knees unlocked (if standing) and feet hip width apart.  Gently roll your head down and around from the back of one shoulder to the back of the other. The motion should never  be forced or painful.  Repeat the motion 10-20 times, or until you feel the neck muscles relax and loosen. Repeat 2-3 times. Complete the exercise 1-2 times per day. STRENGTHENING EXERCISES - Cervical Strain and Sprain These exercises may help you when beginning to rehabilitate your injury. They may resolve your symptoms with or without further involvement from your physician, physical therapist, or athletic trainer. While completing  these exercises, remember:   Muscles can gain both the endurance and the strength needed for everyday activities through controlled exercises.  Complete these exercises as instructed by your physician, physical therapist, or athletic trainer. Progress the resistance and repetitions only as guided.  You may experience muscle soreness or fatigue, but the pain or discomfort you are trying to eliminate should never worsen during these exercises. If this pain does worsen, stop and make certain you are following the directions exactly. If the pain is still present after adjustments, discontinue the exercise until you can discuss the trouble with your clinician.  STRENGTH - Cervical Flexors, Isometric  Face a wall, standing about 6 inches away. Place a small pillow, a ball about 6-8 inches in diameter, or a folded towel between your forehead and the wall.  Slightly tuck your chin and gently push your forehead into the soft object. Push only with mild to moderate intensity, building up tension gradually. Keep your jaw and forehead relaxed.  Hold 10 to 20 seconds. Keep your breathing relaxed.  Release the tension slowly. Relax your neck muscles completely before you start the next repetition. Repeat 2-3 times. Complete this exercise 1 time per day.  STRENGTH- Cervical Lateral Flexors, Isometric   Stand about 6 inches away from a wall. Place a small pillow, a ball about 6-8 inches in diameter, or a folded towel between the side of your head and the wall.  Slightly tuck your chin and gently tilt your head into the soft object. Push only with mild to moderate intensity, building up tension gradually. Keep your jaw and forehead relaxed.  Hold 10 to 20 seconds. Keep your breathing relaxed.  Release the tension slowly. Relax your neck muscles completely before you start the next repetition. Repeat 2-3 times. Complete this exercise 1 time per day.  STRENGTH - Cervical Extensors, Isometric   Stand  about 6 inches away from a wall. Place a small pillow, a ball about 6-8 inches in diameter, or a folded towel between the back of your head and the wall.  Slightly tuck your chin and gently tilt your head back into the soft object. Push only with mild to moderate intensity, building up tension gradually. Keep your jaw and forehead relaxed.  Hold 10 to 20 seconds. Keep your breathing relaxed.  Release the tension slowly. Relax your neck muscles completely before you start the next repetition. Repeat 2-3 times. Complete this exercise 1 time per day.  POSTURE AND BODY MECHANICS CONSIDERATIONS Keeping correct posture when sitting, standing or completing your activities will reduce the stress put on different body tissues, allowing injured tissues a chance to heal and limiting painful experiences. The following are general guidelines for improved posture. Your physician or physical therapist will provide you with any instructions specific to your needs. While reading these guidelines, remember:  The exercises prescribed by your provider will help you have the flexibility and strength to maintain correct postures.  The correct posture provides the optimal environment for your joints to work. All of your joints have less wear and tear when properly supported by a  spine with good posture. This means you will experience a healthier, less painful body.  Correct posture must be practiced with all of your activities, especially prolonged sitting and standing. Correct posture is as important when doing repetitive low-stress activities (typing) as it is when doing a single heavy-load activity (lifting).  PROLONGED STANDING WHILE SLIGHTLY LEANING FORWARD When completing a task that requires you to lean forward while standing in one place for a long time, place either foot up on a stationary 2- to 4-inch high object to help maintain the best posture. When both feet are on the ground, the low back tends to lose  its slight inward curve. If this curve flattens (or becomes too large), then the back and your other joints will experience too much stress, fatigue more quickly, and can cause pain.   RESTING POSITIONS Consider which positions are most painful for you when choosing a resting position. If you have pain with flexion-based activities (sitting, bending, stooping, squatting), choose a position that allows you to rest in a less flexed posture. You would want to avoid curling into a fetal position on your side. If your pain worsens with extension-based activities (prolonged standing, working overhead), avoid resting in an extended position such as sleeping on your stomach. Most people will find more comfort when they rest with their spine in a more neutral position, neither too rounded nor too arched. Lying on a non-sagging bed on your side with a pillow between your knees, or on your back with a pillow under your knees will often provide some relief. Keep in mind, being in any one position for a prolonged period of time, no matter how correct your posture, can still lead to stiffness.  WALKING Walk with an upright posture. Your ears, shoulders, and hips should all line up. OFFICE WORK When working at a desk, create an environment that supports good, upright posture. Without extra support, muscles fatigue and lead to excessive strain on joints and other tissues.  CHAIR:  A chair should be able to slide under your desk when your back makes contact with the back of the chair. This allows you to work closely.  The chair's height should allow your eyes to be level with the upper part of your monitor and your hands to be slightly lower than your elbows.  Body position: ? Your feet should make contact with the floor. If this is not possible, use a foot rest. ? Keep your ears over your shoulders. This will reduce stress on your neck and low back.

## 2019-05-02 NOTE — Progress Notes (Signed)
Chief Complaint  Patient presents with  . Headache    2 weeks, negative covid, starts in neck radiated to back of head  . Unusual bleeding     Stephen Castro is a 28 y.o. male here for evaluation of an acute headache.  Duration: 2 weeks Laterality: L posterior Quality: aching, sometimes stabbing Severity: 2/10 currently Associated symptoms: some light sensitivity Therapies tried: Tylenol, ibuprofen, Fioricet.  Hx of migraines: Yes. ED visit showed neg CT Migraine cocktail helped for a period of time He is having neck pain +Famhx of brain aneurysms in maternal uncle and paternal grandfather; mother had hem stroke at age 75.  +smoker.   ROS:  Neuro: +HA MSK: +neck pain  Past Medical History:  Diagnosis Date  . Anxiety   . Depression     BP (!) 142/100 (BP Location: Left Arm, Patient Position: Sitting, Cuff Size: Large)   Pulse 74   Temp 98.1 F (36.7 C) (Oral)   Resp 16   Ht 5\' 10"  (1.778 m)   Wt 173 lb (78.5 kg)   SpO2 98%   BMI 24.82 kg/m  Gen: awake, alert, appearing stated age Eyes: PERRLA, EOMi, no injection Lungs: no accessory muscle use Abd: BS+, soft, NT, ND Neuro: CN2-12 grossly intact, fluent and goal-oriented speech, DTR's equal and symmetric in UE's and LE's MSK: 5/5 strength throughout, no TTP over cervical paraspinal musculature or occipital triangle region Psych: Age appropriate judgment and insight, normal affect and mood  Acute non intractable tension-type headache - Plan: cyclobenzaprine (FLEXERIL) 10 MG tablet, stretches/exercises given for neck, refill fioricet for aborting headaches, hold off on triptans given famhx.  Acute intractable headache, unspecified headache type - Plan: MR Angiogram Head Wo Contrast, ck angiogram given new type of headache and famhx of aneurysm. Additionally, he is a smoker which increases his risk. If too expensive, will consider referring to Neuro.   Orders as above. BP likely 2/2 pain and stress. Reck with reg  PCP.  F/u prn. The pt voiced understanding and agreement to the plan.  McRae, DO 05/02/19 4:38 PM

## 2019-05-02 NOTE — Telephone Encounter (Signed)
Needs virtual visit please.  

## 2019-05-03 ENCOUNTER — Telehealth: Payer: Self-pay

## 2019-05-03 DIAGNOSIS — G44209 Tension-type headache, unspecified, not intractable: Secondary | ICD-10-CM

## 2019-05-03 DIAGNOSIS — Z8249 Family history of ischemic heart disease and other diseases of the circulatory system: Secondary | ICD-10-CM

## 2019-05-03 DIAGNOSIS — R519 Headache, unspecified: Secondary | ICD-10-CM

## 2019-05-03 NOTE — Telephone Encounter (Signed)
Patient called to let us know he will like to see Dr. Antony Contras MD at [facility]  He also said he checked benefits with his insurance company and he will like to go ahead with imagine studies.

## 2019-05-03 NOTE — Telephone Encounter (Signed)
Referral placed. I am running into resistance from our end of getting it covered.

## 2019-05-03 NOTE — Addendum Note (Signed)
Addended by: Ames Coupe on: 05/03/2019 02:30 PM   Modules accepted: Orders

## 2019-05-04 NOTE — Telephone Encounter (Signed)
Do you have a number to call for peer to peer?

## 2019-05-04 NOTE — Telephone Encounter (Signed)
Pt mother stated that she was going to call Son and tell him that the office would be calling.  Mother is upset that she is not on the [place] form.  She stated that pt is "loopy" because of the meds and my notunderstand what is going on

## 2019-05-04 NOTE — Addendum Note (Signed)
Addended by: Ames Coupe on: 05/04/2019 04:56 PM   Modules accepted: Orders

## 2019-05-04 NOTE — Telephone Encounter (Addendum)
Mom calling to advise Dr Leonie Man office told her this could be approved with a peer to peer .  Mom is anxious to move forward without delay. Mom is concerned Stephen Castro is not like a migraine he has had in the past.  Mom asking fastest ways to do. If you cannot do peer to per, please advise fastest way to have this approved.

## 2019-05-04 NOTE — Telephone Encounter (Signed)
Dr. Nani Ravens please see note below and advise on how to proceed

## 2019-05-04 NOTE — Telephone Encounter (Signed)
Peer to peer complete. Authorization number is [id number]. Spoke w pt, gave him number for [facility] to contact as they have availability Sun. Also gave number to [facility] just in case. Told to take it easy as his headache is still present. He will follow up with the office regarding his mother on his HIPPA form.

## 2019-05-04 NOTE — Telephone Encounter (Signed)
Pt mom called back in and stated that we should be calling [employer] at [phone] for approval. She stated that she will pay for this of pocket if it is no approved.  They would like this setup as soon has possible.  Also advise office would like pt to call because mom is not on DPR

## 2019-05-07 ENCOUNTER — Other Ambulatory Visit: Payer: Self-pay

## 2019-05-07 ENCOUNTER — Ambulatory Visit (INDEPENDENT_AMBULATORY_CARE_PROVIDER_SITE_OTHER): Payer: BC Managed Care – PPO

## 2019-05-07 DIAGNOSIS — R51 Headache: Secondary | ICD-10-CM | POA: Diagnosis not present

## 2019-05-07 DIAGNOSIS — R519 Headache, unspecified: Secondary | ICD-10-CM

## 2019-05-07 DIAGNOSIS — H9319 Tinnitus, unspecified ear: Secondary | ICD-10-CM | POA: Diagnosis not present

## 2019-05-08 ENCOUNTER — Encounter: Payer: Self-pay | Admitting: Family Medicine

## 2019-05-08 ENCOUNTER — Other Ambulatory Visit: Payer: Self-pay | Admitting: Family Medicine

## 2019-05-08 DIAGNOSIS — I671 Cerebral aneurysm, nonruptured: Secondary | ICD-10-CM

## 2019-05-08 NOTE — Telephone Encounter (Signed)
Pt mother called and stated that [facility] did not receive fax. Please advise   FAX [phone]

## 2019-05-08 NOTE — Telephone Encounter (Signed)
Pt states he needs the CT and MRI scan sent to  [facility] neuro Phone:  [phone] (records dept)  Dr Harrison Mons or Dr Carl Best.  Pt states he is coming to the office to sign the hippa form. If he needs to sign release at that time he will.  But they will not see him at [facility] until they get the scans.

## 2019-05-08 NOTE — Telephone Encounter (Signed)
Printed off reports and got fax number for [facility] neurosurg.  Will fax now

## 2019-05-08 NOTE — Telephone Encounter (Signed)
Refaxed results to [facility].

## 2019-05-10 ENCOUNTER — Telehealth: Payer: Self-pay | Admitting: Family Medicine

## 2019-05-10 ENCOUNTER — Other Ambulatory Visit: Payer: Self-pay

## 2019-05-10 DIAGNOSIS — I671 Cerebral aneurysm, nonruptured: Secondary | ICD-10-CM

## 2019-05-10 NOTE — Telephone Encounter (Signed)
Pt's mother Levada Dy is requesting a call back from the office manager because she is upset that records has not been sent to [facility]. Mom says that she is going to look into finding another PCP for pt because she feels that son care has been poor the last few weeks.   Call back: [phone]

## 2019-05-10 NOTE — Telephone Encounter (Signed)
I called [name] neurosurgery today, it looks like Dr. Nani Castro actually sent the neurosurgery referral to [facility]-we had faxed [facility] imaging records, but they had not gotten a referral order. I placed this referral to [facility] today  Then called pt to try and clarify the situation He has an appt with [name] coming up on 7/17 with Dr. Ellene Castro Per Stephen Castro his headache is better now, but he is feeling very tired  I did speak with Dr. Ellene Castro today- they have a plan for his care in place, he has actually spoken to pt's mom himself.  He is having him see one of his vascular interventional partners as he does not specialize in this particular problem  I called [name] back and confirmed that all is set up, he states he does not need anything further from me at this time  He is seeing [name] on Monday - appt moved up

## 2019-05-15 DIAGNOSIS — I671 Cerebral aneurysm, nonruptured: Secondary | ICD-10-CM | POA: Diagnosis not present

## 2019-05-18 ENCOUNTER — Telehealth: Payer: Self-pay | Admitting: Neurology

## 2019-05-18 ENCOUNTER — Ambulatory Visit: Payer: BC Managed Care – PPO | Admitting: Neurology

## 2019-05-18 ENCOUNTER — Encounter: Payer: Self-pay | Admitting: Neurology

## 2019-05-18 ENCOUNTER — Other Ambulatory Visit: Payer: Self-pay

## 2019-05-18 VITALS — BP 154/100 | HR 82 | Temp 98.0°F | Ht 70.0 in | Wt 174.0 lb

## 2019-05-18 DIAGNOSIS — G44209 Tension-type headache, unspecified, not intractable: Secondary | ICD-10-CM | POA: Diagnosis not present

## 2019-05-18 DIAGNOSIS — I671 Cerebral aneurysm, nonruptured: Secondary | ICD-10-CM

## 2019-05-18 MED ORDER — DIVALPROEX SODIUM 500 MG PO DR TAB: 500.0000 mg | DELAYED_RELEASE_TABLET | Freq: Every day | ORAL | 2 refills | Status: DC

## 2019-05-18 NOTE — Progress Notes (Signed)
[facility] [address] [place]. [place] [address] [phone]       OFFICE CONSULT NOTE  [name] Date of Birth:  1991/03/13 Medical Record Number:  [id number]   Referring MD: Silvestre Mesi Reason for Referral: Headache and brain aneurysm  HPI: Stephen Castro is a pleasant 28 year old Caucasian male seen today for initial office consultation visit for headache and brain aneurysm.  History is obtained from the patient and his mother was accompanying him as well as review of electronic medical records and have personally reviewed imaging films in PACS.  He states that he has had remote history of migraine headaches but about a month ago he noticed a strain in his neck when he was lifting something.  His neck was sore for a few days and then as that feeling improved his started noticing intermittent headaches.  The headaches start at the back of the neck usually and then radiate up to the vertex and occasionally to the front.  Headaches are throbbing in nature and moderate to severe in intensity.  He denies accompanying nausea vomiting light or sound sensitivity but when the headache is severely has to lie down.  He points to a spot in the back of his neck on the left which is tender and is often the starting point of his headaches.  He did not find any relief by taking over-the-counter analgesics like Tylenol or Motrin.  He has been taking Fioricet which provides modest relief as well as Flexeril half a tablet which puts him to sleep.  He has been taking both tablets on a daily basis for the last several weeks.  He was seen in emergency room on 04/23/2019 and had a CT scan of the head which was unremarkable.  He was found to have evidence of sinusitis on the CT scan and was discharged on a course of amoxicillin which she finished and did not find any benefit in his headaches.  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  Otherwise no significant stenosis or narrowing of the large intracranial vessels.  Patient does have a strong family history of aneurysms on both the father and mother side.  Patient states in the last week or so the headache may be partially improving but not gone yet.  He does state that he is felt a little emotional and low because of the headaches persisting for so long.  He was referred to see Dr. Kathyrn Sheriff endovascular neurosurgeon who recommended conservative follow-up of the infundibulum/aneurysm with surveillance MRAs at 1 or 2-year interval studies.  ROS:   14 system review of systems is positive for headache, neck pain, restriction of neck movements, anxiety, sinusitis and all other systems negative  PMH:  Past Medical History:  Diagnosis Date   Aneurysm ([place]) 2020   Anxiety    managed per pt   Depression    "haven't really dealt with" in "quite some time"   High blood pressure    slightly elevated    Social History:  Social History   Socioeconomic History   Marital status: Significant Other    Spouse name: Not on file   Number of children: Not on file   Years of education: Not on file  Highest education level: Some college, no degree  Occupational History   Not on file  Social Needs   Financial resource strain: Not on file   Food insecurity    Worry: Not on file    Inability: Not on file   Transportation needs    Medical: Not on file    Non-medical: Not on file  Tobacco Use   Smoking status: Current Every Day Smoker    Packs/day: 0.50    Types: Cigarettes   Smokeless tobacco: Never Used  Substance and Sexual Activity   Alcohol use: No    Comment: Stopped drinking a year and a half ago   Drug use: No   Sexual activity: Yes    Birth control/protection:  Condom  Lifestyle   Physical activity    Days per week: Not on file    Minutes per session: Not on file   Stress: Not on file  Relationships   Social connections    Talks on phone: Not on file    Gets together: Not on file    Attends religious service: Not on file    Active member of club or organization: Not on file    Attends meetings of clubs or organizations: Not on file    Relationship status: Not on file   Intimate partner violence    Fear of current or ex partner: Not on file    Emotionally abused: Not on file    Physically abused: Not on file    Forced sexual activity: Not on file  Other Topics Concern   Not on file  Social History Narrative   Lives at home with his fiance [name]   Right handed   Caffeine: intermittently a red bull, not daily     Medications:   Current Outpatient Medications on File Prior to Visit  Medication Sig Dispense Refill   albuterol (PROVENTIL HFA;VENTOLIN HFA) 108 (90 Base) MCG/ACT inhaler Inhale 2 puffs into the lungs every 6 (six) hours as needed for wheezing or shortness of breath. 1 Inhaler 0   butalbital-acetaminophen-caffeine (FIORICET) 50-325-40 MG tablet Take 1-2 tablets by mouth every 6 (six) hours as needed for headache. Max 6 per 24 hours 20 tablet 0   cyclobenzaprine (FLEXERIL) 10 MG tablet Take 0.5-1 tablets (5-10 mg total) by mouth 3 (three) times daily as needed for muscle spasms. 21 tablet 0   ibuprofen (ADVIL) 200 MG tablet Take 400 mg by mouth every 6 (six) hours as needed for moderate pain.     No current facility-administered medications on file prior to visit.     Allergies:  No Known Allergies  Physical Exam General: well developed, well nourished, seated, in no evident distress Head: head normocephalic and atraumatic.   Neck: supple with no carotid or supraclavicular bruits Cardiovascular: regular rate and rhythm, no murmurs Musculoskeletal: no deformity.  Point tenderness left posterior cervical region.   Mild restriction of neck extension and rotation to the left. Skin:  no rash/petichiae Vascular:  Normal pulses all extremities  Neurologic Exam Mental Status: Awake and fully alert. Oriented to place and time. Recent and remote memory intact. Attention span, concentration and fund of knowledge appropriate. Mood and affect appropriate.  Cranial Nerves: Fundoscopic exam reveals sharp disc margins. Pupils equal, briskly reactive to light. Extraocular movements full without nystagmus. Visual fields full to confrontation. Hearing intact. Facial sensation intact. Face, tongue, palate moves normally and symmetrically.  Motor: Normal bulk and tone. Normal strength in all tested extremity muscles. Sensory.: intact to  touch , pinprick , position and vibratory sensation.  Coordination: Rapid alternating movements normal in all extremities. Finger-to-nose and heel-to-shin performed accurately bilaterally. Gait and Station: Arises from chair without difficulty. Stance is normal. Gait demonstrates normal stride length and balance . Able to heel, toe and tandem walk without difficulty.  Reflexes: 1+ and symmetric. Toes downgoing.       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  MRI of the brain shows tiny incidental 2 to 3 mm dilatation in the cavernous carotid possibly infundibulum versus small aneurysm.  He has a strong family history of intracranial aneurysms.    PLAN: I had a long discussion with the patient and his mother regarding his new onset of daily headaches which sound like muscle tension headaches.  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  Given his strong family history of ruptured cerebral aneurysms I would recommend follow-up with checking CT angiogram as patient seems reluctant to do diagnostic cerebral catheter angiogram  to confirm this.  I strongly encouraged him to quit smoking as well as monitor his blood pressure and follow-up with his primary care physician closely for smoking cessation help as well as blood pressure monitoring and treatment.  Trial of Depakote ER 500 mg daily for his headaches and advised him to limit using Fioricet and Flexeril for not more than 1 or 2 days/week to reduce medication overuse headache.  I also advised him to do regular neck stretching exercises.  Greater than 50% time during this 45-minute consultation visit was spent on counseling and coordination of care about his tension headaches as well as small possible intracranial aneurysm versus infundibulum discussion he will return for follow-up in the future in 2 months or call earlier if necessary. Antony Contras, MD  [facility] [address] [place], [place] [address]  Phone [phone] Fax [phone] Note: This document was prepared with digital dictation and possible smart phrase technology. Any transcriptional errors that result from this process are unintentional.

## 2019-05-18 NOTE — Patient Instructions (Signed)
I had a long discussion with the patient and his mother regarding his new onset of daily headaches which sound like muscle tension headaches.  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  Given his strong family history of ruptured cerebral aneurysms I would recommend follow-up with checking CT angiogram as patient seems reluctant to do diagnostic cerebral catheter angiogram to confirm this.  I strongly encouraged him to quit smoking as well as monitor his blood pressure and follow-up with his primary care physician closely for smoking cessation help as well as blood pressure monitoring and treatment.  Trial of Depakote ER 500 mg daily for his headaches and advised him to limit using Fioricet and Flexeril for not more than 1 or 2 days/week to reduce medication overuse headache.  I also advised him to do regular neck stretching exercises.  He will return for follow-up in the future in 2 months or call earlier if necessary.  Neck Exercises Ask your health care provider which exercises are safe for you. Do exercises exactly as told by your health care provider and adjust them as directed. It is normal to feel mild stretching, pulling, tightness, or discomfort as you do these exercises. Stop right away if you feel sudden pain or your pain gets worse. Do not begin these exercises until told by your health care provider. Neck exercises can be important for many reasons. They can improve strength and maintain flexibility in your neck, which will help your upper back and prevent neck pain. Stretching exercises Rotation neck stretching  1. Sit in a chair or stand up. 2. Place your feet flat on the floor, shoulder width apart. 3. Slowly turn your head (rotate) to the right until a slight stretch is felt. Turn it all the way to the right so you can look over your right shoulder. Do not tilt or tip your head. 4. Hold this position for 10-30  seconds. 5. Slowly turn your head (rotate) to the left until a slight stretch is felt. Turn it all the way to the left so you can look over your left shoulder. Do not tilt or tip your head. 6. Hold this position for 10-30 seconds. Repeat __________ times. Complete this exercise __________ times a day. Neck retraction 1. Sit in a sturdy chair or stand up. 2. Look straight ahead. Do not bend your neck. 3. Use your fingers to push your chin backward (retraction). Do not bend your neck for this movement. Continue to face straight ahead. If you are doing the exercise properly, you will feel a slight sensation in your throat and a stretch at the back of your neck. 4. Hold the stretch for 1-2 seconds. Repeat __________ times. Complete this exercise __________ times a day. Strengthening exercises Neck press 1. Lie on your back on a firm bed or on the floor with a pillow under your head. 2. Use your neck muscles to push your head down on the pillow and straighten your spine. 3. Hold the position as well as you can. Keep your head facing up (in a neutral position) and your chin tucked. 4. Slowly count to 5 while holding this position. Repeat __________ times. Complete this exercise __________ times a day. Isometrics These are exercises in which you strengthen the muscles in your neck while keeping your neck still (isometrics). 1. Sit in a supportive chair and place your hand on your forehead. 2. Keep your head and face facing  straight ahead. Do not flex or extend your neck while doing isometrics. 3. Push forward with your head and neck while pushing back with your hand. Hold for 10 seconds. 4. Do the sequence again, this time putting your hand against the back of your head. Use your head and neck to push backward against the hand pressure. 5. Finally, do the same exercise on either side of your head, pushing sideways against the pressure of your hand. Repeat __________ times. Complete this exercise  __________ times a day. Prone head lifts 1. Lie face-down (prone position), resting on your elbows so that your chest and upper back are raised. 2. Start with your head facing downward, near your chest. Position your chin either on or near your chest. 3. Slowly lift your head upward. Lift until you are looking straight ahead. Then continue lifting your head as far back as you can comfortably stretch. 4. Hold your head up for 5 seconds. Then slowly lower it to your starting position. Repeat __________ times. Complete this exercise __________ times a day. Supine head lifts 1. Lie on your back (supine position), bending your knees to point to the ceiling and keeping your feet flat on the floor. 2. Lift your head slowly off the floor, raising your chin toward your chest. 3. Hold for 5 seconds. Repeat __________ times. Complete this exercise __________ times a day. Scapular retraction 1. Stand with your arms at your sides. Look straight ahead. 2. Slowly pull both shoulders (scapulae) backward and downward (retraction) until you feel a stretch between your shoulder blades in your upper back. 3. Hold for 10-30 seconds. 4. Relax and repeat. Repeat __________ times. Complete this exercise __________ times a day. Contact a health care provider if:  Your neck pain or discomfort gets much worse when you do an exercise.  Your neck pain or discomfort does not improve within 2 hours after you exercise. If you have any of these problems, stop exercising right away. Do not do the exercises again unless your health care provider says that you can. Get help right away if:  You develop sudden, severe neck pain. If this happens, stop exercising right away. Do not do the exercises again unless your health care provider says that you can. This information is not intended to replace advice given to you by your health care provider. Make sure you discuss any questions you have with your health care  provider. Document Released: 09/30/2015 Document Revised: 08/17/2018 Document Reviewed: 08/17/2018 Elsevier Patient Education  2020 [employer].  Tension Headache, Adult A tension headache is a feeling of pain, pressure, or aching in the head that is often felt over the front and sides of the head. The pain can be dull, or it can feel tight (constricting). There are two types of tension headache:  Episodic tension headache. This is when the headaches happen fewer than 15 days a month.  Chronic tension headache. This is when the headaches happen more than 15 days a month during a 65-month period. A tension headache can last from 30 minutes to several days. It is the most common kind of headache. Tension headaches are not normally associated with nausea or vomiting, and they do not get worse with physical activity. What are the causes? The exact cause of this condition is not known. Tension headaches are often triggered by stress, anxiety, or depression. Other triggers include:  Alcohol.  Too much caffeine or caffeine withdrawal.  Respiratory infections, such as colds, flu, or sinus infections.  Dental problems or teeth clenching.  Tiredness (fatigue).  Holding your head and neck in the same position for a long period of time, such as while using a computer.  Smoking.  Arthritis of the neck. What are the signs or symptoms? Symptoms of this condition include:  A feeling of pressure or tightness around the head.  Dull, aching head pain.  Pain over the front and sides of the head.  Tenderness in the muscles of the head, neck, and shoulders. How is this diagnosed? This condition may be diagnosed based on your symptoms, your medical history, and a physical exam. If your symptoms are severe or unusual, you may have imaging tests, such as a CT scan or an MRI of your head. Your vision may also be checked. How is this treated? This condition may be treated with lifestyle changes and  with medicines that help relieve symptoms. Follow these instructions at home: Managing pain  Take over-the-counter and prescription medicines only as told by your health care provider.  When you have a headache, lie down in a dark, quiet room.  If directed, apply ice to the head and neck: ? Put ice in a plastic bag. ? Place a towel between your skin and the bag. ? Leave the ice on for 20 minutes, 2-3 times a day.  If directed, apply heat to the back of your neck as often as told by your health care provider. Use the heat source that your health care provider recommends, such as a moist heat pack or a heating pad. ? Place a towel between your skin and the heat source. ? Leave the heat on for 20-30 minutes. ? Remove the heat if your skin turns bright red. This is especially important if you are unable to feel pain, heat, or cold. You may have a greater risk of getting burned. Eating and drinking  Eat meals on a regular schedule.  Limit alcohol intake to no more than 1 drink a day for nonpregnant women and 2 drinks a day for men. One drink equals 12 oz of beer, 5 oz of wine, or 1 oz of hard liquor.  Drink enough fluid to keep your urine pale yellow.  Decrease your caffeine intake, or stop using caffeine. Lifestyle  Get 7-9 hours of sleep each night, or get the amount of sleep recommended by your health care provider.  At bedtime, remove all electronic devices from your room. Electronic devices include computers, phones, and tablets.  Find ways to manage your stress. Some things that can help relieve stress include: ? Exercise. ? Deep breathing exercises. ? Yoga. ? Listening to music. ? Positive mental imagery.  Try to sit up straight and avoid tensing your muscles.  Do not use any products that contain nicotine or tobacco, such as cigarettes and e-cigarettes. If you need help quitting, ask your health care provider. General instructions   Keep all follow-up visits as told  by your health care provider. This is important.  Avoid any headache triggers. Keep a headache journal to help find out what may trigger your headaches. For example, write down: ? What you eat and drink. ? How much sleep you get. ? Any change to your diet or medicines. Contact a health care provider if:  Your headache does not get better.  Your headache comes back.  You are sensitive to sounds, light, or smells because of a headache.  You have nausea or you vomit.  Your stomach hurts. Get help right away if:  You suddenly develop a very severe headache along with any of the following: ? A stiff neck. ? Nausea and vomiting. ? Confusion. ? Weakness. ? Double vision or loss of vision. ? Shortness of breath. ? Rash. ? Unusual sleepiness. ? Fever. ? Trouble speaking. ? Pain in your eyes or ears. ? Trouble walking or balancing. ? Feeling faint or passing out. Summary  A tension headache is a feeling of pain, pressure, or aching in the head that is often felt over the front and sides of the head.  A tension headache can last from 30 minutes to several days. It is the most common kind of headache.  This condition may be diagnosed based on your symptoms, your medical history, and a physical exam.  This condition may be treated with lifestyle changes and with medicines that help relieve symptoms. This information is not intended to replace advice given to you by your health care provider. Make sure you discuss any questions you have with your health care provider. Document Released: 10/19/2005 Document Revised: 10/01/2017 Document Reviewed: 01/29/2017 Elsevier Patient Education  2020 [employer].

## 2019-05-18 NOTE — Telephone Encounter (Signed)
[employer] Auth: [id number] (exp. 05/18/19 to 11/13/19) order sent to GI. They will reach out to the patient to schedule.

## 2019-05-26 ENCOUNTER — Telehealth: Payer: Self-pay | Admitting: Neurology

## 2019-05-26 ENCOUNTER — Other Ambulatory Visit: Payer: Self-pay

## 2019-05-26 NOTE — Telephone Encounter (Signed)
I called and spoke to the patient.  He informed me that the Depakote was helping but it was making him sleepy after he took it and he takes it at lunchtime every day.  I recommend that he switch to taking it after dinner.  If he was able to tolerate it better this way and headaches are still frequent I may consider increasing the dose.  I asked him to call me back in 1 week to give me an update.  He voiced understanding.

## 2019-05-26 NOTE — Telephone Encounter (Signed)
Pt mother(on DPR) has called asking that Dr Leonie Man and the on call Dr both be made aware that the divalproex (DEPAKOTE) 500 MG DR tablet has not helped pt.  Pt feels very disoriented, very tired and his head is killing him.  Pt mother states Dr Leonie Man told pt to call and let him know how the medication has helped, mother states it has not helped at all and she has asked that the on call Dr calls her to discuss options for pt

## 2019-05-28 NOTE — Progress Notes (Addendum)
[facility] [address], [place], [place] [address] [phone]  Date:  05/29/2019   Name:  Stephen Castro   DOB:  February 17, 1991   MRN:  [id number]  PCP:  Darreld Mclean, MD    Chief Complaint: Hypertension (borught readings, however he states he took xanax prior to angio,, drowsiness, headache, chills)   History of Present Illness:  Stephen Castro is a 28 y.o. very pleasant male patient who presents with the following:  Pt who recently had an unusual HA and was seen at the ER on 6/21- he had a CT of his head that was negative, HA thought due to sinus infection Followed up with Dr. Nani Ravens on 6/30- at that point he continued to have HA, we ordered an MRI which showed an aneurysm as below, then had a CT angio just today:    Ct Angio Head W Or Wo Contrast  Result Date: 05/29/2019 CLINICAL DATA:  28 year old male with family history of aneurysm, recent headaches and tinnitus. 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 EXAM: CT ANGIOGRAPHY HEAD TECHNIQUE: Multidetector CT imaging of the head was performed using the standard protocol during bolus administration of intravenous contrast. Multiplanar CT image reconstructions and MIPs were obtained to evaluate the vascular anatomy. CONTRAST:  70mL ISOVUE-370 IOPAMIDOL (ISOVUE-370) INJECTION 76% COMPARISON:  Intracranial MRA 05/07/2019.  Head CT 04/23/2019. FINDINGS: CT HEAD Brain: Normal cerebral volume. No midline shift, ventriculomegaly, mass effect, evidence of mass lesion, intracranial hemorrhage or evidence of cortically based acute infarction. Gray-white matter differentiation is within normal limits throughout the brain. Calvarium and skull base: Stable, negative. Congenital incomplete ossification of the posterior C1 ring. Paranasal sinuses: Tympanic cavities and mastoids remain clear. Scattered mild to moderate bilateral  nasal cavity mucosal thickening redemonstrated, most pronounced in the right maxillary alveolar recess. Paranasal sinuses are mildly hyperplastic. No sinus fluid level or bubbly opacity identified. Orbits: Visualized orbits and scalp soft tissues are within normal limits. CTA HEAD Posterior circulation: Dominant left vertebral artery redemonstrated. Normal left V4 segment, left PICA origin and vertebrobasilar junction. Diminutive right vertebral arteries *CRASH * that diminutive distal right vertebral artery with normal right PICA origin. Normal basilar artery. Normal SCA origins (the left is duplicated, normal variant). Normal PCA origins. Posterior communicating arteries are diminutive or absent. Bilateral PCA branches are within normal limits. Anterior circulation: The tiny contour abnormality along the cavernous right ICA is redemonstrated on series 15, image 51 but was more apparent by MRA. There is mild tortuosity of the distal cervical right ICA, as well as both ICA siphons. There is no siphon plaque or stenosis. Ophthalmic artery origins are normal. Carotid termini, MCA and ACA origins are normal. Anterior communicating artery and bilateral ACA branches are within normal limits. Left MCA M1 segment, bifurcation and left MCA branches are within normal limits. Right MCA M1 segment, bifurcation, and right MCA branches are within normal limits. Venous sinuses: Patent. Anatomic variants: Dominant left vertebral artery, the right is diminutive. 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 Review of the MIP images confirms the above findings IMPRESSION: 1. Stable and subtle by CTA tiny infundibulum or aneurysm of the right ICA siphon seen on series 15, image 51 today. This is in the cavernous segment, proximal to the anterior genu. 2. Otherwise normal intracranial CTA and CT appearance of the brain. Electronically Signed   By: Genevie Ann M.D.   On: 05/29/2019  14:41   Mr Angiogram Head Wo Contrast  Result Date: 05/07/2019 CLINICAL  DATA:  28 year old male with headache for 2 weeks radiating from the skull base to the orbits. Associated tinnitus. Family history of aneurysm. EXAM: MRA HEAD WITHOUT CONTRAST TECHNIQUE: Angiographic images of the Circle of Willis were obtained using MRA technique without intravenous contrast. COMPARISON:  Head CT without contrast 04/23/2019 and earlier. FINDINGS: No intracranial mass effect or ventriculomegaly. Antegrade flow in the posterior circulation with dominant left vertebral artery which primarily supplies the basilar (normal variant). The distal right vertebral artery functionally terminates in PICA (normal variant) a mom. Normal bilateral PICA origins. Patent vertebrobasilar junction and basilar artery. Normal SCA and PCA origins. Posterior communicating arteries are diminutive or absent. Bilateral PCA branches are normal. No abnormal flow signal in the posterior fossa or about the temporal bones. Antegrade flow in both ICA siphons. Mildly tortuous visible distal cervical right ICA. Mildly tortuous bilateral cavernous carotids. No siphon stenosis. Normal ophthalmic artery origins. There is a small 2-3 millimeter branching or saccular outpouching from the distal cavernous segment of the right ICA visible on series 6, image 59. Otherwise the right siphon is normal. Normal carotid termini. Normal MCA and ACA origins. Anterior communicating artery and visible ACA branches are within normal limits. Left MCA M1 segment, bifurcation, and visible left MCA branches are within normal limits. Right MCA M1 segment, bifurcation, and visible right MCA branches are within normal limits. IMPRESSION: 1. 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 But if a tiny aneurysm, since this should be extradural, rupture would result not in subarachnoid hemorrhage but in carotid-cavernous fistula. Given the patient's young age Endovascular  Neurosurgery or Neuro-Interventional Radiology consultation is suggested to evaluate the appropriateness of potential treatment. 2. Otherwise normal intracranial MRA. These results will be called to the ordering clinician or representative by the Radiologist Assistant, and communication documented in the PACS or zVision Dashboard. Electronically Signed   By: Genevie Ann M.D.   On: 05/07/2019 19:33   He then saw neurology on 7/16, Dr. Leonie Man I had a long discussion with the patient and his mother regarding his new onset of daily headaches which sound like muscle tension headaches.  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  Given his strong family history of ruptured cerebral aneurysms I would recommend follow-up with checking CT angiogram as patient seems reluctant to do diagnostic cerebral catheter angiogram to confirm this.  I strongly encouraged him to quit smoking as well as monitor his blood pressure and follow-up with his primary care physician closely for smoking cessation help as well as blood pressure monitoring and treatment.  Trial of Depakote ER 500 mg daily for his headaches and advised him to limit using Fioricet and Flexeril for not more than 1 or 2 days/week to reduce medication overuse headache.  I also advised him to do regular neck stretching exercises.    Has also seen neurosurgery who recommended conservative follow-up with repeat MRA in 1-2 years   BP Readings from Last 3 Encounters:  05/29/19 (!) 158/100  05/18/19 (!) 154/100  05/02/19 (!) 142/100   Looking back his BP was slightly elevated in 3/19 when he had a shoulder injury.  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  His fiance joined in our discussion on speaker phone today   Patient Active Problem List   Diagnosis Date Noted  . Gastroenteritis 04/13/2019  . Shoulder injury, left, initial encounter 01/24/2018  . ADHD (attention deficit hyperactivity disorder) 04/04/2013  . Anxiety state, unspecified 04/04/2013    Past Medical History:  Diagnosis Date  . Aneurysm ([facility]) 2020  . Anxiety    managed per pt  . Depression    "haven't really dealt with" in "quite some time"  . High blood pressure    slightly elevated    No past surgical history on file.  Social History   Tobacco Use  . Smoking status: Current Every Day Smoker    Packs/day: 0.50    Types: Cigarettes  . Smokeless tobacco: Never Used  Substance Use Topics  . Alcohol use: No    Comment: Stopped drinking a year and a half ago  . Drug use: No    Family History  Problem Relation Age of Onset  . Stroke Mother        carotid artery dissection during massage   . Aneurysm Paternal Grandmother   . Hypertension Other        both sides of family  . Lung cancer Other        both sides of family   . Cerebral aneurysm Maternal Uncle     No Known Allergies  Medication list has been reviewed and updated.  Current Outpatient Medications on File Prior to Visit  Medication Sig Dispense Refill  . albuterol (PROVENTIL HFA;VENTOLIN HFA) 108 (90 Base) MCG/ACT inhaler Inhale 2 puffs into the lungs every 6 (six) hours as needed for wheezing or shortness of breath. 1 Inhaler 0  . cyclobenzaprine (FLEXERIL) 10 MG tablet Take 0.5-1 tablets (5-10 mg total) by mouth 3 (three) times daily as needed for muscle spasms. 21 tablet 0  . divalproex (DEPAKOTE) 500 MG DR tablet Take 1 tablet (500 mg total)  by mouth daily. 30 tablet 2  . ibuprofen (ADVIL) 200 MG tablet Take 400 mg by mouth every 6 (six) hours as needed for moderate pain.     No current facility-administered medications on file prior to visit.     Review of Systems:  As per HPI- otherwise negative. No fever noted   Physical Examination: Vitals:   05/29/19 1548  BP: (!) 158/100  Pulse: 80  Resp: 16  Temp: 98 F (36.7 C)  SpO2: 96%   Vitals:   05/29/19 1548  Weight: 175 lb (79.4 kg)  Height: 5\' 10"  (1.778 m)   Body mass index is 25.11 kg/m. Ideal Body Weight: Weight in (lb) to have BMI = 25: 173.9  GEN: WDWN, NAD, Non-toxic, A & O x 3, normal weight, looks well  HEENT: Atraumatic, Normocephalic. Neck supple. No masses, No LAD. Ears and Nose: No external deformity. CV: RRR, No M/G/R. No JVD. No thrill. No extra heart sounds. PULM: CTA B, no wheezes, crackles, rhonchi. No retractions. No resp. distress. No accessory muscle  use. ABD: S, NT, ND EXTR: No c/c/e NEURO Normal gait.  PSYCH: Normally interactive. Conversant. Not depressed or anxious appearing.  Calm demeanor.   EKG: no comparison available.  SR with incomplete RBBB- possible LVH Assessment and Plan:   ICD-10-CM   1. Hypertension, unspecified type  I10 EKG 12-Lead    losartan (COZAAR) 50 MG tablet    ECHOCARDIOGRAM COMPLETE    Lipid panel    Hemoglobin A1c    Comprehensive metabolic panel    TSH    CANCELED: Comprehensive metabolic panel    CANCELED: TSH  2. Screening for hyperlipidemia  Z13.220 Lipid panel  3. 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  This is being evaluated by neurology and NSG.  He has also been noted to have elevated BP, both at MD office and at home.  Etiology of HTN is not clear yet, but will go ahead and start him on a moderate dose ARB Labs pending as above EKG is slightly abnl- will obtain echo  for more information   Follow-up: No follow-ups on file.  Meds ordered this encounter  Medications  . losartan (COZAAR) 50 MG tablet    Sig: Start with 25 mg daily- increase to 50 as directed by MD    Dispense:  90 tablet    Refill:  3   Orders Placed This Encounter  Procedures  . Lipid panel  . Hemoglobin A1c  . Comprehensive metabolic panel  . TSH  . EKG 12-Lead  . ECHOCARDIOGRAM COMPLETE    @SIGN @    Signed Lamar Blinks, MD  Received his labs 7/28- message to pt  Results for orders placed or performed in visit on 05/29/19  Lipid panel  Result Value Ref Range   Cholesterol 185 0 - 200 mg/dL   Triglycerides 133.0 0.0 - 149.0 mg/dL   HDL 60.60 >39.00 mg/dL   VLDL 26.6 0.0 - 40.0 mg/dL   LDL Cholesterol 98 0 - 99 mg/dL   Total CHOL/HDL Ratio 3    NonHDL 124.51   Hemoglobin A1c  Result Value Ref Range   Hgb A1c MFr Bld 5.4 4.6 - 6.5 %  Comprehensive metabolic panel  Result Value Ref Range   Sodium 138 135 - 145 mEq/L   Potassium 4.4 3.5 - 5.1 mEq/L   Chloride 99 96 - 112 mEq/L   CO2 31 19 - 32 mEq/L   Glucose, Bld 80 70 - 99 mg/dL   BUN 12 6 - 23 mg/dL   Creatinine, Ser 1.06 0.40 - 1.50 mg/dL   Total Bilirubin 0.5 0.2 - 1.2 mg/dL   Alkaline Phosphatase 89 39 - 117 U/L   AST 46 (H) 0 - 37 U/L   ALT 42 0 - 53 U/L   Total Protein 7.4 6.0 - 8.3 g/dL   Albumin 4.5 3.5 - 5.2 g/dL   Calcium 10.0 8.4 - 10.5 mg/dL   GFR 81.96 >60.00 mL/min  TSH  Result Value Ref Range   TSH 2.78 0.35 - 4.50 uIU/mL

## 2019-05-29 ENCOUNTER — Encounter: Payer: Self-pay | Admitting: Family Medicine

## 2019-05-29 ENCOUNTER — Ambulatory Visit: Payer: BC Managed Care – PPO | Admitting: Family Medicine

## 2019-05-29 ENCOUNTER — Ambulatory Visit
Admission: RE | Admit: 2019-05-29 | Discharge: 2019-05-29 | Disposition: A | Discharge status: Home / Self Care | Payer: BC Managed Care – PPO | Source: Ambulatory Visit | Attending: Neurology | Admitting: Neurology

## 2019-05-29 ENCOUNTER — Other Ambulatory Visit: Payer: Self-pay

## 2019-05-29 VITALS — BP 158/100 | HR 80 | Temp 98.0°F | Resp 16 | Ht 70.0 in | Wt 175.0 lb

## 2019-05-29 DIAGNOSIS — I10 Essential (primary) hypertension: Secondary | ICD-10-CM

## 2019-05-29 DIAGNOSIS — Z13220 Encounter for screening for lipoid disorders: Secondary | ICD-10-CM

## 2019-05-29 DIAGNOSIS — I671 Cerebral aneurysm, nonruptured: Secondary | ICD-10-CM

## 2019-05-29 DIAGNOSIS — Z131 Encounter for screening for diabetes mellitus: Secondary | ICD-10-CM

## 2019-05-29 DIAGNOSIS — R51 Headache: Secondary | ICD-10-CM | POA: Diagnosis not present

## 2019-05-29 MED ORDER — LOSARTAN POTASSIUM 50 MG PO TABS: ORAL_TABLET | ORAL | 3 refills | Status: DC

## 2019-05-29 MED ORDER — IOPAMIDOL (ISOVUE-370) INJECTION 76%
75.0000 mL | Freq: Once | INTRAVENOUS | Status: AC | PRN
  Administered 2019-05-29: 75 mL via INTRAVENOUS

## 2019-05-29 NOTE — Patient Instructions (Addendum)
It was good to see you again today, but I am sorry you are having a hard time We are doing to start you on 25 mg of losartan for your blood pressure- goal BP is under 130/85 If we don't get there on 25 mg can increase to 50 Your EKG *may* indicate increase in size of the left ventricle muscle.  We are going to get an echocardiogram for you which will give [place] much better info   Please let me know how your BP responds to the losartan, and if any side effects

## 2019-05-30 ENCOUNTER — Encounter: Payer: Self-pay | Admitting: Family Medicine

## 2019-05-30 LAB — LIPID PANEL
Cholesterol: 185 mg/dL (ref 0–200)
HDL: 60.6 mg/dL (ref >39.00)
LDL Cholesterol: 98 mg/dL (ref 0–99)
NonHDL: 124.51
Total CHOL/HDL Ratio: 3
Triglycerides: 133 mg/dL (ref 0.0–149.0)
VLDL: 26.6 mg/dL (ref 0.0–40.0)

## 2019-05-30 LAB — COMPREHENSIVE METABOLIC PANEL
ALT: 42 U/L (ref 0–53)
AST: 46 U/L — ABNORMAL HIGH (ref 0–37)
Albumin: 4.5 g/dL (ref 3.5–5.2)
Alkaline Phosphatase: 89 U/L (ref 39–117)
BUN: 12 mg/dL (ref 6–23)
CO2: 31 mEq/L (ref 19–32)
Calcium: 10 mg/dL (ref 8.4–10.5)
Chloride: 99 mEq/L (ref 96–112)
Creatinine, Ser: 1.06 mg/dL (ref 0.40–1.50)
GFR: 81.96 mL/min (ref >60.00)
Glucose, Bld: 80 mg/dL (ref 70–99)
Potassium: 4.4 mEq/L (ref 3.5–5.1)
Sodium: 138 mEq/L (ref 135–145)
Total Bilirubin: 0.5 mg/dL (ref 0.2–1.2)
Total Protein: 7.4 g/dL (ref 6.0–8.3)

## 2019-05-30 LAB — HEMOGLOBIN A1C: Hgb A1c MFr Bld: 5.4 % (ref 4.6–6.5)

## 2019-05-30 LAB — TSH: TSH: 2.78 u[IU]/mL (ref 0.35–4.50)

## 2019-05-31 ENCOUNTER — Other Ambulatory Visit: Payer: Self-pay

## 2019-05-31 ENCOUNTER — Ambulatory Visit (HOSPITAL_COMMUNITY): Payer: BC Managed Care – PPO | Attending: Internal Medicine

## 2019-05-31 DIAGNOSIS — I10 Essential (primary) hypertension: Secondary | ICD-10-CM | POA: Diagnosis not present

## 2019-06-01 ENCOUNTER — Encounter: Payer: Self-pay | Admitting: Family Medicine

## 2019-06-03 NOTE — Telephone Encounter (Signed)
  I agree with your management. The LV was mildly thickened which could be due to hypertension. His diastolic function is normal and otherwise the echo looked pretty good. I don't think he needs to be evaluated by [place] for this - just target 120/80 for BP with the ARB.

## 2019-06-03 NOTE — Telephone Encounter (Signed)
-----   Message from Pixie Casino, MD sent at 06/02/2019  8:20 AM EDT ----- Janett Billow  I agree with your management. The LV was mildly thickened which could be due to hypertension. His diastolic function is normal and otherwise the echo looked pretty good. I don't think he needs to be evaluated by [place] for this - just target 120/80 for BP with the ARB.  Thanks.  -Mali ----- Message ----- From: Darreld Mclean, MD Sent: 06/01/2019   5:29 PM EDT To: Pixie Casino, MD  Salley Scarlet Chrissie Noa- thanks for reading this echo Kensley is a 28 year old guy was who recently diagnosed with a small brain aneurysm during w/u for HA. He also has been noted to have HBP for the last few months.  I just started him on losartan.  He is a thin guy and otherwise generally in good health  Do you think cardiology should see him in clinic for his LV wall thickness issue?  His HTN is unexpected  Thanks so much JC

## 2019-06-03 NOTE — Telephone Encounter (Signed)
-----   Message from Pixie Casino, MD sent at 06/02/2019  8:20 AM EDT ----- Janett Billow  I agree with your management. The LV was mildly thickened which could be due to hypertension. His diastolic function is normal and otherwise the echo looked pretty good. I don't think he needs to be evaluated by [place] for this - just target 120/80 for BP with the ARB.  Thanks.  -Mali ----- Message ----- From: Darreld Mclean, MD Sent: 06/01/2019   5:29 PM EDT To: Pixie Casino, MD  Salley Scarlet Chrissie Noa- thanks for reading this echo Taevyn is a 28 year old guy was who recently diagnosed with a small brain aneurysm during w/u for HA. He also has been noted to have HBP for the last few months.  I just started him on losartan.  He is a thin guy and otherwise generally in good health  Do you think cardiology should see him in clinic for his LV wall thickness issue?  His HTN is unexpected  Thanks so much JC

## 2019-06-05 NOTE — Telephone Encounter (Signed)
Patient advised of provider's comments and recommendations. He asked if he should follow up with neurology, per Dr. Lorelei Pont, yes he needs to call them for follow up.

## 2019-06-05 NOTE — Telephone Encounter (Signed)
Pt has called to inform that he has stopped taking the divalproex (DEPAKOTE) 500 MG DR tablet due to side effects affecting his mood and causing nightmares.  Pt is asking for a call to discuss what he can take in place of this medication.

## 2019-06-06 ENCOUNTER — Ambulatory Visit (INDEPENDENT_AMBULATORY_CARE_PROVIDER_SITE_OTHER): Payer: BC Managed Care – PPO | Admitting: Internal Medicine

## 2019-06-06 ENCOUNTER — Ambulatory Visit: Payer: Self-pay | Admitting: Family Medicine

## 2019-06-06 DIAGNOSIS — M542 Cervicalgia: Secondary | ICD-10-CM | POA: Diagnosis not present

## 2019-06-06 DIAGNOSIS — I160 Hypertensive urgency: Secondary | ICD-10-CM

## 2019-06-06 DIAGNOSIS — G44201 Tension-type headache, unspecified, intractable: Secondary | ICD-10-CM | POA: Diagnosis not present

## 2019-06-06 DIAGNOSIS — M545 Low back pain: Secondary | ICD-10-CM | POA: Diagnosis not present

## 2019-06-06 MED ORDER — AMLODIPINE BESYLATE 10 MG PO TABS: 10.0000 mg | ORAL_TABLET | Freq: Every day | ORAL | 1 refills | Status: DC

## 2019-06-06 NOTE — Progress Notes (Signed)
Subjective:    Patient ID: Stephen Castro, male    DOB: Oct 23, 1991, 28 y.o.   MRN: [id number]  DOS:  06/06/2019 Type of visit - description: Virtual Visit via Video Note  I connected with@   by a video enabled telemedicine application and verified that I am speaking with the correct person using two identifiers.   THIS ENCOUNTER IS A VIRTUAL VISIT DUE TO COVID-19 - PATIENT WAS NOT SEEN IN THE OFFICE. PATIENT HAS CONSENTED TO VIRTUAL VISIT / TELEMEDICINE VISIT   Location of patient: home  Location of provider: office  I discussed the limitations of evaluation and management by telemedicine and the availability of in person appointments. The patient expressed understanding and agreed to proceed.  History of Present Illness: Acute The patient recent medical history is reviewed. He has a history of elevated BP, new onset of headaches, status post evaluation by neurology, had brain MRI MRA and CT head angiogram with question of infundibulum or aneurysm at the right ICA. He is currently taking losartan 50 mg half tablet daily. He has been monitoring his BPs at home and has been in the 170/115.  When today for an acupuncture treatment, upon arrival to that office his blood pressure was 190/120.  Recheck: 145/117. Pulse is typically in the 70s.  He is calling because he is concerned about his blood pressure.   Review of Systems No new or different headaches, he does have a headache on and off. No severe chest pain, occasionally had a fleeting discomfort at the left side of the chest, last few seconds.  No ripping or severe chest pain. No shortness of breath No edema No visual disturbances at the present time.  Past Medical History:  Diagnosis Date  . Aneurysm ([facility]) 2020  . Anxiety    managed per pt  . Depression    "haven't really dealt with" in "quite some time"  . High blood pressure    slightly elevated    No past surgical history on file.  Social History    Socioeconomic History  . Marital status: Significant Other    Spouse name: Not on file  . Number of children: Not on file  . Years of education: Not on file  . Highest education level: Some college, no degree  Occupational History  . Not on file  Social Needs  . Financial resource strain: Not on file  . Food insecurity    Worry: Not on file    Inability: Not on file  . Transportation needs    Medical: Not on file    Non-medical: Not on file  Tobacco Use  . Smoking status: Current Every Day Smoker    Packs/day: 0.50    Types: Cigarettes  . Smokeless tobacco: Never Used  Substance and Sexual Activity  . Alcohol use: No    Comment: Stopped drinking a year and a half ago  . Drug use: No  . Sexual activity: Yes    Birth control/protection: Condom  Lifestyle  . Physical activity    Days per week: Not on file    Minutes per session: Not on file  . Stress: Not on file  Relationships  . Social Herbalist on phone: Not on file    Gets together: Not on file    Attends religious service: Not on file    Active member of club or organization: Not on file    Attends meetings of clubs or organizations: Not on file  Relationship status: Not on file  . Intimate partner violence    Fear of current or ex partner: Not on file    Emotionally abused: Not on file    Physically abused: Not on file    Forced sexual activity: Not on file  Other Topics Concern  . Not on file  Social History Narrative   Lives at home with his fiance Lexine Baton   Right handed   Caffeine: intermittently a red bull, not daily       Allergies as of 06/06/2019   No Known Allergies     Medication List       Accurate as of June 06, 2019  4:02 PM. If you have any questions, ask your nurse or doctor.        albuterol 108 (90 Base) MCG/ACT inhaler Commonly known as: VENTOLIN HFA Inhale 2 puffs into the lungs every 6 (six) hours as needed for wheezing or shortness of breath.   cyclobenzaprine 10  MG tablet Commonly known as: FLEXERIL Take 0.5-1 tablets (5-10 mg total) by mouth 3 (three) times daily as needed for muscle spasms.   divalproex 500 MG DR tablet Commonly known as: DEPAKOTE Take 1 tablet (500 mg total) by mouth daily.   ibuprofen 200 MG tablet Commonly known as: ADVIL Take 400 mg by mouth every 6 (six) hours as needed for moderate pain.   losartan 50 MG tablet Commonly known as: COZAAR Start with 25 mg daily- increase to 50 as directed by MD           Objective:   Physical Exam There were no vitals taken for this visit. This is a virtual video visit, alert oriented x3, no apparent distress, speech is clear, face seems symmetric.    Assessment    28 year old gentleman with history of HTN, not controlled.  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 BP has not been well controlled in the last few weeks, currently on losartan 50 mg half tablet daily, no new symptoms but needs very close follow-up. Plan: Take a total of 50 mg of losartan daily Start amlodipine 10 mg, prescription sent Come back to see PCP in 2 days to be sure BP is improving ER if he has any unusual or severe headache or visual disturbances. Patient verbalized understanding  Today, I spent more than 25 min with the patient: >50% of the time counseling regards the need to control hypertension, doing extensive chart review given recent problems with headache and   abnormal MRI of the brain.   I discussed the assessment and treatment plan with the patient. The patient was provided an opportunity to ask questions and all were answered. The patient agreed with the plan and demonstrated an understanding of the instructions.   The patient was advised to call back or seek an in-person evaluation if the symptoms worsen or if the condition fails to improve as anticipated.

## 2019-06-06 NOTE — Telephone Encounter (Addendum)
Pt. And fiance report he was at the acupuncturist office and BP was high - 190/120 and 180/115. Denies any symptoms. " I just feel anxious because it is high." Instructed pt. That if he develops symptoms, headache, blurred vision, chest pain, to go to ED. Warm transfer to [facility] in the practice for a visit.   Reason for Disposition . Systolic BP  >= 160 OR Diastolic >= 737  Answer Assessment - Initial Assessment Questions 1. BLOOD PRESSURE: "What is the blood pressure?" "Did you take at least two measurements 5 minutes apart?"     190/120  And  180/150 2. ONSET: "When did you take your blood pressure?"     This morning 3. HOW: "How did you obtain the blood pressure?" (e.g., visiting nurse, automatic home BP monitor)     Manuel cuff at acupuncture office 4. HISTORY: "Do you have a history of high blood pressure?"     Yes 5. MEDICATIONS: "Are you taking any medications for blood pressure?" "Have you missed any doses recently?"     No missed doses 6. OTHER SYMPTOMS: "Do you have any symptoms?" (e.g., headache, chest pain, blurred vision, difficulty breathing, weakness)     No 7. PREGNANCY: "Is there any chance you are pregnant?" "When was your last menstrual period?"     n/a  Protocols used: HIGH BLOOD PRESSURE-A-AH

## 2019-06-06 NOTE — Telephone Encounter (Signed)
Appt scheduled

## 2019-06-07 NOTE — Telephone Encounter (Signed)
Pts mother called in to check status of med change for pts migraines since he has stopped depakote. Pts has been diagnosed with a enlarged heart and is on amLODipine (NORVASC) 10 MG tablet and losartan (COZAAR) 50 MG tablet. She states he has very high blood pressure also. She is stating she feels as if no one is wanting to help and she is requesting a call back asap.

## 2019-06-07 NOTE — Telephone Encounter (Signed)
Ok to stop depakote and wait for side effects to stop. May use tylenol and ibuprofen as needed for headaches for now. Will ask dr Leonie Man to review next week for longer term mgmt of headaches.  Penni Bombard, MD 3/0/0923, 30:07 AM Certified in Neurology, Neurophysiology and Neuroimaging  [facility] [address], [place], [place] [address] [phone]

## 2019-06-07 NOTE — Telephone Encounter (Signed)
Called patient and apologized for delay in responding. I informed him Dr Leta Baptist stated it is fine for him to stop the depakote which he has already done and wait for the side effects to resolve. He may take tylenol and/or ibuprofen OTC for headaches. Dr Leonie Man will review next week and determine long term management of his headaches. I advised he call for any further questions or concerns. He verbalized understanding, appreciation.

## 2019-06-08 ENCOUNTER — Encounter: Payer: Self-pay | Admitting: Internal Medicine

## 2019-06-08 ENCOUNTER — Other Ambulatory Visit: Payer: Self-pay

## 2019-06-08 ENCOUNTER — Ambulatory Visit: Payer: BC Managed Care – PPO | Admitting: Internal Medicine

## 2019-06-08 VITALS — BP 135/97 | HR 83 | Temp 98.3°F | Resp 16 | Ht 70.0 in | Wt 178.1 lb

## 2019-06-08 DIAGNOSIS — F419 Anxiety disorder, unspecified: Secondary | ICD-10-CM | POA: Diagnosis not present

## 2019-06-08 DIAGNOSIS — I10 Essential (primary) hypertension: Secondary | ICD-10-CM

## 2019-06-08 MED ORDER — BUSPIRONE HCL 7.5 MG PO TABS: 7.5000 mg | ORAL_TABLET | Freq: Two times a day (BID) | ORAL | 0 refills | Status: DC

## 2019-06-08 NOTE — Patient Instructions (Addendum)
  GO TO THE FRONT DESK Schedule your next appointment   to see your primary doctor in 3 weeks  Continue the same blood pressure medications  For anxiety, start buspirone 1 tablet twice a day   Check the  blood pressure daily at different times BP GOAL is between 115/65 and  135/85. If it is consistently higher or lower, let me know   HOW TO TAKE YOUR BLOOD PRESSURE:   Rest 5 minutes before taking your blood pressure.   Don't smoke or drink caffeinated beverages for at least 30 minutes before.   Take your blood pressure before (not after) you eat.   Sit comfortably with your back supported and both feet on the floor (don't cross your legs).   Elevate your arm to heart level on a table or a desk.   Use the proper sized cuff. It should fit smoothly and snugly around your bare upper arm. There should be enough room to slip a fingertip under the cuff. The bottom edge of the cuff should be 1 inch above the crease of the elbow.   Ideally, take 3 measurements at one sitting and record the average.

## 2019-06-08 NOTE — Progress Notes (Signed)
Pre visit review using our clinic review tool, if applicable. No additional management support is needed unless otherwise documented below in the visit note. 

## 2019-06-08 NOTE — Progress Notes (Signed)
Subjective:    Patient ID: Stephen Castro, male    DOB: [birth year], 28 y.o.   MRN: [id number]  DOS:  06/08/2019 Type of visit - description: Follow-up The patient recently seen with hypertensive urgency, losartan was increased and amlodipine added. Good compliance, ambulatory BPs much improved.  I noticed some shakiness, ask him about anxiety and he admits to anxiety due to his recent medical problems.  He also likes to see Dr. Quay Burow.  Review of Systems  No lower extremity edema Denies alcohol, the use of recreational drugs. Has a fleeting, left-sided chest pain the last few seconds, not a new symptom. Has a mild headache, at baseline.  Past Medical History:  Diagnosis Date   Aneurysm ([place]) 2020   Anxiety    managed per pt   Depression    "haven't really dealt with" in "quite some time"   High blood pressure    slightly elevated    No past surgical history on file.  Social History   Socioeconomic History   Marital status: Significant Other    Spouse name: Not on file   Number of children: Not on file   Years of education: Not on file   Highest education level: Some college, no degree  Occupational History   Not on file  Social Needs   Financial resource strain: Not on file   Food insecurity    Worry: Not on file    Inability: Not on file   Transportation needs    Medical: Not on file    Non-medical: Not on file  Tobacco Use   Smoking status: Current Every Day Smoker    Packs/day: 0.50    Types: Cigarettes   Smokeless tobacco: Never Used  Substance and Sexual Activity   Alcohol use: No    Comment: Stopped drinking a year and a half ago   Drug use: No   Sexual activity: Yes    Birth control/protection: Condom  Lifestyle   Physical activity    Days per week: Not on file    Minutes per session: Not on file   Stress: Not on file  Relationships   Social connections    Talks on phone: Not on file    Gets together: Not on  file    Attends religious service: Not on file    Active member of club or organization: Not on file    Attends meetings of clubs or organizations: Not on file    Relationship status: Not on file   Intimate partner violence    Fear of current or ex partner: Not on file    Emotionally abused: Not on file    Physically abused: Not on file    Forced sexual activity: Not on file  Other Topics Concern   Not on file  Social History Narrative   Lives at home with his fiance Lexine Baton   Right handed   Caffeine: intermittently a red bull, not daily       Allergies as of 06/08/2019   No Known Allergies     Medication List       Accurate as of June 08, 2019  9:23 PM. If you have any questions, ask your nurse or doctor.        albuterol 108 (90 Base) MCG/ACT inhaler Commonly known as: VENTOLIN HFA Inhale 2 puffs into the lungs every 6 (six) hours as needed for wheezing or shortness of breath.   amLODipine 10 MG tablet Commonly known as: NORVASC  Take 1 tablet (10 mg total) by mouth daily.   busPIRone 7.5 MG tablet Commonly known as: BUSPAR Take 1 tablet (7.5 mg total) by mouth 2 (two) times daily. Started by: Kathlene November, MD   cyclobenzaprine 10 MG tablet Commonly known as: FLEXERIL Take 0.5-1 tablets (5-10 mg total) by mouth 3 (three) times daily as needed for muscle spasms.   divalproex 500 MG DR tablet Commonly known as: DEPAKOTE Take 1 tablet (500 mg total) by mouth daily.   ibuprofen 200 MG tablet Commonly known as: ADVIL Take 400 mg by mouth every 6 (six) hours as needed for moderate pain.   losartan 50 MG tablet Commonly known as: COZAAR Take 1 tablet (50 mg total) by mouth daily. What changed:   how much to take  how to take this  when to take this  additional instructions Changed by: Kathlene November, MD           Objective:   Physical Exam BP (!) 135/97 (BP Location: Left Arm, Patient Position: Sitting, Cuff Size: Small)    Pulse 83    Temp 98.3 F (36.8 C)  (Oral)    Resp 16    Ht 5\' 10"  (1.778 m)    Wt 178 lb 2 oz (80.8 kg)    SpO2 99%    BMI 25.56 kg/m  General:   Well developed, NAD, BMI noted.  HEENT:  Normocephalic . Face symmetric, atraumatic Lungs:  CTA B Normal respiratory effort, no intercostal retractions, no accessory muscle use. Heart: RRR,  no murmur.  no pretibial edema bilaterally  Abdomen:  Not distended, soft, non-tender. No rebound or rigidity.  No bruit. Skin: Not pale. Not jaundice Neurologic:  alert & oriented X3.  Speech normal, gait appropriate for age and unassisted.  Mild hand shakiness. Psych--  Cognition and judgment appear intact.  Cooperative with normal attention span and concentration.  Behavior appropriate. Mildly anxious but not  depressed appearing.     Assessment      28 year old gentleman with history of HTN, not controlled.  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 See visit from 2 days ago, BP was extremely elevated, losartan increased to 50 mg and started amlodipine 10 mg. Ambulatory BPs are decreasing: 151/110, 149/104, last night 128/90. Today 135/97. Recommend to continue losartan and amlodipine at present doses but if blood pressures start dropping below 115 he will let us know, most likely will cut amlodipine to 5 mg. We will refer patient to Dr. Quay Burow as pt is  very young:  rule out secondary hypertension. Follow-up with PCP in 3 weeks Headaches, chest pain: Symptoms are at baseline and not worse.  Recommend ER if he has unusual or severe symptoms Anxiety: Triggered by his recent medical problems, handshaking is noted, last TSH normal, denies illicit drugs/etoh. We have a discussion about options, he does not like SSRIs due to side effects, he had a hard time getting of Xanax so will avoid benzos, eventually we agreed on BuSpar 7.5 mg twice a day.  Reassess by PCP RTC 3 weeks PCP

## 2019-06-12 ENCOUNTER — Other Ambulatory Visit (HOSPITAL_COMMUNITY): Payer: Self-pay | Admitting: Neurology

## 2019-06-12 ENCOUNTER — Telehealth: Payer: Self-pay | Admitting: Neurology

## 2019-06-12 MED ORDER — TOPIRAMATE 25 MG PO TABS: 25.0000 mg | ORAL_TABLET | Freq: Two times a day (BID) | ORAL | 2 refills | Status: DC

## 2019-06-12 NOTE — Telephone Encounter (Signed)
Minna Antis, RN     10:42 AM Note   Called patient and apologized for delay in responding. I informed him Dr Leta Baptist stated it is fine for him to stop the depakote which he has already done and wait for the side effects to resolve. He may take tylenol and/or ibuprofen OTC for headaches. Dr Leonie Man will review next week and determine long term management of his headaches. I advised he call for any further questions or concerns. He verbalized understanding, appreciation.

## 2019-06-12 NOTE — Telephone Encounter (Signed)
Pt would like to speak to the RN or provider about him having to discontinue the divalproex (DEPAKOTE) 500 MG DR tablet. Please advise.

## 2019-06-12 NOTE — Telephone Encounter (Signed)
I spoke to the patient who informed me that Depakote made him sick to the stomach and he felt unwell and hence stopped it.  He is feeling better soon after stopping it.  He still has headaches which are bothersome.  I recommend he try Topamax instead.  Begin 25 mg twice daily for a week increase if tolerated and needed to 50 mg twice daily.  I mentioned side effects with the patient advising to call me if needed.  He voiced understanding.

## 2019-06-13 NOTE — Telephone Encounter (Signed)
Garvin Fila, MD to Me . Copland, Gay Filler, MD     5:41 PM Note   I spoke to the patient who informed me that Depakote made him sick to the stomach and he felt unwell and hence stopped it.  He is feeling better soon after stopping it.  He still has headaches which are bothersome.  I recommend he try Topamax instead.  Begin 25 mg twice daily for a week increase if tolerated and needed to 50 mg twice daily.  I mentioned side effects with the patient advising to call me if needed.  He voiced understanding.

## 2019-06-16 ENCOUNTER — Other Ambulatory Visit: Payer: Self-pay

## 2019-06-16 ENCOUNTER — Ambulatory Visit: Payer: Self-pay | Admitting: *Deleted

## 2019-06-16 NOTE — Telephone Encounter (Signed)
Pt called with having an elevated b/p pf 167/120 and 165/122. Hr between 87-75. He also stating that he is having shortness of breath, headache, weakness, and blurred vision. He has been on losartan 50 mg and amlodipine 10 mg which he takes one of each dally.  Has not missed any doses. Advised that he go to the ED. He stated that they will not do anything for him and wants to have his pcp give him a call to see if his cardiac medications could be increased. Advised that he is having cardiac symptoms and he should go to ED or call 911. He stated his girlfriend could take him but wanted to speak with a provider first. Notified and routing triage to [facility] at [facility].   Reason for Disposition . [1] Systolic BP  >= 610 OR Diastolic >= 960 AND [4] cardiac or neurologic symptoms (e.g., chest pain, difficulty breathing, unsteady gait, blurred vision)  Answer Assessment - Initial Assessment Questions 1. BLOOD PRESSURE: "What is the blood pressure?" "Did you take at least two measurements 5 minutes apart?"     167/120 HR 87 earlier today and now 165/122 HR 75 2. ONSET: "When did you take your blood pressure?"     Earlier today and now 3. HOW: "How did you obtain the blood pressure?" (e.g., visiting nurse, automatic home BP monitor)     automatic home BP monitor 4. HISTORY: "Do you have a history of high blood pressure?"     yes 5. MEDICATIONS: "Are you taking any medications for blood pressure?" "Have you missed any doses recently?"     Yes and not missed any doses 6. OTHER SYMPTOMS: "Do you have any symptoms?" (e.g., headache, chest pain, blurred vision, difficulty breathing, weakness)     Headache, blurred vision, random chest, shortness and weakness 7. PREGNANCY: "Is there any chance you are pregnant?" "When was your last menstrual period?"     n/a  Protocols used: HIGH BLOOD PRESSURE-A-AH

## 2019-06-16 NOTE — Telephone Encounter (Signed)
Called patient, explained to him with his blood pressure being that high while taking medication along with his symptoms of SOB, weakness and blurred vision, he will need to be seen in the ER. He stated "ive already had an ekg, echo, blood tests what else is there to do" explained to him the risk of stroke or MI with bp being that high and he said "ok" he then asked if I could schedule him to see Dr. Lorelei Pont next week in office. Scheduled follow up of ER next week.

## 2019-06-17 DIAGNOSIS — I671 Cerebral aneurysm, nonruptured: Secondary | ICD-10-CM | POA: Insufficient documentation

## 2019-06-17 NOTE — Progress Notes (Signed)
[facility] [address] [place], [place] [address] [phone]  Date:  06/19/2019   Name:  Stephen Castro   DOB:  10-06-1991   MRN:  [id number]  PCP:  Darreld Mclean, MD    Chief Complaint: Hypertension (156/101 , 160-150/100s)   History of Present Illness:  Stephen Castro is a 28 y.o. very pleasant male patient who presents with the following:  Here today for a BP follow-up- I last saw him on 7/27- per my notes from that visit:   Following up today- recently pt was seen in the ER with headaches, which led to dx of small cerebral aneurysm.  This is being evaluated by neurology and NSG.  He has also been noted to have elevated BP, both at MD office and at home.  Etiology of HTN is not clear yet, but will go ahead and start him on a moderate dose ARB Labs pending as above EKG is slightly abnl- will obtain echo for more information   I started him on losartan 50.  Echo was ok He then had a virtual visit with Dr Larose Kells on 8/4- added amlodipine 10 Dr Larose Kells saw him back on 8/6- BP better, planned to have him see Dr. Gwenlyn Found for eval of his unexpected HTN.  He has an appt at the end of this month. Also stared on buspar 7.5 BID for anxiety - however he did not start it as of yet  Also, his Depakote was stopped and changed to topamax per Dr. Leonie Man on 8/11 due to SE -however he has not started on this either.   He notes that "whatever the medication is supposed to treat I end up acting like I have it, Depakote was making me feel like I was bipolar" He is hesitant to start on topamax due to potential SE  He called in on 8/14 with concern of very high BP and SOB, HA, blurred vision.  He was advised to go to the ER but did not do so.  Scheduled this appt to see me instead  He is doing accupuncture for his headaches.  So far it is not really helping He notes pain in his left flank this am since he woke up   BP Readings from Last 3  Encounters:  06/19/19 (!) 158/96  06/08/19 (!) 135/97  05/29/19 (!) 158/100   He is checking his BP a couple of times a day- he brings in a list of his home BP readings  His home BP readings are 140s- 160s/100-120  He notes that his HA are not as frequent, but still severe when they do occur Right now he is just taking amlodipine 10 and losartan 50 for his BP   He does smoke but is trying to cut down He does not drink much caffeine Denies any other drugs or supplements which could contribute to HTN He does notice that he will flush sometimes   Patient Active Problem List   Diagnosis Date Noted  . Brain aneurysm 06/17/2019  . Gastroenteritis 04/13/2019  . Shoulder injury, left, initial encounter 01/24/2018  . ADHD (attention deficit hyperactivity disorder) 04/04/2013  . Anxiety state, unspecified 04/04/2013    Past Medical History:  Diagnosis Date  . Aneurysm ([place]) 2020  . Anxiety    managed per pt  . Depression    "haven't really dealt with" in "quite some time"  . High blood pressure  slightly elevated    History reviewed. No pertinent surgical history.  Social History   Tobacco Use  . Smoking status: Current Every Day Smoker    Packs/day: 0.50    Types: Cigarettes  . Smokeless tobacco: Never Used  Substance Use Topics  . Alcohol use: No    Comment: Stopped drinking a year and a half ago  . Drug use: No    Family History  Problem Relation Age of Onset  . Stroke Mother        carotid artery dissection during massage   . Aneurysm Paternal Grandmother   . Hypertension Other        both sides of family  . Lung cancer Other        both sides of family   . Cerebral aneurysm Maternal Uncle     No Known Allergies  Medication list has been reviewed and updated.  Current Outpatient Medications on File Prior to Visit  Medication Sig Dispense Refill  . albuterol (PROVENTIL HFA;VENTOLIN HFA) 108 (90 Base) MCG/ACT inhaler Inhale 2 puffs into the lungs every  6 (six) hours as needed for wheezing or shortness of breath. 1 Inhaler 0  . amLODipine (NORVASC) 10 MG tablet Take 1 tablet (10 mg total) by mouth daily. 30 tablet 1  . busPIRone (BUSPAR) 7.5 MG tablet Take 1 tablet (7.5 mg total) by mouth 2 (two) times daily. 60 tablet 0  . cyclobenzaprine (FLEXERIL) 10 MG tablet Take 0.5-1 tablets (5-10 mg total) by mouth 3 (three) times daily as needed for muscle spasms. 21 tablet 0  . ibuprofen (ADVIL) 200 MG tablet Take 400 mg by mouth every 6 (six) hours as needed for moderate pain.    Marland Kitchen losartan (COZAAR) 50 MG tablet Take 1 tablet (50 mg total) by mouth daily.    Marland Kitchen topiramate (TOPAMAX) 25 MG tablet Take 1 tablet (25 mg total) by mouth 2 (two) times daily. Start 1 tablet twice daily x 1 week and then increase to 2 tablets twice daily if tolerated and needed 60 tablet 2   No current facility-administered medications on file prior to visit.     Review of Systems:  As per HPI- otherwise negative. No fever or chills Notes that he has gained weight  Wt Readings from Last 3 Encounters:  06/19/19 181 lb (82.1 kg)  06/08/19 178 lb 2 oz (80.8 kg)  05/29/19 175 lb (79.4 kg)     Physical Examination: Vitals:   06/19/19 1038  BP: (!) 158/96  Pulse: 92  Resp: 16  Temp: 97.9 F (36.6 C)  SpO2: 98%   Vitals:   06/19/19 1038  Weight: 181 lb (82.1 kg)  Height: 5\' 10"  (1.778 m)   Body mass index is 25.97 kg/m. Ideal Body Weight: Weight in (lb) to have BMI = 25: 173.9  GEN: WDWN, NAD, Non-toxic, A & O x 3, normal weight, looks well  HEENT: Atraumatic, Normocephalic. Neck supple. No masses, No LAD. Ears and Nose: No external deformity. CV: RRR, No M/G/R. No JVD. No thrill. No extra heart sounds. PULM: CTA B, no wheezes, crackles, rhonchi. No retractions. No resp. distress. No accessory muscle use. ABD: S, NT, ND, +BS EXTR: No c/c/e NEURO Normal gait.  PSYCH: Normally interactive. Conversant. Not depressed or anxious appearing.  Calm demeanor.   He has reproducible tenderness in the left Paraspinous muscles Negative CVA percussion   Assessment and Plan:   ICD-10-CM   1. Brain aneurysm  I67.1   2. Hypertension, unspecified type  I10 US Renal Artery Stenosis   Stephen Castro continues to have difficult to control BPs Still high on losartan 50 and amlodipine 10- increase losartan to 75 mg  He is not taking buspar or topamax Will set up renal artery [place] He is seeing cardiology at the end of the month Gave pt note for Dr. Gwenlyn Found regarding some labs I would like- pt does not wish to have blood draw today  Asked him to continue to keep me posted regarding his BP and how he is feeling  He is still uncertain about what is doing on with his aneurysm vs infundibulum of the right ICA- MRI also was not able to give a definite answer.    He is also frustrated by his HA.  Offered to set up a 2nd opinion with neurology but he declines for now  MRI 05/07/2019 IMPRESSION: 1. 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 But if a tiny aneurysm, since this should be extradural, rupture would result not in subarachnoid hemorrhage but in carotid-cavernous fistula. Given the patient's young age Endovascular Neurosurgery or Neuro-Interventional Radiology consultation is suggested to evaluate the appropriateness of potential treatment. 2. Otherwise normal intracranial MRA.   Follow-up: No follow-ups on file.  No orders of the defined types were placed in this encounter.  Orders Placed This Encounter  Procedures  . US Renal Artery Stenosis    @SIGN @    Signed Lamar Blinks, MD

## 2019-06-19 ENCOUNTER — Ambulatory Visit: Payer: BC Managed Care – PPO | Admitting: Family Medicine

## 2019-06-19 ENCOUNTER — Encounter: Payer: Self-pay | Admitting: Family Medicine

## 2019-06-19 ENCOUNTER — Other Ambulatory Visit: Payer: Self-pay

## 2019-06-19 VITALS — BP 158/96 | HR 92 | Temp 97.9°F | Resp 16 | Ht 70.0 in | Wt 181.0 lb

## 2019-06-19 DIAGNOSIS — I671 Cerebral aneurysm, nonruptured: Secondary | ICD-10-CM

## 2019-06-19 DIAGNOSIS — I10 Essential (primary) hypertension: Secondary | ICD-10-CM

## 2019-06-19 NOTE — Patient Instructions (Signed)
Good to see you again today I am sorry that you continue to have such a hard time Please continue amlodipine 10 Please increase your losartan to 75 mg (1.5 tablets) I gave you a note with some labs that I would like- please share with Dr Gwenlyn Found We will get you set up for a renal artery ultrasound asap

## 2019-06-19 NOTE — Telephone Encounter (Signed)
Patient did not go to ED as requested

## 2019-06-22 DIAGNOSIS — M542 Cervicalgia: Secondary | ICD-10-CM | POA: Diagnosis not present

## 2019-06-22 DIAGNOSIS — M545 Low back pain: Secondary | ICD-10-CM | POA: Diagnosis not present

## 2019-06-22 DIAGNOSIS — G44201 Tension-type headache, unspecified, intractable: Secondary | ICD-10-CM | POA: Diagnosis not present

## 2019-06-23 ENCOUNTER — Telehealth: Payer: Self-pay

## 2019-06-23 NOTE — Telephone Encounter (Signed)
Copied from [place] [id number]. Topic: Referral - Status >> Jun 21, 2019 12:02 PM Yvette Rack wrote: Reason for CRM: Pt stated he has yet to hear from [facility] and he was told if he had not heard anything in a few days to call back to advise. Pt requests call back

## 2019-06-26 DIAGNOSIS — M9903 Segmental and somatic dysfunction of lumbar region: Secondary | ICD-10-CM | POA: Diagnosis not present

## 2019-06-26 DIAGNOSIS — M545 Low back pain: Secondary | ICD-10-CM | POA: Diagnosis not present

## 2019-06-26 DIAGNOSIS — M542 Cervicalgia: Secondary | ICD-10-CM | POA: Diagnosis not present

## 2019-06-26 DIAGNOSIS — G44201 Tension-type headache, unspecified, intractable: Secondary | ICD-10-CM | POA: Diagnosis not present

## 2019-06-27 ENCOUNTER — Inpatient Hospital Stay: Admission: RE | Admit: 2019-06-27 | Payer: BC Managed Care – PPO | Source: Ambulatory Visit

## 2019-06-28 ENCOUNTER — Telehealth: Payer: Self-pay | Admitting: Family Medicine

## 2019-06-28 NOTE — Telephone Encounter (Signed)
Lat night he had a strange episode where he felt dizzy and really weird- he checked his BP and it was  101/54- 10 minutes later it was back to his more typical range Today is BP was 156/ 114 this am,  This is typical for him as of late He declines the ER Seeing cardiology in 2 days Made him an appt to see me tomorrow so we can check on how he is going

## 2019-06-29 ENCOUNTER — Ambulatory Visit: Payer: BC Managed Care – PPO | Admitting: Family Medicine

## 2019-06-29 ENCOUNTER — Encounter: Payer: Self-pay | Admitting: Family Medicine

## 2019-06-29 ENCOUNTER — Other Ambulatory Visit: Payer: Self-pay

## 2019-06-29 VITALS — BP 128/90 | HR 95 | Temp 98.0°F | Resp 16 | Ht 70.0 in | Wt 180.0 lb

## 2019-06-29 DIAGNOSIS — M25532 Pain in left wrist: Secondary | ICD-10-CM | POA: Diagnosis not present

## 2019-06-29 DIAGNOSIS — I10 Essential (primary) hypertension: Secondary | ICD-10-CM | POA: Diagnosis not present

## 2019-06-29 DIAGNOSIS — M25531 Pain in right wrist: Secondary | ICD-10-CM | POA: Diagnosis not present

## 2019-06-29 DIAGNOSIS — I671 Cerebral aneurysm, nonruptured: Secondary | ICD-10-CM | POA: Diagnosis not present

## 2019-06-29 DIAGNOSIS — R0602 Shortness of breath: Secondary | ICD-10-CM | POA: Diagnosis not present

## 2019-06-29 LAB — SEDIMENTATION RATE: Sed Rate: 2 mm/hr (ref 0–15)

## 2019-06-29 LAB — C-REACTIVE PROTEIN: CRP: <1 mg/dL (ref 0.5–20.0)

## 2019-06-29 NOTE — Progress Notes (Addendum)
[place] at [facility] [address], [place], [place] [address] [phone]  Date:  06/29/2019   Name:  Stephen Castro   DOB:  Dec 28, 1990   MRN:  [id number]  PCP:  Darreld Mclean, MD    Chief Complaint: Hypertension (160/120s dipped down to 101/54, cold sweats, ringing in ear)   History of Present Illness:  Stephen Castro is a 28 y.o. very pleasant male patient who presents with the following:  Pt who was dx with a brain aneurysm on recent MRI 05/07/19 IMPRESSION: 1. 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 But if a tiny aneurysm, since this should be extradural, rupture would result not in subarachnoid hemorrhage but in carotid-cavernous fistula. Given the patient's young age Endovascular Neurosurgery or Neuro-Interventional Radiology consultation is suggested to evaluate the appropriateness of potential treatment. 2. Otherwise normal intracranial MRA.  We have also been dealing with new onset of uncontrolled HTN He is seeing cardiology tomorrow We had done an echo and I ordered renal artery [place] - not done yet, scheduled for next week   I last saw him in the office on 8/17 - at that time he was taking amlodipine 10 and losartan 50 mg- his BP was still high and I asked him to increase his losartan to 75 mg Yesterday we spoke on the phone- he had an episode a couple of days ago where he got up in the night, did not feel well and checked his BP finding it to be low, 101/54.  Hypotension resolved in a few minutes.  This has not happened again since then He continues to monitor his BP He notes that he feels about the same--except today mentions that he feels winded more easily than is normal for him Has noted this for a week or so  Not having chest pain however  He does not notice any diarrhea, but he has tended towards gas and IBG  type symptoms for a long time He notes a mild headache   He did check his BP a couple of times since we talked yesterday- mid 140s/90s No fever  He also mentions some joint pains- more in his wrists and ankles- that come and go.  No swelling. He is not sure if this was significant     BP Readings from Last 3 Encounters:  06/29/19 128/90  06/19/19 (!) 158/96  06/08/19 (!) 135/97    Patient Active Problem List   Diagnosis Date Noted  . Brain aneurysm 06/17/2019  . Gastroenteritis 04/13/2019  . Shoulder injury, left, initial encounter 01/24/2018  . ADHD (attention deficit hyperactivity disorder) 04/04/2013  . Anxiety state, unspecified 04/04/2013    Past Medical History:  Diagnosis Date  . Aneurysm ([place]) 2020  . Anxiety    managed per pt  . Depression    "haven't really dealt with" in "quite some time"  . High blood pressure    slightly elevated    History reviewed. No pertinent surgical history.  Social History   Tobacco Use  . Smoking status: Current Every Day Smoker    Packs/day: 0.50    Types: Cigarettes  . Smokeless tobacco: Never Used  Substance Use Topics  . Alcohol use: No    Comment: Stopped drinking a year and a half ago  . Drug use: No    Family History  Problem Relation Age  of Onset  . Stroke Mother        carotid artery dissection during massage   . Aneurysm Paternal Grandmother   . Hypertension Other        both sides of family  . Lung cancer Other        both sides of family   . Cerebral aneurysm Maternal Uncle     No Known Allergies  Medication list has been reviewed and updated.  Current Outpatient Medications on File Prior to Visit  Medication Sig Dispense Refill  . albuterol (PROVENTIL HFA;VENTOLIN HFA) 108 (90 Base) MCG/ACT inhaler Inhale 2 puffs into the lungs every 6 (six) hours as needed for wheezing or shortness of breath. 1 Inhaler 0  . amLODipine (NORVASC) 10 MG tablet Take 1 tablet (10 mg total) by mouth daily. 30 tablet 1   . busPIRone (BUSPAR) 7.5 MG tablet Take 1 tablet (7.5 mg total) by mouth 2 (two) times daily. 60 tablet 0  . cyclobenzaprine (FLEXERIL) 10 MG tablet Take 0.5-1 tablets (5-10 mg total) by mouth 3 (three) times daily as needed for muscle spasms. 21 tablet 0  . ibuprofen (ADVIL) 200 MG tablet Take 400 mg by mouth every 6 (six) hours as needed for moderate pain.    Marland Kitchen losartan (COZAAR) 50 MG tablet Take 1 tablet (50 mg total) by mouth daily.    Marland Kitchen topiramate (TOPAMAX) 25 MG tablet Take 1 tablet (25 mg total) by mouth 2 (two) times daily. Start 1 tablet twice daily x 1 week and then increase to 2 tablets twice daily if tolerated and needed (Patient not taking: Reported on 06/29/2019) 60 tablet 2   No current facility-administered medications on file prior to visit.     Review of Systems:  As per HPI- otherwise negative.   Physical Examination: Vitals:   06/29/19 1304  BP: 128/90  Pulse: 95  Resp: 16  Temp: 98 F (36.7 C)  SpO2: 97%   Vitals:   06/29/19 1304  Weight: 180 lb (81.6 kg)  Height: 5\' 10"  (1.778 m)   Body mass index is 25.83 kg/m. Ideal Body Weight: Weight in (lb) to have BMI = 25: 173.9  GEN: WDWN, NAD, Non-toxic, A & O x 3, looks well, normal weight  HEENT: Atraumatic, Normocephalic. Neck supple. No masses, No LAD. Ears and Nose: No external deformity. CV: RRR, No M/G/R. No JVD. No thrill. No extra heart sounds. PULM: CTA B, no wheezes, crackles, rhonchi. No retractions. No resp. distress. No accessory muscle use. ABD: S, NT, ND. No rebound. No HSM. EXTR: No c/c/e NEURO Normal gait.  PSYCH: Normally interactive. Conversant. Not depressed or anxious appearing.  Calm demeanor.    Assessment and Plan:   ICD-10-CM   1. Hypertension, unspecified type  I10   2. Brain aneurysm  I67.1   3. SOB (shortness of breath)  R06.02 D-Dimer, Quantitative    DG Chest 2 View  4. Pain of both wrist joints  M25.531 Sedimentation rate   M25.532 C-reactive protein    ANA     Rheumatoid Factor   BP under reasonable control today Seeing cardiology tomorrow SOB- check D dimer. Pt aware that if positive he will need CT angio Joint pains- consider Autoimmune issue.  Basic labs today   Follow-up: No follow-ups on file.  No orders of the defined types were placed in this encounter.  Orders Placed This Encounter  Procedures  . DG Chest 2 View  . D-Dimer, Quantitative  . Sedimentation rate  .  C-reactive protein  . ANA  . Rheumatoid Factor    @SIGN @    Signed Lamar Blinks, MD  Received his D dimer- negative, good news Results for orders placed or performed in visit on 06/29/19  D-Dimer, Quantitative  Result Value Ref Range   D-Dimer, Quant <0.19 <0.50 mcg/mL FEU   Called pt- D dimer is negative.  He has not had any chest imaging as of yet.  No CT needed, but will order a plain chest film at [facility] imaging.  Can walk in and do today     Results for orders placed or performed in visit on 06/29/19  D-Dimer, Quantitative  Result Value Ref Range   D-Dimer, Quant <0.19 <0.50 mcg/mL FEU  Sedimentation rate  Result Value Ref Range   Sed Rate 2 0 - 15 mm/hr  C-reactive protein  Result Value Ref Range   CRP <1.0 0.5 - 20.0 mg/dL

## 2019-06-29 NOTE — Patient Instructions (Signed)
Great to see you today- I am glad that your BP is ok right now at least Will look forward to seeing what the cardiologist has to say tomorrow!    For the time being we will do labs to look for any autoimmune disorder Will also do a D dimer to eval possibility of a lung blood clot.  If positive we will need to consider a CT of your lungs- will hope for a negative   Take care

## 2019-06-30 ENCOUNTER — Ambulatory Visit: Payer: BC Managed Care – PPO | Admitting: Cardiovascular Disease

## 2019-06-30 ENCOUNTER — Other Ambulatory Visit: Payer: Self-pay

## 2019-06-30 ENCOUNTER — Encounter: Payer: Self-pay | Admitting: Cardiovascular Disease

## 2019-06-30 ENCOUNTER — Other Ambulatory Visit: Payer: BC Managed Care – PPO

## 2019-06-30 DIAGNOSIS — I158 Other secondary hypertension: Secondary | ICD-10-CM

## 2019-06-30 DIAGNOSIS — I10 Essential (primary) hypertension: Secondary | ICD-10-CM | POA: Insufficient documentation

## 2019-06-30 LAB — D-DIMER, QUANTITATIVE: D-Dimer, Quant: <0.19 mcg/mL FEU (ref <0.50)

## 2019-06-30 LAB — ANA: Anti Nuclear Antibody (ANA): NEGATIVE

## 2019-06-30 LAB — RHEUMATOID FACTOR: Rheumatoid fact SerPl-aCnc: <14 IU/mL (ref <14)

## 2019-06-30 NOTE — Patient Instructions (Signed)
Medication Instructions:  Your physician recommends that you continue on your current medications as directed. Please refer to the Current Medication list given to you today. If you need a refill on your cardiac medications before your next appointment, please call your pharmacy.   Lab work: NONE If you have labs (blood work) drawn today and your tests are completely normal, you will receive your results only by: Marland Kitchen MyChart Message (if you have MyChart) OR . A paper copy in the mail If you have any lab test that is abnormal or we need to change your treatment, we will call you to review the results.  Testing/Procedures: NONE  Follow-Up: At [facility], you and your health needs are our priority.  As part of our continuing mission to provide you with exceptional heart care, we have created designated Provider Care Teams.  These Care Teams include your primary Cardiologist (physician) and Advanced Practice Providers (APPs -  Physician Assistants and Nurse Practitioners) who all work together to provide you with the care you need, when you need it. . You will need a follow up appointment in 6-8 weeks with Dr. Quay Burow.     Any Other Special Instructions Will Be Listed Below (If Applicable). KEEP A BLOOD PRESSURE LOG FOR 30 DAYS THEN FOLLOW UP WITH A CLINICAL PHARMACIST IN THE HYPERTENSION CLINIC. YOU WILL NEED AN APPOINTMENT.

## 2019-06-30 NOTE — Progress Notes (Signed)
06/30/2019 Stephen Castro   13-Mar-1991  [id number]  Primary Physician Stephen Castro, Stephen Filler, MD Primary Cardiologist: Stephen Harp MD Stephen Castro, [place], [place]  HPI:  Stephen Castro is a 28 y.o. thin and fit appearing engaged Caucasian male with no children is accompanied by his fiancs Stephen Castro today.  He was referred by his PCP, Dr. Larose Castro, for eval evaluation of potential secondary causes of new onset hypertension.  He has no other risk factors.  He does drink several beers a night and does have a history of anxiety depression on SSRIs in the past.  There is a family history of cerebral aneurysms.  He has had new onset headaches and hypertension over the last several weeks to months.  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 Leonie Castro.  He was placed on amlodipine and losartan which has been titrated with improvement in his blood pressures and headaches.  Renal Dopplers are ordered.  A 2D echocardiogram performed 05/23/2019 showed normal LV size and function with mild concentric LVH.   Current Meds  Medication Sig  . albuterol (PROVENTIL HFA;VENTOLIN HFA) 108 (90 Base) MCG/ACT inhaler Inhale 2 puffs into the lungs every 6 (six) hours as needed for wheezing or shortness of breath.  Marland Kitchen amLODipine (NORVASC) 10 MG tablet Take 1 tablet (10 mg total) by mouth daily.  . cyclobenzaprine (FLEXERIL) 10 MG tablet Take 0.5-1 tablets (5-10 mg total) by mouth 3 (three) times daily as needed for muscle spasms.  Marland Kitchen ibuprofen (ADVIL) 200 MG tablet Take 400 mg by mouth every 6 (six) hours as needed for moderate pain.  Marland Kitchen losartan (COZAAR) 50 MG tablet Take 1 tablet (50 mg total) by mouth daily.     No Known Allergies  Social History   Socioeconomic History  . Marital status: Significant Other    Spouse name: Not on file  . Number of children: Not on file  . Years of education: Not on file  . Highest education level: Some college, no degree  Occupational History  . Not on  file  Social Needs  . Financial resource strain: Not on file  . Food insecurity    Worry: Not on file    Inability: Not on file  . Transportation needs    Medical: Not on file    Non-medical: Not on file  Tobacco Use  . Smoking status: Current Every Day Smoker    Packs/day: 0.50    Types: Cigarettes  . Smokeless tobacco: Never Used  Substance and Sexual Activity  . Alcohol use: No    Comment: Stopped drinking a year and a half ago  . Drug use: No  . Sexual activity: Yes    Birth control/protection: Condom  Lifestyle  . Physical activity    Days per week: Not on file    Minutes per session: Not on file  . Stress: Not on file  Relationships  . Social Herbalist on phone: Not on file    Gets together: Not on file    Attends religious service: Not on file    Active member of club or organization: Not on file    Attends meetings of clubs or organizations: Not on file    Relationship status: Not on file  . Intimate partner violence    Fear of current or ex partner: Not on file    Emotionally abused: Not on file    Physically abused: Not on file  Forced sexual activity: Not on file  Other Topics Concern  . Not on file  Social History Narrative   Lives at home with his fiance Stephen Castro   Right handed   Caffeine: intermittently a red bull, not daily      Review of Systems: General: negative for chills, fever, night sweats or weight changes.  Cardiovascular: negative for chest pain, dyspnea on exertion, edema, orthopnea, palpitations, paroxysmal nocturnal dyspnea or shortness of breath Dermatological: negative for rash Respiratory: negative for cough or wheezing Urologic: negative for hematuria Abdominal: negative for nausea, vomiting, diarrhea, bright red blood per rectum, melena, or hematemesis Neurologic: negative for visual changes, syncope, or dizziness All other systems reviewed and are otherwise negative except as noted above.    Blood pressure 120/90,  pulse 86, temperature 97.9 F (36.6 C), height 5\' 10"  (1.778 m), weight 179 lb (81.2 kg).  General appearance: alert and no distress Neck: no adenopathy, no carotid bruit, no JVD, supple, symmetrical, trachea midline and thyroid not enlarged, symmetric, no tenderness/mass/nodules Lungs: clear to auscultation bilaterally Heart: regular rate and rhythm, S1, S2 normal, no murmur, click, rub or gallop Extremities: extremities normal, atraumatic, no cyanosis or edema Pulses: 2+ and symmetric Skin: Skin color, texture, turgor normal. No rashes or lesions Neurologic: Alert and oriented X 3, normal strength and tone. Normal symmetric reflexes. Normal coordination and gait  EKG not performed today  ASSESSMENT AND PLAN:   Hypertension Mr. Castro was referred to me by Dr. Larose Castro for evaluation of secondary causes of hypertension.  He is a young healthy male with onset of headaches and hypertension recently.  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  He has had new onset headaches of unclear etiology thought to be potentially related to hypertension.  Is recently started on amlodipine and losartan which has been titrated.  He has renal Doppler scheduled.  I am going to have him keep a blood pressure log over the next 30 days and have Stephen Castro call him to review and titrate his medications.  I will will review his renal Doppler studies to rule out renal vascular retention, presumably FMD and an etiology.  If this is negative we may do a abdominal CT to look for adrenal adenomas.  Other options would be pheochromocytoma and 24-hour urine evaluation for metanephrines.      Stephen Harp MD FACP,FACC,FAHA, [facility] 06/30/2019 3:11 PM

## 2019-06-30 NOTE — Assessment & Plan Note (Signed)
Mr. Bialecki was referred to me by Dr. Larose Kells for evaluation of secondary causes of hypertension.  He is a young healthy male with onset of headaches and hypertension recently.  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  He has had new onset headaches of unclear etiology thought to be potentially related to hypertension.  Is recently started on amlodipine and losartan which has been titrated.  He has renal Doppler scheduled.  I am going to have him keep a blood pressure log over the next 30 days and have [name] call him to review and titrate his medications.  I will will review his renal Doppler studies to rule out renal vascular retention, presumably FMD and an etiology.  If this is negative we may do a abdominal CT to look for adrenal adenomas.  Other options would be pheochromocytoma and 24-hour urine evaluation for metanephrines.

## 2019-07-01 ENCOUNTER — Encounter: Payer: Self-pay | Admitting: Family Medicine

## 2019-07-05 ENCOUNTER — Encounter: Payer: Self-pay | Admitting: Family Medicine

## 2019-07-05 ENCOUNTER — Telehealth: Payer: Self-pay | Admitting: Neurology

## 2019-07-05 ENCOUNTER — Ambulatory Visit
Admission: RE | Admit: 2019-07-05 | Discharge: 2019-07-05 | Disposition: A | Discharge status: Home / Self Care | Payer: BC Managed Care – PPO | Source: Ambulatory Visit | Attending: Family Medicine | Admitting: Family Medicine

## 2019-07-05 DIAGNOSIS — I10 Essential (primary) hypertension: Secondary | ICD-10-CM

## 2019-07-05 NOTE — Telephone Encounter (Signed)
Pt is asking for an update on the referral to the headache specialist

## 2019-07-05 NOTE — Telephone Encounter (Signed)
I tried to call pt but it went to vm. I sent pt a mychart message about there is not mention of being referred to a headache specialist by Dr. Leonie Man. I would like for pt to contact us for more clarification.

## 2019-07-06 NOTE — Telephone Encounter (Signed)
Agree I can refer to Dr Jaynee Eagles if she is agreeable

## 2019-07-06 NOTE — Telephone Encounter (Signed)
I call pt about wanting to see a headache specialist. Pt stated it was discuss at the last visit with Dr. Leonie Man if the medication regimen did not work he would refer to headache specialist at [facility]. I stated message will be sent to Dr. Leonie Man. I explain to pt we are closed Friday and Monday. I stated a call we will be given to him next week of Dr Leonie Man recommendations. PT verbalized understanding.

## 2019-07-10 NOTE — Telephone Encounter (Signed)
That's fine, happy to see him thanks

## 2019-07-11 NOTE — Telephone Encounter (Signed)
Agree with plan 

## 2019-07-12 NOTE — Telephone Encounter (Signed)
Spoke with pt. Scheduled him for 08/08/2019 11:00 AM arrival 15-30 minutes prior. His questions were answered about office location. He was advised to bring a mask to appt. He was added to the wait list as well. He was very appreciative for the call.

## 2019-07-13 ENCOUNTER — Ambulatory Visit: Payer: Self-pay | Admitting: Neurology

## 2019-07-13 DIAGNOSIS — G44201 Tension-type headache, unspecified, intractable: Secondary | ICD-10-CM | POA: Diagnosis not present

## 2019-07-13 DIAGNOSIS — M542 Cervicalgia: Secondary | ICD-10-CM | POA: Diagnosis not present

## 2019-07-13 DIAGNOSIS — M9903 Segmental and somatic dysfunction of lumbar region: Secondary | ICD-10-CM | POA: Diagnosis not present

## 2019-07-13 DIAGNOSIS — M545 Low back pain: Secondary | ICD-10-CM | POA: Diagnosis not present

## 2019-07-20 DIAGNOSIS — M9903 Segmental and somatic dysfunction of lumbar region: Secondary | ICD-10-CM | POA: Diagnosis not present

## 2019-07-20 DIAGNOSIS — M542 Cervicalgia: Secondary | ICD-10-CM | POA: Diagnosis not present

## 2019-07-20 DIAGNOSIS — M545 Low back pain: Secondary | ICD-10-CM | POA: Diagnosis not present

## 2019-07-20 DIAGNOSIS — G44201 Tension-type headache, unspecified, intractable: Secondary | ICD-10-CM | POA: Diagnosis not present

## 2019-07-26 DIAGNOSIS — M9903 Segmental and somatic dysfunction of lumbar region: Secondary | ICD-10-CM | POA: Diagnosis not present

## 2019-07-26 DIAGNOSIS — M542 Cervicalgia: Secondary | ICD-10-CM | POA: Diagnosis not present

## 2019-07-26 DIAGNOSIS — G44201 Tension-type headache, unspecified, intractable: Secondary | ICD-10-CM | POA: Diagnosis not present

## 2019-07-26 DIAGNOSIS — M545 Low back pain: Secondary | ICD-10-CM | POA: Diagnosis not present

## 2019-08-02 DIAGNOSIS — M542 Cervicalgia: Secondary | ICD-10-CM | POA: Diagnosis not present

## 2019-08-02 DIAGNOSIS — M545 Low back pain: Secondary | ICD-10-CM | POA: Diagnosis not present

## 2019-08-02 DIAGNOSIS — M9903 Segmental and somatic dysfunction of lumbar region: Secondary | ICD-10-CM | POA: Diagnosis not present

## 2019-08-02 DIAGNOSIS — G44201 Tension-type headache, unspecified, intractable: Secondary | ICD-10-CM | POA: Diagnosis not present

## 2019-08-07 ENCOUNTER — Telehealth: Payer: Self-pay | Admitting: Cardiovascular Disease

## 2019-08-07 NOTE — Telephone Encounter (Signed)
Returned call to pt he states he is going out of town Wednesday and will not return until the 08-21-2019 and he needs to be seen before Dr Kennon Holter appointment 10-20 ok per [employer]

## 2019-08-07 NOTE — Telephone Encounter (Signed)
New message   Patient would like to know if he should see a pa before his appt with Dr. Gwenlyn Found for a f/u. Please advise.

## 2019-08-07 NOTE — Progress Notes (Signed)
[facility]    Provider:  Dr Jaynee Eagles Requesting Provider: Dr. Leonie Man, transfer of care Primary Care Provider:  Copland, Gay Filler, MD  CC:  Chronic headaches  HPI:  Stephen Castro is a 28 y.o. male here as requested for headache. PMHx depression, high blood pressure, anxiety, brain aneurysm, ADHD, migraines.  I reviewed Dr. Hurman Horn office consult note, patient has a remote history of migraines but about a month prior to office visit he noticed a strain in his neck when he was lifting something, his neck was sore for a few days and then as he started feeling improved he noticed intermittent headaches, the headache started the back of the neck usually and then radiate up to the vertex and occasionally to the front.  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  He also saw Dr. Kathyrn Sheriff endovascular neurosurgeon who recommended conservative follow-up of the infundibulum, aneurysm with surveillance MRIs at 1 to 2-year interval studies.  Neurologic exam was normal.  Headaches started 3 months ago, severe, tinnitus, start at the left on the back of the head and then roll through the head to the front brow, spreads to the right side. Worse with screen time. No light significant light sensitivity, if severe can have  noise sensitivity, severe, he lays on the couch, falling asleep helps, smells don't bother him, + nausea, movement makes it worse, like a hot poker, goes on for hours with shooting pain. Also has high blood pressure. Treating the headaches have helped. He was having them daily and now 3-4days a week for > 3 months. depakote causing side effects.  Meds tried; Depakote. Losartan(ARB),flexeril, norvasc(ca channel blocker),topamax  Review of Systems: Patient complains of symptoms per HPI as well as the following symptoms: headaches. Pertinent negatives and positives per HPI. All others negative.   Social History   Socioeconomic History  . Marital status: Significant Other    Spouse name: Not on file  . Number of children: Not on file  . Years of education: Not on file  . Highest education level: Some college, no degree  Occupational History  . Not on file  Social Needs  . Financial resource strain: Not on file  . Food insecurity    Worry: Not on file    Inability: Not on file  . Transportation needs    Medical: Not on file    Non-medical: Not on file  Tobacco Use  . Smoking status: Current Every Day Smoker    Packs/day: 0.50    Types: Cigarettes  . Smokeless tobacco: Never Used  . Tobacco comment: 08/08/2019 update, trying to cut back   Substance and Sexual Activity  . Alcohol use: Yes    Comment: Stopped drinking a year and a half ago. update 08/08/2019 drinks "a  little bit but not often"  . Drug use: No  . Sexual activity: Yes    Birth control/protection: Condom  Lifestyle  . Physical activity    Days per week: Not on file    Minutes per session: Not on file  . Stress: Not on file  Relationships  . Social Herbalist on phone: Not on file    Gets together: Not on file    Attends religious service: Not on file    Active member of club or organization: Not on file    Attends meetings of clubs or organizations: Not on file    Relationship status: Not on file  .  Intimate partner violence    Fear of current or ex partner: Not on file    Emotionally abused: Not on file    Physically abused: Not on file    Forced sexual activity: Not on file  Other Topics Concern  . Not on file  Social History Narrative   Lives at home with his fiance [name]   Right handed   Caffeine: rarely a red bull, not daily. Coffee some, not daily.    Family History  Problem Relation Age of Onset  . Stroke Mother        carotid artery dissection during massage   . Aneurysm Paternal Grandmother   . Hypertension Other        both sides of family  . Lung cancer Other        both sides of family   . Cerebral aneurysm Maternal Uncle   . Migraines Neg Hx        none that he is aware of    Past Medical History:  Diagnosis Date  . Aneurysm ([place]) 2020  . Anxiety    managed per pt  . Depression    "haven't really dealt with" in "quite some time"  . High blood pressure    slightly elevated    Patient Active Problem List   Diagnosis Date Noted  . Hypertension 06/30/2019  . Brain aneurysm 06/17/2019  . Gastroenteritis 04/13/2019  . Shoulder injury, left, initial encounter 01/24/2018  . ADHD (attention deficit hyperactivity disorder) 04/04/2013  . Anxiety state, unspecified 04/04/2013    History reviewed. No pertinent surgical history.  Current Outpatient Medications  Medication Sig Dispense Refill  . albuterol (PROVENTIL HFA;VENTOLIN HFA) 108 (90 Base) MCG/ACT inhaler Inhale 2 puffs into the lungs every 6 (six) hours as needed for wheezing or shortness of breath. 1 Inhaler 0  . amLODipine (NORVASC) 10 MG tablet Take 1 tablet (10 mg total) by mouth daily. 30 tablet 1  . cyclobenzaprine (FLEXERIL) 10 MG tablet Take 0.5-1 tablets (5-10 mg total) by mouth 3 (three) times daily as needed for muscle spasms. 60 tablet 3  . ibuprofen (ADVIL) 200 MG tablet Take 400 mg by mouth every 6 (six) hours as needed for moderate pain.    Marland Kitchen losartan (COZAAR) 50 MG tablet Take 1  tablet (50 mg total) by mouth daily.    . Fremanezumab-vfrm (AJOVY) 225 MG/1.5ML SOAJ Inject 225 mg into the skin every 30 (thirty) days. 1 pen 11  . ondansetron (ZOFRAN-ODT) 4 MG disintegrating tablet Take 1 tablet (4 mg total) by mouth every 8 (eight) hours as needed for nausea. 30 tablet 3   No current facility-administered medications for this visit.     Allergies as of 08/08/2019  . (No Known Allergies)    Vitals: BP 127/79 (BP Location: Right Arm, Patient  Position: Sitting)   Pulse 68   Temp 97.8 F (36.6 C) Comment: taken at front door, fiance 98  Ht 5\' 10"  (1.778 m)   Wt 182 lb (82.6 kg)   BMI 26.11 kg/m  Last Weight:  Wt Readings from Last 1 Encounters:  08/08/19 182 lb (82.6 kg)   Last Height:   Ht Readings from Last 1 Encounters:  08/08/19 5\' 10"  (1.778 m)     Physical exam: Exam: Gen: NAD, conversant, well nourised, obese, well groomed                     CV: RRR, no MRG. No Carotid Bruits. No peripheral edema, warm, nontender Eyes: Conjunctivae clear without exudates or hemorrhage  Neuro: Detailed Neurologic Exam  Speech:    Speech is normal; fluent and spontaneous with normal comprehension.  Cognition:    The patient is oriented to person, place, and time;     recent and remote memory intact;     language fluent;     normal attention, concentration,     fund of knowledge Cranial Nerves:    The pupils are equal, round, and reactive to light. The fundi are normal and spontaneous venous pulsations are present. Visual fields are full to finger confrontation. Extraocular movements are intact. Trigeminal sensation is intact and the muscles of mastication are normal. The face is symmetric. The palate elevates in the midline. Hearing intact. Voice is normal. Shoulder shrug is normal. The tongue has normal motion without fasciculations.   Coordination:    Normal finger to nose and heel to shin. Normal rapid alternating movements.   Gait:    Heel-toe and  tandem gait are normal.   Motor Observation:    No asymmetry, no atrophy, and no involuntary movements noted. Tone:    Normal muscle tone.    Posture:    Posture is normal. normal erect    Strength:    Strength is V/V in the upper and lower limbs.      Sensation: intact to LT     Reflex Exam:  DTR's:    Deep tendon reflexes in the upper and lower extremities are normal bilaterally.   Toes:    The toes are downgoing bilaterally.   Clonus:    Clonus is absent.    Assessment/Plan: 28 year old patient with history of migraines and new onset daily persistent headaches, occipital, MRI of the brain showed tiny incidental 3 mm dilatation in the cavernous carotid possibly in [place] versus a small aneurysm and he is following with Dr. Kathyrn Sheriff for surveillance at this time conservative treatment and clinical management.  However this is likely not the cause of his headaches.  No orders of the defined types were placed in this encounter.  Meds ordered this encounter  Medications  . Fremanezumab-vfrm (AJOVY) 225 MG/1.5ML SOAJ    Sig: Inject 225 mg into the skin every 30 (thirty) days.    Dispense:  1 pen    Refill:  11    Patient has copay card; she can have medication for $5 regardless of insurance approval or copay amount.  . ondansetron (ZOFRAN-ODT) 4 MG disintegrating tablet    Sig: Take 1 tablet (4 mg total) by mouth every 8 (eight) hours as needed for nausea.    Dispense:  30 tablet    Refill:  3  . cyclobenzaprine (FLEXERIL) 10 MG tablet    Sig: Take 0.5-1 tablets (5-10 mg total) by mouth 3 (three) times daily as needed for  muscle spasms.    Dispense:  60 tablet    Refill:  3    Cc: Copland, Jessica C, MD,    A total of 45 minutes was spent face-to-face with this patient. Over half this time was spent on counseling patient on the  1. Chronic migraine without aura without status migrainosus, not intractable   2. Acute non intractable tension-type headache    diagnosis  and different diagnostic and therapeutic options, counseling and coordination of care, risks ans benefits of management, compliance, or risk factor reduction and education.     Sarina Ill, MD  [facility] [address] [place], [place] [address]  Phone [phone] Fax [phone]

## 2019-08-08 ENCOUNTER — Encounter: Payer: Self-pay | Admitting: Neurology

## 2019-08-08 ENCOUNTER — Ambulatory Visit (INDEPENDENT_AMBULATORY_CARE_PROVIDER_SITE_OTHER): Payer: BC Managed Care – PPO | Admitting: Pharmacist Clinician (PhC)/ Clinical Pharmacy Specialist

## 2019-08-08 ENCOUNTER — Ambulatory Visit: Payer: BC Managed Care – PPO | Admitting: Neurology

## 2019-08-08 ENCOUNTER — Other Ambulatory Visit: Payer: Self-pay

## 2019-08-08 ENCOUNTER — Encounter

## 2019-08-08 VITALS — BP 127/79 | HR 68 | Temp 97.8°F | Ht 70.0 in | Wt 182.0 lb

## 2019-08-08 DIAGNOSIS — G44209 Tension-type headache, unspecified, not intractable: Secondary | ICD-10-CM

## 2019-08-08 DIAGNOSIS — I158 Other secondary hypertension: Secondary | ICD-10-CM | POA: Diagnosis not present

## 2019-08-08 DIAGNOSIS — G43709 Chronic migraine without aura, not intractable, without status migrainosus: Secondary | ICD-10-CM | POA: Diagnosis not present

## 2019-08-08 MED ORDER — AJOVY 225 MG/1.5ML ~~LOC~~ SOAJ: 225.0000 mg | SUBCUTANEOUS | 11 refills | Status: DC

## 2019-08-08 MED ORDER — CYCLOBENZAPRINE HCL 10 MG PO TABS: 5.0000 mg | ORAL_TABLET | Freq: Three times a day (TID) | ORAL | 3 refills | Status: DC | PRN

## 2019-08-08 MED ORDER — ONDANSETRON 4 MG PO TBDP: 4.0000 mg | ORAL_TABLET | Freq: Three times a day (TID) | ORAL | 3 refills | Status: DC | PRN

## 2019-08-08 MED ORDER — CHLORTHALIDONE 25 MG PO TABS: 12.5000 mg | ORAL_TABLET | Freq: Every day | ORAL | 3 refills | Status: DC

## 2019-08-08 NOTE — Patient Instructions (Signed)
Start Ajovy monthly Flexeril and zofran as needed  Fremanezumab injection What is this medicine? FREMANEZUMAB (fre ma NEZ ue mab) is used to prevent migraine headaches. This medicine may be used for other purposes; ask your health care provider or pharmacist if you have questions. COMMON BRAND NAME(S): AJOVY What should I tell my health care provider before I take this medicine? They need to know if you have any of these conditions:  an unusual or allergic reaction to fremanezumab, other medicines, foods, dyes, or preservatives  pregnant or trying to get pregnant  breast-feeding How should I use this medicine? This medicine is for injection under the skin. You will be taught how to prepare and give this medicine. Use exactly as directed. Take your medicine at regular intervals. Do not take your medicine more often than directed. It is important that you put your used needles and syringes in a special sharps container. Do not put them in a trash can. If you do not have a sharps container, call your pharmacist or healthcare provider to get one. Talk to your pediatrician regarding the use of this medicine in children. Special care may be needed. Overdosage: If you think you have taken too much of this medicine contact a poison control center or emergency room at once. NOTE: This medicine is only for you. Do not share this medicine with others. What if I miss a dose? If you miss a dose, take it as soon as you can. If it is almost time for your next dose, take only that dose. Do not take double or extra doses. What may interact with this medicine? Interactions are not expected. This list may not describe all possible interactions. Give your health care provider a list of all the medicines, herbs, non-prescription drugs, or dietary supplements you use. Also tell them if you smoke, drink alcohol, or use illegal drugs. Some items may interact with your medicine. What should I watch for while using  this medicine? Tell your doctor or healthcare professional if your symptoms do not start to get better or if they get worse. What side effects may I notice from receiving this medicine? Side effects that you should report to your doctor or health care professional as soon as possible:  allergic reactions like skin rash, itching or hives, swelling of the face, lips, or tongue Side effects that usually do not require medical attention (report these to your doctor or health care professional if they continue or are bothersome):  pain, redness, or irritation at site where injected This list may not describe all possible side effects. Call your doctor for medical advice about side effects. You may report side effects to FDA at 1-800-FDA-1088. Where should I keep my medicine? Keep out of the reach of children. You will be instructed on how to store this medicine. Throw away any unused medicine after the expiration date on the label. NOTE: This sheet is a summary. It may not cover all possible information. If you have questions about this medicine, talk to your doctor, pharmacist, or health care provider.  2020 Elsevier/Gold Standard (2017-07-19 17:22:56) Ondansetron tablets What is this medicine? ONDANSETRON (on DAN se tron) is used to treat nausea and vomiting caused by chemotherapy. It is also used to prevent or treat nausea and vomiting after surgery. This medicine may be used for other purposes; ask your health care provider or pharmacist if you have questions. COMMON BRAND NAME(S): Zofran What should I tell my health care provider before I take  this medicine? They need to know if you have any of these conditions:  heart disease  history of irregular heartbeat  liver disease  low levels of magnesium or potassium in the blood  an unusual or allergic reaction to ondansetron, granisetron, other medicines, foods, dyes, or preservatives  pregnant or trying to get  pregnant  breast-feeding How should I use this medicine? Take this medicine by mouth with a glass of water. Follow the directions on your prescription label. Take your doses at regular intervals. Do not take your medicine more often than directed. Talk to your pediatrician regarding the use of this medicine in children. Special care may be needed. Overdosage: If you think you have taken too much of this medicine contact a poison control center or emergency room at once. NOTE: This medicine is only for you. Do not share this medicine with others. What if I miss a dose? If you miss a dose, take it as soon as you can. If it is almost time for your next dose, take only that dose. Do not take double or extra doses. What may interact with this medicine? Do not take this medicine with any of the following medications:  apomorphine  certain medicines for fungal infections like fluconazole, itraconazole, ketoconazole, posaconazole, voriconazole  cisapride  dronedarone  pimozide  thioridazine This medicine may also interact with the following medications:  carbamazepine  certain medicines for depression, anxiety, or psychotic disturbances  fentanyl  linezolid  MAOIs like Carbex, Eldepryl, Marplan, Nardil, and Parnate  methylene blue (injected into a vein)  other medicines that prolong the QT interval (cause an abnormal heart rhythm) like dofetilide, ziprasidone  phenytoin  rifampicin  tramadol This list may not describe all possible interactions. Give your health care provider a list of all the medicines, herbs, non-prescription drugs, or dietary supplements you use. Also tell them if you smoke, drink alcohol, or use illegal drugs. Some items may interact with your medicine. What should I watch for while using this medicine? Check with your doctor or health care professional right away if you have any sign of an allergic reaction. What side effects may I notice from receiving  this medicine? Side effects that you should report to your doctor or health care professional as soon as possible:  allergic reactions like skin rash, itching or hives, swelling of the face, lips or tongue  breathing problems  confusion  dizziness  fast or irregular heartbeat  feeling faint or lightheaded, falls  fever and chills  loss of balance or coordination  seizures  sweating  swelling of the hands or feet  tightness in the chest  tremors  unusually weak or tired Side effects that usually do not require medical attention (report to your doctor or health care professional if they continue or are bothersome):  constipation or diarrhea  headache This list may not describe all possible side effects. Call your doctor for medical advice about side effects. You may report side effects to FDA at 1-800-FDA-1088. Where should I keep my medicine? Keep out of the reach of children. Store between 2 and 30 degrees C (36 and 86 degrees F). Throw away any unused medicine after the expiration date. NOTE: This sheet is a summary. It may not cover all possible information. If you have questions about this medicine, talk to your doctor, pharmacist, or health care provider.  2020 Elsevier/Gold Standard (2018-10-11 07:16:43)

## 2019-08-08 NOTE — Progress Notes (Signed)
08/09/2019 [name] [birth year] [id number]   HPI:  Stephen Castro is a 28 y.o. male patient of Dr Gwenlyn Found, with a PMH below who presents today for hypertension clinic evaluation.  He was referred to cardiology in August by his PCP for evaluation of possible secondary causes of hypertension, due to his young age and lack of risk factors.  Other than new onset hypertension, his medical history is significant only for a potential sinus aneurysm (or infandibulum), and migraine headaches.   He was previously treated for anxiety/depression, however had problems with SSRI medications and should not be re-challenged with those.    He has done well with losartan and amlodipine, although he notes that since starting both he had become somewhat lethargic and will "zone out" from time to time.  He has had no chest pains, shortness of breath or lower extremity edema.     Blood Pressure Goal:  130/80  Current Medications: losartan 50 mg, amlodipine 10 mg  Family Hx:   Social Hx:  Previously smoking 1/2 ppd now down to 5 cigarettes per day; (quit x 2 in the past); social alcohol; occasional coffee, no soda  Diet:  Still eating take out, although recently has cut out sodium, avoids Asian foods; trying to avoid fast food; eats lots of salads; not much of a snacker; grazes on leftovers; eats all different meat types  Exercise: no regular exercise, does enjoy hiking and walking  Home BP readings: home cuff - stethescopoe, fiancee checking (brother/father firefighters), average of the last 15 days is 143/87 with all but 2 readings be in the 140's.  Diastolics now down mostly in the 80's, although was previously as high as 100.   Intolerances: nkda (SSRI cause him to be "Mean")  Labs: 05/2019: Na 138, K 4.4, Glu 80, BUN 12, SCr 1.06  Wt Readings from Last 3 Encounters:  08/08/19 182 lb (82.6 kg)  08/08/19 182 lb (82.6 kg)  06/30/19 179 lb (81.2 kg)   BP Readings from Last 3 Encounters:   08/08/19 (!) 136/92  08/08/19 127/79  06/30/19 120/90   Pulse Readings from Last 3 Encounters:  08/08/19 72  08/08/19 68  06/30/19 86    Current Outpatient Medications  Medication Sig Dispense Refill  . albuterol (PROVENTIL HFA;VENTOLIN HFA) 108 (90 Base) MCG/ACT inhaler Inhale 2 puffs into the lungs every 6 (six) hours as needed for wheezing or shortness of breath. 1 Inhaler 0  . amLODipine (NORVASC) 10 MG tablet Take 1 tablet (10 mg total) by mouth daily. 30 tablet 1  . chlorthalidone (HYGROTON) 25 MG tablet Take 0.5 tablets (12.5 mg total) by mouth daily. 15 tablet 3  . cyclobenzaprine (FLEXERIL) 10 MG tablet Take 0.5-1 tablets (5-10 mg total) by mouth 3 (three) times daily as needed for muscle spasms. 60 tablet 3  . Fremanezumab-vfrm (AJOVY) 225 MG/1.5ML SOAJ Inject 225 mg into the skin every 30 (thirty) days. 1 pen 11  . ibuprofen (ADVIL) 200 MG tablet Take 400 mg by mouth every 6 (six) hours as needed for moderate pain.    Marland Kitchen losartan (COZAAR) 50 MG tablet Take 1 tablet (50 mg total) by mouth daily.    . ondansetron (ZOFRAN-ODT) 4 MG disintegrating tablet Take 1 tablet (4 mg total) by mouth every 8 (eight) hours as needed for nausea. 30 tablet 3   No current facility-administered medications for this visit.     No Known Allergies  Past Medical History:  Diagnosis Date  . Aneurysm (  [place]) 2020  . Anxiety    managed per pt  . Depression    "haven't really dealt with" in "quite some time"  . High blood pressure    slightly elevated    Blood pressure (!) 136/92, pulse 72, height 5\' 10"  (1.778 m), weight 182 lb (82.6 kg).  Hypertension Young patient with systolic/diastolic hypertension.  Was referred to cardiology for possible secondary causes.  He has no indication of RAS, CKD or thyroid problems.  Would recommend to Dr. Gwenlyn Found to test for pheochromocytoma and primary aldosteronism as well.  For now we will decrease the amlodipine to 5 mg and start chlorthalidone 12.5 mg.   Hopefully this will help decrease the lethargy that he feels.  If not, we can consider increasing the chlorthalidone and stopping amlodipine altogether.  He was encouraged to stop smoking.     Tommy Medal PharmD CPP [facility] [address] [place], [place] [address] [phone]

## 2019-08-08 NOTE — Patient Instructions (Signed)
Return for a a follow up appointment with Dr Gwenlyn Found on October 20  Go to the lab at the time you see Dr. Gwenlyn Found  Your blood pressure today is 136/92  Check your blood pressure at home daily and keep record of the readings.  Take your BP meds as follows:   Start chlorthalidone 12.5 mg (1/2 tablet) daily  Cut amlodipine 10 mg tablets in half and take 5 mg daily  Continue with all other medications  Bring all of your meds, your BP cuff and your record of home blood pressures to your next appointment.  Exercise as you're able, try to walk approximately 30 minutes per day.  Keep salt intake to a minimum, especially watch canned and prepared boxed foods.  Eat more fresh fruits and vegetables and fewer canned items.  Avoid eating in fast food restaurants.    HOW TO TAKE YOUR BLOOD PRESSURE: . Rest 5 minutes before taking your blood pressure. .  Don't smoke or drink caffeinated beverages for at least 30 minutes before. . Take your blood pressure before (not after) you eat. . Sit comfortably with your back supported and both feet on the floor (don't cross your legs). . Elevate your arm to heart level on a table or a desk. . Use the proper sized cuff. It should fit smoothly and snugly around your bare upper arm. There should be enough room to slip a fingertip under the cuff. The bottom edge of the cuff should be 1 inch above the crease of the elbow. . Ideally, take 3 measurements at one sitting and record the average.

## 2019-08-09 ENCOUNTER — Encounter: Payer: Self-pay | Admitting: Pharmacist Clinician (PhC)/ Clinical Pharmacy Specialist

## 2019-08-09 DIAGNOSIS — G44201 Tension-type headache, unspecified, intractable: Secondary | ICD-10-CM | POA: Diagnosis not present

## 2019-08-09 DIAGNOSIS — M9903 Segmental and somatic dysfunction of lumbar region: Secondary | ICD-10-CM | POA: Diagnosis not present

## 2019-08-09 DIAGNOSIS — M545 Low back pain: Secondary | ICD-10-CM | POA: Diagnosis not present

## 2019-08-09 DIAGNOSIS — M542 Cervicalgia: Secondary | ICD-10-CM | POA: Diagnosis not present

## 2019-08-09 NOTE — Assessment & Plan Note (Signed)
Young patient with systolic/diastolic hypertension.  Was referred to cardiology for possible secondary causes.  He has no indication of RAS, CKD or thyroid problems.  Would recommend to Dr. Gwenlyn Found to test for pheochromocytoma and primary aldosteronism as well.  For now we will decrease the amlodipine to 5 mg and start chlorthalidone 12.5 mg.  Hopefully this will help decrease the lethargy that he feels.  If not, we can consider increasing the chlorthalidone and stopping amlodipine altogether.  He was encouraged to stop smoking.

## 2019-08-10 ENCOUNTER — Other Ambulatory Visit: Payer: Self-pay

## 2019-08-10 MED ORDER — AMLODIPINE BESYLATE 10 MG PO TABS: 10.0000 mg | ORAL_TABLET | Freq: Every day | ORAL | 3 refills | Status: DC

## 2019-08-22 ENCOUNTER — Ambulatory Visit: Payer: BC Managed Care – PPO | Admitting: Cardiovascular Disease

## 2019-08-22 ENCOUNTER — Encounter: Payer: Self-pay | Admitting: Cardiovascular Disease

## 2019-08-22 ENCOUNTER — Other Ambulatory Visit: Payer: Self-pay

## 2019-08-22 VITALS — BP 122/80 | HR 83 | Temp 95.2°F | Ht 67.0 in | Wt 180.0 lb

## 2019-08-22 DIAGNOSIS — I158 Other secondary hypertension: Secondary | ICD-10-CM

## 2019-08-22 DIAGNOSIS — D3500 Benign neoplasm of unspecified adrenal gland: Secondary | ICD-10-CM | POA: Diagnosis not present

## 2019-08-22 NOTE — Progress Notes (Signed)
08/22/2019 Stephen Castro   [birth year]  [id number]  Primary Physician Copland, Gay Filler, MD Primary Cardiologist: Lorretta Harp MD FACP, [place], [place], [place]  HPI:  Stephen Castro is a 28 y.o.  thin and fit appearing engaged Caucasian male with no children was engaged to his Stephen Castro when I saw him 2 months ago and is since gotten married. He was referred by his PCP, Dr. Larose Kells, for eval evaluation of potential secondary causes of new onset hypertension.  I last saw him in the office 06/30/2019. He has no other risk factors.  He does drink several beers a night and does have a history of anxiety depression on SSRIs in the past.  There is a family history of cerebral aneurysms.  He has had new onset headaches and hypertension over the last several weeks to months.  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 Leonie Man.  He was placed on amlodipine and losartan which has been titrated with improvement in his blood pressures and headaches.  Renal Dopplers were performed that did not show evidence of renal artery stenosis. A 2D echocardiogram performed 05/23/2019 showed normal LV size and function with mild concentric LVH.  I had him see Stephen Castro in the office to review her blood pressure log and she began him on chlorthalidone.  His "mental fogginess" has improved as has his blood pressure control.  He is paying attention to his diet and reducing salt and alcohol intake.  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   Current Meds  Medication Sig  . albuterol (PROVENTIL HFA;VENTOLIN HFA) 108 (90 Base) MCG/ACT inhaler Inhale 2 puffs into the lungs every 6 (six) hours as needed for wheezing or shortness of breath.  Marland Kitchen amLODipine (NORVASC) 10 MG tablet Take 1 tablet (10 mg total) by mouth daily.  . chlorthalidone (HYGROTON) 25 MG tablet Take 0.5 tablets (12.5 mg total) by mouth daily.  . cyclobenzaprine (FLEXERIL) 10 MG tablet Take 0.5-1 tablets (5-10 mg  total) by mouth 3 (three) times daily as needed for muscle spasms.  . Fremanezumab-vfrm (AJOVY) 225 MG/1.5ML SOAJ Inject 225 mg into the skin every 30 (thirty) days.  Marland Kitchen ibuprofen (ADVIL) 200 MG tablet Take 400 mg by mouth every 6 (six) hours as needed for moderate pain.  Marland Kitchen losartan (COZAAR) 50 MG tablet Take 1 tablet (50 mg total) by mouth daily.  . ondansetron (ZOFRAN-ODT) 4 MG disintegrating tablet Take 1 tablet (4 mg total) by mouth every 8 (eight) hours as needed for nausea.     No Known Allergies  Social History   Socioeconomic History  . Marital status: Significant Other    Spouse name: Not on file  . Number of children: Not on file  . Years of education: Not on file  . Highest education level: Some college, no degree  Occupational History  . Not on file  Social Needs  . Financial resource strain: Not on file  . Food insecurity    Worry: Not on file    Inability: Not on file  . Transportation needs    Medical: Not on file    Non-medical: Not on file  Tobacco Use  . Smoking status: Current Every Day Smoker    Packs/day: 0.50    Types: Cigarettes  . Smokeless tobacco: Never Used  . Tobacco comment: 08/08/2019 update, trying to cut back   Substance and Sexual Activity  . Alcohol use: Yes  Comment: Stopped drinking a year and a half ago. update 08/08/2019 drinks "a little bit but not often"  . Drug use: No  . Sexual activity: Yes    Birth control/protection: Condom  Lifestyle  . Physical activity    Days per week: Not on file    Minutes per session: Not on file  . Stress: Not on file  Relationships  . Social Herbalist on phone: Not on file    Gets together: Not on file    Attends religious service: Not on file    Active member of club or organization: Not on file    Attends meetings of clubs or organizations: Not on file    Relationship status: Not on file  . Intimate partner violence    Fear of current or ex partner: Not on file    Emotionally  abused: Not on file    Physically abused: Not on file    Forced sexual activity: Not on file  Other Topics Concern  . Not on file  Social History Narrative   Lives at home with his fiance [name]   Right handed   Caffeine: rarely a red bull, not daily. Coffee some, not daily.     Review of Systems: General: negative for chills, fever, night sweats or weight changes.  Cardiovascular: negative for chest pain, dyspnea on exertion, edema, orthopnea, palpitations, paroxysmal nocturnal dyspnea or shortness of breath Dermatological: negative for rash Respiratory: negative for cough or wheezing Urologic: negative for hematuria Abdominal: negative for nausea, vomiting, diarrhea, bright red blood per rectum, melena, or hematemesis Neurologic: negative for visual changes, syncope, or dizziness All other systems reviewed and are otherwise negative except as noted above.    Blood pressure 122/80, pulse 83, temperature (!) 95.2 F (35.1 C), height 5\' 7"  (1.702 m), weight 180 lb (81.6 kg).  General appearance: alert and no distress Neck: no adenopathy, no carotid bruit, no JVD, supple, symmetrical, trachea midline and thyroid not enlarged, symmetric, no tenderness/mass/nodules Lungs: clear to auscultation bilaterally Heart: regular rate and rhythm, S1, S2 normal, no murmur, click, rub or gallop Extremities: extremities normal, atraumatic, no cyanosis or edema Pulses: 2+ and symmetric Skin: Skin color, texture, turgor normal. No rashes or lesions Neurologic: Alert and oriented X 3, normal strength and tone. Normal symmetric reflexes. Normal coordination and gait  EKG not performed today  ASSESSMENT AND PLAN:   Hypertension Stephen Castro returns today for follow-up.  Blood pressures under better control on his current medical regimen.  He has seen Stephen Castro in the office for follow-up of his blood pressure log.  Chlorthalidone was added and amlodipine has plan to be decreased.  Renal Dopplers are  normal.  Go to get 24-hour urine for metanephrines.  I will have him see Stephen Castro back in 1 month me back in 6 months.  Blood pressure today is 122/80.  He has fewer headaches as well.      Lorretta Harp MD FACP,FACC,FAHA, [facility] 08/22/2019 4:02 PM

## 2019-08-22 NOTE — Assessment & Plan Note (Signed)
Stephen Castro returns today for follow-up.  Blood pressures under better control on his current medical regimen.  He has seen [name] in the office for follow-up of his blood pressure log.  Chlorthalidone was added and amlodipine has plan to be decreased.  Renal Dopplers are normal.  Go to get 24-hour urine for metanephrines.  I will have him see Cyril Mourning back in 1 month me back in 6 months.  Blood pressure today is 122/80.  He has fewer headaches as well.

## 2019-08-22 NOTE — Patient Instructions (Addendum)
Medication Instructions:  Your physician recommends that you continue on your current medications as directed. Please refer to the Current Medication list given to you today.  If you need a refill on your cardiac medications before your next appointment, please call your pharmacy.   Lab work: Your physician recommends that you return for lab work at your earliest convenience: [place] TEST-metanephrines  Urine Metanephrines Test Why am I having this test? The urine metanephrines test is used to check for a rare type of adrenal gland tumor called a pheochromocytoma. These tumors are usually not cancerous (are benign), but they create excess hormones that can cause symptoms. You may have this test if you have been treated for a pheochromocytoma or if you have symptoms of having one. These symptoms include:  High blood pressure.  Headaches.  Rapid heartbeat.  Sweating. What is being tested? This test measures the amount of metanephrines in your urine. Metanephrines are substances that result from the breakdown of two hormones, epinephrine and norepinephrine. These hormones are made by your adrenal glands, which are located on top of your kidneys. Adrenal glands release these hormones during times of stress. After they are released, the hormones break down into metanephrine and normetanephrine and leave your body in your urine. It is normal to find a small amount of metanephrines in urine. However, if you have a large amount of metanephrines in your urine, this can be a sign of a pheochromocytoma tumor. What kind of sample is taken? A urine sample is required for this test. Your health care provider will give you a container to collect all the urine you produce over 24 hours. How do I collect samples at home?  You may be asked to collect a urine sample at home over a 24-hour period. Follow instructions from a health care provider about how to collect the sample. When collecting a urine  sample at home, make sure you:  Use supplies and instructions that you received from the lab.  Collect urine only in the germ-free (sterile) cup that you received from the lab.  Do not let any toilet paper or stool (feces) get into the cup.  Refrigerate the sample until you can return it to the lab.  Return the samples to the lab as instructed. How do I prepare for this test? Many medicines, some foods, and heavy exercise can increase urinary metanephrines.  Let your health care provider know about all medicines you are taking, including vitamins, herbs, eye drops, creams, and over-the-counter medicines. You may need to stop taking them before the test.  Do not exercise heavily before or during the testing period as told by your health care provider.  Follow instructions from your health care provider about what foods or drinks you should avoid before and during the test. How are the results reported? Your test results will be reported as values. Your health care provider will compare your results to normal ranges that were established after testing a large group of people (reference ranges). Reference ranges may vary among labs and hospitals. For this test, common reference ranges are:  Metanephrine: Less than 1.3 mg/24 hr or less than 7 micromoles/day (SI units).  Normetanephrine: 15-80 mcg/24 hr or 89-473 nmol/day (SI units). What do the results mean? Abnormally high levels of metanephrine and normetanephrine may mean:  You have a pheochromocytoma.  A previous pheochromocytoma has returned after treatment. Abnormally high levels could also mean that your test result was a false positive. This happens when  something else causes your metanephrines to go up. Stress or certain medicines can make this happen. Your health care provider may do more tests to confirm a diagnosis of pheochromocytoma. If your test results are within the reference ranges, you are unlikely to have a  pheochromocytoma. Talk with your health care provider about what your results mean. Questions to ask your health care provider Ask your health care provider, or the department that is doing the test:  When will my results be ready?  How will I get my results?  What are my treatment options?  What other tests do I need?  What are my next steps? Summary  The urine metanephrines test is used to check for an adrenal gland tumor called a pheochromocytoma.  Symptoms of this type of tumor include high blood pressure, elevated heart rate, excessive sweating, and headaches.  False-positive results can occur. Your health care provider may do more tests to confirm a diagnosis of pheochromocytoma.  If your test results are within the reference ranges, you are unlikely to have a pheochromocytoma. This information is not intended to replace advice given to you by your health care provider. Make sure you discuss any questions you have with your health care provider. Document Released: 11/13/2004 Document Revised: 06/08/2018 Document Reviewed: 06/17/2017 Elsevier Patient Education  [employer].   If you have labs (blood work) drawn today and your tests are completely normal, you will receive your results only by: Marland Kitchen MyChart Message (if you have MyChart) OR . A paper copy in the mail If you have any lab test that is abnormal or we need to change your treatment, we will call you to review the results.  Testing/Procedures: none  Follow-Up: At [facility], you and your health needs are our priority.  As part of our continuing mission to provide you with exceptional heart care, we have created designated Provider Care Teams.  These Care Teams include your primary Cardiologist (physician) and Advanced Practice Providers (APPs -  Physician Assistants and Nurse Practitioners) who all work together to provide you with the care you need, when you need it. You will need a follow up appointment in  6 months with Dr. Quay Burow.  Please call our office 2 months in advance to schedule this appointment.   Any Other Special Instructions Will Be Listed Below (If Applicable). KEEP A BLOOD PRESSURE LOG FOR 30 DAYS THEN FOLLOW UP WITH A CLINICAL PHARMACIST IN THE HYPERTENSION CLINIC. YOU WILL NEED AN IN-PERSON APPOINTMENT IN THE OFFICE. PLEASE CALL TO SCHEDULE THIS IN-PERSON APPOINTMENT IF YOU DID NOT ALREADY DO SO DURING YOUR LAST VISIT AT [facility].

## 2019-08-23 DIAGNOSIS — G44201 Tension-type headache, unspecified, intractable: Secondary | ICD-10-CM | POA: Diagnosis not present

## 2019-08-23 DIAGNOSIS — M545 Low back pain: Secondary | ICD-10-CM | POA: Diagnosis not present

## 2019-08-23 DIAGNOSIS — M9903 Segmental and somatic dysfunction of lumbar region: Secondary | ICD-10-CM | POA: Diagnosis not present

## 2019-08-23 DIAGNOSIS — M542 Cervicalgia: Secondary | ICD-10-CM | POA: Diagnosis not present

## 2019-09-06 DIAGNOSIS — M9903 Segmental and somatic dysfunction of lumbar region: Secondary | ICD-10-CM | POA: Diagnosis not present

## 2019-09-06 DIAGNOSIS — M545 Low back pain: Secondary | ICD-10-CM | POA: Diagnosis not present

## 2019-09-06 DIAGNOSIS — G44201 Tension-type headache, unspecified, intractable: Secondary | ICD-10-CM | POA: Diagnosis not present

## 2019-09-06 DIAGNOSIS — M542 Cervicalgia: Secondary | ICD-10-CM | POA: Diagnosis not present

## 2019-09-19 ENCOUNTER — Telehealth: Payer: Self-pay | Admitting: *Deleted

## 2019-09-19 DIAGNOSIS — D3500 Benign neoplasm of unspecified adrenal gland: Secondary | ICD-10-CM | POA: Diagnosis not present

## 2019-09-19 NOTE — Telephone Encounter (Signed)
Completed Ajovy PA on CMM. [employer] Castro - Rx #[id number]. Awaiting [employer] [place] determination.   Per CMM: If [employer] has not responded in 3 business days or if you have any questions about your submission, contact [place] at [phone].

## 2019-09-20 ENCOUNTER — Encounter: Payer: Self-pay | Admitting: *Deleted

## 2019-09-20 NOTE — Telephone Encounter (Signed)
Received denial from [place]. Pt required a trial of Emgality and Aimovig first. Also requires pt documentation that pt has 8 or more migraine days per month.   If we should choose to appeal, fax to [phone].   Pt should be able to use the savings card for the Ajovy. Messaged pt in [place].

## 2019-09-20 NOTE — Telephone Encounter (Signed)
FYI-Paris B. called from [facility] [place] to inform provider that the request for the Ajovy has been denied. [phone]

## 2019-09-21 ENCOUNTER — Ambulatory Visit (INDEPENDENT_AMBULATORY_CARE_PROVIDER_SITE_OTHER): Payer: BC Managed Care – PPO | Admitting: Pharmacist Clinician (PhC)/ Clinical Pharmacy Specialist

## 2019-09-21 ENCOUNTER — Other Ambulatory Visit: Payer: Self-pay

## 2019-09-21 VITALS — BP 142/88 | HR 92 | Resp 14 | Ht 70.0 in | Wt 180.0 lb

## 2019-09-21 DIAGNOSIS — I158 Other secondary hypertension: Secondary | ICD-10-CM

## 2019-09-21 MED ORDER — CHLORTHALIDONE 25 MG PO TABS: 25.0000 mg | ORAL_TABLET | Freq: Every day | ORAL | 3 refills | Status: DC

## 2019-09-21 NOTE — Patient Instructions (Addendum)
Return for a a follow up appointment December 17  Go to the lab in 10-14 days for blood work to check kidney function  (week after Thanksgiving)  Your blood pressure today is 142/88  Check your blood pressure at home daily  and keep record of the readings.  Take your BP meds as follows:  Increase chlorthalidone to 1 tablet daily  Discontinue amlodipine   Continue with all other medications  Bring all of your meds, your BP cuff and your record of home blood pressures to your next appointment.  Exercise as you're able, try to walk approximately 30 minutes per day.  Keep salt intake to a minimum, especially watch canned and prepared boxed foods.  Eat more fresh fruits and vegetables and fewer canned items.  Avoid eating in fast food restaurants.    HOW TO TAKE YOUR BLOOD PRESSURE: . Rest 5 minutes before taking your blood pressure. .  Don't smoke or drink caffeinated beverages for at least 30 minutes before. . Take your blood pressure before (not after) you eat. . Sit comfortably with your back supported and both feet on the floor (don't cross your legs). . Elevate your arm to heart level on a table or a desk. . Use the proper sized cuff. It should fit smoothly and snugly around your bare upper arm. There should be enough room to slip a fingertip under the cuff. The bottom edge of the cuff should be 1 inch above the crease of the elbow. . Ideally, take 3 measurements at one sitting and record the average.

## 2019-09-21 NOTE — Progress Notes (Signed)
09/27/2019 [name] 1991/02/11 [id number]   HPI:  Stephen Castro is a 28 y.o. male patient of Dr Gwenlyn Found, with a PMH below who presents today for hypertension clinic evaluation.  He was referred to cardiology in August by his PCP for evaluation of possible secondary causes of hypertension, due to his young age and lack of risk factors.  Other than new onset hypertension, his medical history is significant only for a potential sinus aneurysm (or infandibulum), and migraine headaches.   He was previously treated for anxiety/depression, however had problems with SSRI medications and should not be re-challenged with those.  At his last visit we decreased the amlodipine to 5 mg and added chlorthalidone 12.5 mg.  Dr. Gwenlyn Found ordered a urine metanephrine panel to rule out pheochromocytoma.  He turned this in earlier this week.    Today he notes that his home blood pressures are better, but still not to goal.  See readings below.  Since his last visit he got married, and was able to take a short honeymoon.  He has now had two doses of Ajovy to try and stop migraines, and reports not much change yet.  He forgot his home blood pressure readings, but will e-mail them when he gets home today.      Blood Pressure Goal:  130/80  Current Medications: losartan 50 mg, amlodipine 5 mg, chlorthalidone 12.5 mg   Family Hx: nothing significant  Social Hx:  Previously smoking 1/2 ppd now down to 5 cigarettes per day; (quit x 2 in the past); social alcohol; occasional coffee, no soda  Diet:  Still eating take out, although recently has cut out sodium, avoids Asian foods; trying to avoid fast food; eats lots of salads; not much of a snacker; grazes on leftovers; eats all different meat types  Exercise: no regular exercise, does enjoy hiking and walking  Home BP readings: home cuff - st[place], fiancee checking (brother/father firefighters), average of the last 15 days is 143/87 with all but 2 readings be  in the 140's.  Diastolics now down mostly in the 80's, although was previously as high as 100.   Intolerances: nkda (SSRI cause him to be "Mean")  Labs: 05/2019: Na 138, K 4.4, Glu 80, BUN 12, SCr 1.06  Wt Readings from Last 3 Encounters:  09/21/19 180 lb (81.6 kg)  08/22/19 180 lb (81.6 kg)  08/08/19 182 lb (82.6 kg)   BP Readings from Last 3 Encounters:  09/21/19 (!) 142/88  08/22/19 122/80  08/08/19 (!) 136/92   Pulse Readings from Last 3 Encounters:  09/21/19 92  08/22/19 83  08/08/19 72    Current Outpatient Medications  Medication Sig Dispense Refill  . albuterol (PROVENTIL HFA;VENTOLIN HFA) 108 (90 Base) MCG/ACT inhaler Inhale 2 puffs into the lungs every 6 (six) hours as needed for wheezing or shortness of breath. 1 Inhaler 0  . amLODipine (NORVASC) 10 MG tablet Take 1 tablet (10 mg total) by mouth daily. 30 tablet 3  . cyclobenzaprine (FLEXERIL) 10 MG tablet Take 0.5-1 tablets (5-10 mg total) by mouth 3 (three) times daily as needed for muscle spasms. 60 tablet 3  . Fremanezumab-vfrm (AJOVY) 225 MG/1.5ML SOAJ Inject 225 mg into the skin every 30 (thirty) days. 1 pen 11  . ibuprofen (ADVIL) 200 MG tablet Take 400 mg by mouth every 6 (six) hours as needed for moderate pain.    Marland Kitchen losartan (COZAAR) 50 MG tablet Take 1 tablet (50 mg total) by mouth daily.    Marland Kitchen  chlorthalidone (HYGROTON) 25 MG tablet Take 1 tablet (25 mg total) by mouth daily. 90 tablet 3  . ondansetron (ZOFRAN-ODT) 4 MG disintegrating tablet Take 1 tablet (4 mg total) by mouth every 8 (eight) hours as needed for nausea. (Patient not taking: Reported on 09/21/2019) 30 tablet 3   No current facility-administered medications for this visit.     No Known Allergies  Past Medical History:  Diagnosis Date  . Aneurysm ([place]) 2020  . Anxiety    managed per pt  . Depression    "haven't really dealt with" in "quite some time"  . High blood pressure    slightly elevated    Blood pressure (!) 142/88, pulse 92,  resp. rate 14, height 5\' 10"  (1.778 m), weight 180 lb (81.6 kg), SpO2 98 %.  Hypertension Patient with ongoing problems with hypertension, still not controlled.  Will increase the chlorthalidone to 25 mg daily and discontinue amlodipine (to determine if this is the cause of his lethargy and feeling "out of focus").  He will continue with regular BP checks.  Will also watch for results of 24 hr urine metanephrines - he reports turning that in to the lab earlier this week.  Other secondary causes of hypertension to consider would be sleep apnea   Tommy Medal PharmD CPP [place] [address] [place],  [id number] [phone]

## 2019-09-21 NOTE — Assessment & Plan Note (Addendum)
Patient with ongoing problems with hypertension, still not controlled.  Will increase the chlorthalidone to 25 mg daily and discontinue amlodipine (to determine if this is the cause of his lethargy and feeling "out of focus").  He will continue with regular BP checks.  Will also watch for results of 24 hr urine metanephrines - he reports turning that in to the lab earlier this week.  Other secondary causes of hypertension to consider would be sleep apnea

## 2019-09-26 LAB — METANEPHRINES, URINE, 24 HOUR
Metaneph Total, Ur: 61 ug/L
Metanephrines, 24H Ur: 93 ug/24 hr (ref 58–276)
Normetanephrine, 24H Ur: 136 ug/24 hr (ref 110–553)
Normetanephrine, Ur: 89 ug/L

## 2019-09-27 ENCOUNTER — Encounter: Payer: Self-pay | Admitting: Pharmacist Clinician (PhC)/ Clinical Pharmacy Specialist

## 2019-10-11 DIAGNOSIS — Z20828 Contact with and (suspected) exposure to other viral communicable diseases: Secondary | ICD-10-CM | POA: Diagnosis not present

## 2019-10-18 NOTE — Telephone Encounter (Signed)
Received PA for Ajovy again however one was just denied last month. I called the pt. He has been using the savings card. I encouraged him to continue this and advised it was denied d/t not meeting requirements by insurance. Pt is having 3-4 headaches per week. States it is difficult to differentiate between migraine vs headache as the intensity varies but he may still have migraine features. He was advised we can try to do another PA at the beginning of the year. He should continue to use the savings card. When the one he has expires he can either get another one online or pickup from the office. He verbalized appreciation for the call. His questions were answered.

## 2019-10-19 ENCOUNTER — Ambulatory Visit: Payer: BC Managed Care – PPO

## 2019-10-30 ENCOUNTER — Telehealth: Payer: Self-pay

## 2019-10-30 NOTE — Telephone Encounter (Signed)
PA done again on cover my meds. Was denied initially in 08/2019. Your information has been submitted to [place]. [employer] [place] will review the request and fax you a determination directly, typically within 3 business days of your submission once all necessary information is received. If [employer] [place] has not responded in 3 business days or if you have any questions about your submission, contact [facility] [place] at [phone].

## 2019-10-31 NOTE — Telephone Encounter (Signed)
This request has received a Cancelled outcome.  This may mean either your patient does not have active coverage with this plan, this authorization was processed as a duplicate request, or an authorization was not needed for this medication.  Note any additional information provided by [employer] [place] at the bottom of this request, and contact [employer] [place] directly for further details. 

## 2019-11-02 ENCOUNTER — Ambulatory Visit: Payer: BC Managed Care – PPO

## 2019-11-06 NOTE — Telephone Encounter (Signed)
I receive fax from [facility]. It was denied. The fax stated medication is not on the formulary and is covered when Aimovig and Emgality 120mg  has been tired and did not work. Pt has savings card for Ajovy to use.

## 2019-11-08 ENCOUNTER — Telehealth: Payer: Self-pay | Admitting: *Deleted

## 2019-11-08 ENCOUNTER — Encounter: Payer: Self-pay | Admitting: Medical

## 2019-11-08 ENCOUNTER — Other Ambulatory Visit: Payer: Self-pay

## 2019-11-08 ENCOUNTER — Ambulatory Visit (INDEPENDENT_AMBULATORY_CARE_PROVIDER_SITE_OTHER): Payer: BC Managed Care – PPO | Admitting: Medical

## 2019-11-08 VITALS — BP 155/65 | Ht 70.0 in | Wt 155.0 lb

## 2019-11-08 DIAGNOSIS — M545 Low back pain, unspecified: Secondary | ICD-10-CM

## 2019-11-08 DIAGNOSIS — S46819A Strain of other muscles, fascia and tendons at shoulder and upper arm level, unspecified arm, initial encounter: Secondary | ICD-10-CM

## 2019-11-08 DIAGNOSIS — G43709 Chronic migraine without aura, not intractable, without status migrainosus: Secondary | ICD-10-CM | POA: Diagnosis not present

## 2019-11-08 DIAGNOSIS — I10 Essential (primary) hypertension: Secondary | ICD-10-CM | POA: Diagnosis not present

## 2019-11-08 DIAGNOSIS — R195 Other fecal abnormalities: Secondary | ICD-10-CM | POA: Diagnosis not present

## 2019-11-08 MED ORDER — ONDANSETRON 4 MG PO TBDP: 4.0000 mg | ORAL_TABLET | Freq: Three times a day (TID) | ORAL | 0 refills | Status: DC | PRN

## 2019-11-08 MED ORDER — TRAMADOL HCL 50 MG PO TABS: 50.0000 mg | ORAL_TABLET | Freq: Four times a day (QID) | ORAL | 0 refills | Status: AC | PRN

## 2019-11-08 NOTE — Patient Instructions (Signed)
You have recent acute onset of GI type signs and symptoms since this morning.  The symptoms are loose stools, vomiting, nausea and upset stomach.  This is early onset and will need to see how you progress.  Presently recommend hydrate well with propel fitness water or sugar-free Gatorade.  Eat bland foods such as breads, crackers and soups.  Avoid any greasy/fried foods.  Will prescribe Zofran for nausea/vomiting.  If you have recurrent loose stools then recommend getting Imodium AD over-the-counter.  Currently recommend holding off to see if your pattern truly becomes a diarrhea-like.  For history of hypertension, continue with current blood pressure medication.  A little high presently but he did not take BP medication today.  History of chronic headaches and you mention history of known aneurysm as well.  If your headaches become severe or any gross motor/sensory function deficits then recommend ED evaluation.  Your recent neck pain/trapezius muscle type pain along with low back pain that occurred with a vomiting episode this morning.  Continue with the Flexeril and will make available low number of tramadol tablets to use.  You could also use low-dose ibuprofen but recommend not using it blood pressure is above 140/90.  Asked patient to give [place] an update via MyChart in 2 days.  We will see if his signs symptoms progress or change.  Then decide on official follow-up.   Also did explain to patient that with acute onset of his symptoms explained above we will see how things go.  Small percentage of patients with GI symptoms and have Covid.  Explained to patient more accurate Covid test could be done later if he worsens.  Presently I do not think Covid test indicated.

## 2019-11-08 NOTE — Telephone Encounter (Signed)
Copied from [place] [id number]. Topic: Appointment Scheduling - Scheduling Inquiry for Clinic >> Nov 07, 2019 12:56 PM Stephen Castro wrote: Reason for CRM: Patient is wanting to set up an appt for nausea and back pain today or tomorrow . Please advise

## 2019-11-08 NOTE — Progress Notes (Signed)
Subjective:    Patient ID: Stephen Castro, male    DOB: April 19, 1991, 29 y.o.   MRN: [id number]  HPI  Virtual Visit via Video Note  I connected with Stephen Castro on 11/08/19 at 10:40 AM EST by a video enabled telemedicine application and verified that I am speaking with the correct person using two identifiers.  Location: Patient: home Provider: office  Pt has not checked bp today.    I discussed the limitations of evaluation and management by telemedicine and the availability of in person appointments. The patient expressed understanding and agreed to proceed.    History of Present Illness: Pt states yesterday morning he woke up with episodes of nausea and vomiting.   He states he was more dry heaving. When he vomited he state thru his back out and had onset of neck pain. Back pain is in lower back. Pt states when neck pain started he could move neck. But now has trapezius tenderness.   Pt had one large loose stool/watery today. Pt has not been on any antibiotic recently.  Pt states has some flexeril. He used flexeril for chronic HA. Pt screening head ache specialist. bp is usually between [id number] systolic.     Observations/Objective: General-no acute distress, pleasant, oriented. Lungs- on inspection lungs appear unlabored. Neck- no tracheal deviation or jvd on inspection. Neuro- gross motor function appears intact. Finger to nose. Symmetric smile. No upper extremity drift.  Assessment and Plan: You have recent acute onset of GI type signs and symptoms since this morning.  The symptoms are loose stools, vomiting, nausea and upset stomach.  This is early onset and will need to see how you progress.  Presently recommend hydrate well with propel fitness water or sugar-free Gatorade.  Eat bland foods such as breads, crackers and soups.  Avoid any greasy/fried foods.  Will prescribe Zofran for nausea/vomiting.  If you have recurrent loose stools then recommend getting Imodium  AD over-the-counter.  Currently recommend holding off to see if your pattern truly becomes a diarrhea-like.  For history of hypertension, continue with current blood pressure medication.  A little high presently but he did not take BP medication today.  History of chronic headaches and you mention history of known aneurysm as well.  If your headaches become severe or any gross motor/sensory function deficits then recommend ED evaluation.  Your recent neck pain/trapezius muscle type pain along with low back pain that occurred with a vomiting episode this morning.  Continue with the Flexeril and will make available low number of tramadol tablets to use.  You could also use low-dose ibuprofen but recommend not using it blood pressure is above 140/90.  Asked patient to give [place] an update via MyChart in 2 days.  We will see if his signs symptoms progress or change.  Then decide on official follow-up.   Also did explain to patient that with acute onset of his symptoms explained above we will see how things go.  Small percentage of patients with GI symptoms and have Covid.  Explained to patient more accurate Covid test could be done later if he worsens.  Presently I do not think Covid test indicated.  Follow Up Instructions:    I discussed the assessment and treatment plan with the patient. The patient was provided an opportunity to ask questions and all were answered. The patient agreed with the plan and demonstrated an understanding of the instructions.   The patient was advised to call back or seek an  in-person evaluation if the symptoms worsen or if the condition fails to improve as anticipated.  I provided 20  minutes of non-face-to-face time during this encounter.   Mackie Pai, PA-C   Review of Systems  Constitutional: Positive for fatigue. Negative for chills and fever.  HENT: Negative for congestion.        Normal sense of smell today.  Respiratory: Positive for cough.        Some  cough today before he started throwing up.  Cardiovascular: Negative for chest pain and palpitations.  Gastrointestinal: Positive for diarrhea. Negative for abdominal distention, abdominal pain, constipation, nausea and vomiting.  Musculoskeletal: Positive for myalgias and neck pain.  Neurological: Positive for headaches. Negative for dizziness, tremors, weakness and numbness.       He has hx of chronic ha that are daily. Get ha 5-6 times a week. Pt takes ajovy one a month. His injection is coming up.  Hematological: Negative for adenopathy. Does not bruise/bleed easily.  Psychiatric/Behavioral: Negative for agitation and confusion.       Objective:   Physical Exam        Assessment & Plan:

## 2019-11-14 DIAGNOSIS — M542 Cervicalgia: Secondary | ICD-10-CM | POA: Diagnosis not present

## 2019-11-14 DIAGNOSIS — M545 Low back pain: Secondary | ICD-10-CM | POA: Diagnosis not present

## 2019-11-14 DIAGNOSIS — M9903 Segmental and somatic dysfunction of lumbar region: Secondary | ICD-10-CM | POA: Diagnosis not present

## 2019-11-14 DIAGNOSIS — G44201 Tension-type headache, unspecified, intractable: Secondary | ICD-10-CM | POA: Diagnosis not present

## 2019-11-16 DIAGNOSIS — M9903 Segmental and somatic dysfunction of lumbar region: Secondary | ICD-10-CM | POA: Diagnosis not present

## 2019-11-16 DIAGNOSIS — M545 Low back pain: Secondary | ICD-10-CM | POA: Diagnosis not present

## 2019-11-16 DIAGNOSIS — G44201 Tension-type headache, unspecified, intractable: Secondary | ICD-10-CM | POA: Diagnosis not present

## 2019-11-16 DIAGNOSIS — M542 Cervicalgia: Secondary | ICD-10-CM | POA: Diagnosis not present

## 2019-11-22 DIAGNOSIS — M545 Low back pain: Secondary | ICD-10-CM | POA: Diagnosis not present

## 2019-11-22 DIAGNOSIS — G44201 Tension-type headache, unspecified, intractable: Secondary | ICD-10-CM | POA: Diagnosis not present

## 2019-11-22 DIAGNOSIS — M542 Cervicalgia: Secondary | ICD-10-CM | POA: Diagnosis not present

## 2019-11-22 DIAGNOSIS — M9903 Segmental and somatic dysfunction of lumbar region: Secondary | ICD-10-CM | POA: Diagnosis not present

## 2019-12-13 DIAGNOSIS — M542 Cervicalgia: Secondary | ICD-10-CM | POA: Diagnosis not present

## 2019-12-13 DIAGNOSIS — G44201 Tension-type headache, unspecified, intractable: Secondary | ICD-10-CM | POA: Diagnosis not present

## 2019-12-13 DIAGNOSIS — M545 Low back pain: Secondary | ICD-10-CM | POA: Diagnosis not present

## 2019-12-13 DIAGNOSIS — M9903 Segmental and somatic dysfunction of lumbar region: Secondary | ICD-10-CM | POA: Diagnosis not present

## 2019-12-14 ENCOUNTER — Ambulatory Visit: Payer: BC Managed Care – PPO | Admitting: Family Medicine

## 2019-12-14 ENCOUNTER — Ambulatory Visit (HOSPITAL_BASED_OUTPATIENT_CLINIC_OR_DEPARTMENT_OTHER)
Admission: RE | Admit: 2019-12-14 | Discharge: 2019-12-14 | Disposition: A | Discharge status: Home / Self Care | Payer: BC Managed Care – PPO | Source: Ambulatory Visit | Attending: Family Medicine | Admitting: Family Medicine

## 2019-12-14 ENCOUNTER — Other Ambulatory Visit: Payer: Self-pay

## 2019-12-14 ENCOUNTER — Encounter: Payer: Self-pay | Admitting: Family Medicine

## 2019-12-14 VITALS — BP 148/92 | HR 79 | Temp 97.3°F | Resp 18 | Ht 70.0 in | Wt 175.0 lb

## 2019-12-14 DIAGNOSIS — S29019A Strain of muscle and tendon of unspecified wall of thorax, initial encounter: Secondary | ICD-10-CM | POA: Diagnosis not present

## 2019-12-14 DIAGNOSIS — R05 Cough: Secondary | ICD-10-CM

## 2019-12-14 DIAGNOSIS — R030 Elevated blood-pressure reading, without diagnosis of hypertension: Secondary | ICD-10-CM | POA: Diagnosis not present

## 2019-12-14 DIAGNOSIS — R059 Cough, unspecified: Secondary | ICD-10-CM

## 2019-12-14 MED ORDER — HYDROCODONE-ACETAMINOPHEN 5-325 MG PO TABS: 1.0000 | ORAL_TABLET | Freq: Three times a day (TID) | ORAL | 0 refills | Status: AC | PRN

## 2019-12-14 MED ORDER — METHOCARBAMOL 500 MG PO TABS: 500.0000 mg | ORAL_TABLET | Freq: Three times a day (TID) | ORAL | 0 refills | Status: DC | PRN

## 2019-12-14 NOTE — Patient Instructions (Signed)
It was good to see you today- I am sorry you are so miserable!   Chest x-ray today Assuming this is negative I think your issue is spasm of your back muscles We can use hydrocodone as needed for more severe pain I also gave you robaxin to use as needed as a muscle relaxer.  Please use caution as both of these medications can cause sedation.  Avoid using in combination unless absolutely necessary, and do not mix with tramadol or flexeril  Continue to see your chiro and start PT if you like  Please let me know if not significantly better in the next 2-3 days- Sooner if worse.

## 2019-12-14 NOTE — Progress Notes (Signed)
[place] at [facility] [address] [place], [place] [address] [phone]  Date:  12/14/2019   Name:  Stephen Castro   DOB:  02-24-1991   MRN:  [id number]  PCP:  Darreld Mclean, MD    Chief Complaint: Back Pain (pain after coughing spells, usually sees chiropractor, inflammed )   History of Present Illness:  Stephen Castro is a 29 y.o. very pleasant male patient who presents with the following:  Pt with history of HTN, brain aneurysm, ADHD, anxiety, pheochromocytoma  Last seen by myself in August 2020 He was diagnosed with a small brain aneurysm on MRI July 2020  He is seen cardiology-both Dr. Gwenlyn Found and pharmacist-  Renal artery Dopplers normal Normal echo July 2020 He was seen by cardiology most recently in October, at that time his blood pressure was under better control.  They plan to work him up for possible pheochromocytoma with 24-hour urine metanephrines; he did this test in November, it was negative  He was also seen Dr. Jaynee Eagles with neurology for headaches Most recent visit in October   Amlodipine Losartan 50 Chlorthalidone Ajovy- this does seem to be helping reduce frequency of migraine HA  About a month ago he woke up coughing and his back seized up He saw his chiro for a couple of weeks and seemed to be getting better- however he then had to quarantine for a couple of weeks due to covid exposure He was negative for covid himself He is alternating heat and ice, doing some yoga and back with his chiropractor  He feels like he might get hot and cold sometimes  He is not really coughing at this time  He notes that when he moves his hips he may have a popping in his ribs The pain will radiate into his neck  He is using flexeril at night, but this makes him too sleepy so he cannot take during the day  No fever noted He got tested for covid 10 days ago- negative  Never had this issue with his back in the  past His chiro took some x-rays of his neck and it sounds like loss of lordosis  BP Readings from Last 3 Encounters:  12/14/19 (!) 148/92  11/08/19 (!) 155/65  09/21/19 (!) 142/88   He notes that his BP is higher since he has not been sleeping as well, he sometimes forgets his meds and is taking more ibuprofen    11/08/2019  1   11/08/2019  Tramadol Hcl 50 MG Tablet      Patient Active Problem List   Diagnosis Date Noted  . Chronic migraine without aura without status migrainosus, not intractable 08/08/2019  . Hypertension 06/30/2019  . Brain aneurysm 06/17/2019  . Gastroenteritis 04/13/2019  . Shoulder injury, left, initial encounter 01/24/2018  . ADHD (attention deficit hyperactivity disorder) 04/04/2013  . Anxiety state, unspecified 04/04/2013    Past Medical History:  Diagnosis Date  . Aneurysm ([place]) 2020  . Anxiety    managed per pt  . Depression    "haven't really dealt with" in "quite some time"  . High blood pressure    slightly elevated    History reviewed. No pertinent surgical history.  Social History   Tobacco Use  . Smoking status: Current Every Day Smoker    Packs/day: 0.50    Types: Cigarettes  . Smokeless tobacco: Never Used  . Tobacco comment: 08/08/2019 update, trying to cut  back   Substance Use Topics  . Alcohol use: Yes    Comment: Stopped drinking a year and a half ago. update 08/08/2019 drinks "a little bit but not often"  . Drug use: No    Family History  Problem Relation Age of Onset  . Stroke Mother        carotid artery dissection during massage   . Aneurysm Paternal Grandmother   . Hypertension Other        both sides of family  . Lung cancer Other        both sides of family   . Cerebral aneurysm Maternal Uncle   . Migraines Neg Hx        none that he is aware of    No Known Allergies  Medication list has been reviewed and updated.  Current Outpatient Medications on File Prior to Visit  Medication Sig Dispense Refill   . albuterol (PROVENTIL HFA;VENTOLIN HFA) 108 (90 Base) MCG/ACT inhaler Inhale 2 puffs into the lungs every 6 (six) hours as needed for wheezing or shortness of breath. 1 Inhaler 0  . amLODipine (NORVASC) 10 MG tablet Take 1 tablet (10 mg total) by mouth daily. 30 tablet 3  . chlorthalidone (HYGROTON) 25 MG tablet Take 1 tablet (25 mg total) by mouth daily. 90 tablet 3  . cyclobenzaprine (FLEXERIL) 10 MG tablet Take 0.5-1 tablets (5-10 mg total) by mouth 3 (three) times daily as needed for muscle spasms. 60 tablet 3  . Fremanezumab-vfrm (AJOVY) 225 MG/1.5ML SOAJ Inject 225 mg into the skin every 30 (thirty) days. 1 pen 11  . ibuprofen (ADVIL) 200 MG tablet Take 400 mg by mouth every 6 (six) hours as needed for moderate pain.    Marland Kitchen losartan (COZAAR) 50 MG tablet Take 1 tablet (50 mg total) by mouth daily.    . ondansetron (ZOFRAN ODT) 4 MG disintegrating tablet Take 1 tablet (4 mg total) by mouth every 8 (eight) hours as needed for nausea or vomiting. 20 tablet 0   No current facility-administered medications on file prior to visit.    Review of Systems:  As per HPI- otherwise negative.   Physical Examination: Vitals:   12/14/19 1256  BP: (!) 148/92  Pulse: 79  Resp: 18  Temp: (!) 97.3 F (36.3 C)  SpO2: 99%   Vitals:   12/14/19 1256  Weight: 175 lb (79.4 kg)  Height: 5\' 10"  (1.778 m)   Body mass index is 25.11 kg/m. Ideal Body Weight: Weight in (lb) to have BMI = 25: 173.9  GEN: no acute distress.  Looks well, but is moving slowly and stiffly when he stands up to walk HEENT: Atraumatic, Normocephalic.  Ears and Nose: No external deformity. CV: RRR, No M/G/R. No JVD. No thrill. No extra heart sounds. PULM: CTA B, no wheezes, crackles, rhonchi. No retractions. No resp. distress. No accessory muscle use. ABD: S, NT, ND, +BS. No rebound. No HSM. EXTR: No c/c/e PSYCH: Normally interactive. Conversant.  Patient has significant tightness and spasm in the thoracic muscles, left  worse than right.  No bony tenderness of the spine is noted.  He has decreased spine forward flexion, normal extension.  Normal strength, sensation, DTR of all extremities. He denies any bowel or bladder incontinence   Assessment and Plan: Cough - Plan: DG Chest 2 View  Thoracic myofascial strain, initial encounter - Plan: methocarbamol (ROBAXIN) 500 MG tablet, HYDROcodone-acetaminophen (NORCO/VICODIN) 5-325 MG tablet  Malek is here today with back spasm.  This seemed  to start after a coughing fit about 1 month ago.  He was going to his chiropractor for a couple of weeks and made good progress, but then he had to stop treatment due to COVID-19 quarantine.  He turned out to be negative for COVID-19, but the lapse in chiropractic treatment seems to have worsened his back pain.    He is currently having quite a bit of pain and spasm.  He has Flexeril that he uses mostly for headaches-he has tried taking this for current pain, but it makes him too drowsy to use during the day He has tried some tramadol, but it is not controlling his discomfort  On exam today he has significant pain and stiffness.  I suspect that his pain is leading to increased spasm and vice versa.  We will use a stronger pain reliever for a brief period of time to try and get his pain under control.  I have also prescribed Robaxin which should be less sedating than Flexeril for him to try  He also continues to have some cough, may feel hot and cold at times.  We will do a chest film to rule out pneumonia  This visit occurred during the SARS-CoV-2 public health emergency.  Safety protocols were in place, including screening questions prior to the visit, additional usage of staff PPE, and extensive cleaning of exam room while observing appropriate contact time as indicated for disinfecting solutions.    Signed Lamar Blinks, MD  Received chest film- called to give report as his mychart is not working well  DG Chest 2  View  Result Date: 12/14/2019 CLINICAL DATA:  Hx high bp with medication. Pain in lower back for 1 month pt sts no other chest complaints. EXAM: CHEST - 2 VIEW COMPARISON:  12/29/2007 FINDINGS: Normal heart, mediastinum and hila. Clear lungs.  No pleural effusion or pneumothorax. Skeletal structures are within normal limits. IMPRESSION: Normal chest radiographs. Electronically Signed   By: Lajean Manes M.D.   On: 12/14/2019 13:45

## 2019-12-19 ENCOUNTER — Encounter: Payer: Self-pay | Admitting: Family Medicine

## 2019-12-19 DIAGNOSIS — M9903 Segmental and somatic dysfunction of lumbar region: Secondary | ICD-10-CM | POA: Diagnosis not present

## 2019-12-19 DIAGNOSIS — M545 Low back pain: Secondary | ICD-10-CM | POA: Diagnosis not present

## 2019-12-19 DIAGNOSIS — G44201 Tension-type headache, unspecified, intractable: Secondary | ICD-10-CM | POA: Diagnosis not present

## 2019-12-19 DIAGNOSIS — M542 Cervicalgia: Secondary | ICD-10-CM | POA: Diagnosis not present

## 2019-12-20 ENCOUNTER — Other Ambulatory Visit: Payer: Self-pay

## 2019-12-20 ENCOUNTER — Ambulatory Visit (INDEPENDENT_AMBULATORY_CARE_PROVIDER_SITE_OTHER): Payer: BC Managed Care – PPO | Admitting: Family Medicine

## 2019-12-20 ENCOUNTER — Encounter: Payer: Self-pay | Admitting: Family Medicine

## 2019-12-20 DIAGNOSIS — J029 Acute pharyngitis, unspecified: Secondary | ICD-10-CM

## 2019-12-20 MED ORDER — PENICILLIN V POTASSIUM 500 MG PO TABS: 500.0000 mg | ORAL_TABLET | Freq: Two times a day (BID) | ORAL | 0 refills | Status: DC

## 2019-12-20 NOTE — Progress Notes (Signed)
[place] at [facility] [address], [place], [place] [address] [phone]  Date:  12/20/2019   Name:  Stephen Castro   DOB:  1991-03-21   MRN:  [id number]  PCP:  Darreld Mclean, MD    Chief Complaint: No chief complaint on file.   History of Present Illness:  Stephen Castro is a 29 y.o. very pleasant male patient who presents with the following:  Virtual visit today to discuss concern of illness Patient location is home, provider location is office.  Patient identity confirmed with 2 factors, he gives consent for virtual visit today.  The patient and myself are present on the call today  History of hypertension, brain aneurysm, ADHD, anxiety,?  Pheochromocytoma-per my understanding his laboratory evaluation has indicated he does not actually have a pheochromocytoma Small brain aneurysm diagnosed on MRI July 2020  He has seen both cardiology and neurology I saw this patient just recently on February 11 with concern of cough and back pain-at that time we prescribed Robaxin and hydrocodone for back spasm His back is much better- he has been seeing his chiropractor and pain is much reduced The robaxin did not make him as sleepy so he could take it during the day  Yesterday he noted a tender gland in his right anterior cervical chain.  As the day went on, he developed a sore throat and lymphadenopathy on the left side as well His throat hurts to swallow When he examines his throat he does not see any redness or exudate No fever Mild headache and cough His glands are quite sore to touch  No vomiting except for one episode of posttussive emesis  Patient Active Problem List   Diagnosis Date Noted  . Chronic migraine without aura without status migrainosus, not intractable 08/08/2019  . Hypertension 06/30/2019  . Brain aneurysm 06/17/2019  . Gastroenteritis 04/13/2019  . Shoulder injury, left, initial encounter 01/24/2018   . ADHD (attention deficit hyperactivity disorder) 04/04/2013  . Anxiety state, unspecified 04/04/2013    Past Medical History:  Diagnosis Date  . Aneurysm ([place]) 2020  . Anxiety    managed per pt  . Depression    "haven't really dealt with" in "quite some time"  . High blood pressure    slightly elevated    No past surgical history on file.  Social History   Tobacco Use  . Smoking status: Current Every Day Smoker    Packs/day: 0.50    Types: Cigarettes  . Smokeless tobacco: Never Used  . Tobacco comment: 08/08/2019 update, trying to cut back   Substance Use Topics  . Alcohol use: Yes    Comment: Stopped drinking a year and a half ago. update 08/08/2019 drinks "a little bit but not often"  . Drug use: No    Family History  Problem Relation Age of Onset  . Stroke Mother        carotid artery dissection during massage   . Aneurysm Paternal Grandmother   . Hypertension Other        both sides of family  . Lung cancer Other        both sides of family   . Cerebral aneurysm Maternal Uncle   . Migraines Neg Hx        none that he is aware of    No Known Allergies  Medication list has been reviewed and updated.  Current Outpatient Medications on File Prior to  Visit  Medication Sig Dispense Refill  . albuterol (PROVENTIL HFA;VENTOLIN HFA) 108 (90 Base) MCG/ACT inhaler Inhale 2 puffs into the lungs every 6 (six) hours as needed for wheezing or shortness of breath. 1 Inhaler 0  . amLODipine (NORVASC) 10 MG tablet Take 1 tablet (10 mg total) by mouth daily. 30 tablet 3  . chlorthalidone (HYGROTON) 25 MG tablet Take 1 tablet (25 mg total) by mouth daily. 90 tablet 3  . cyclobenzaprine (FLEXERIL) 10 MG tablet Take 0.5-1 tablets (5-10 mg total) by mouth 3 (three) times daily as needed for muscle spasms. 60 tablet 3  . Fremanezumab-vfrm (AJOVY) 225 MG/1.5ML SOAJ Inject 225 mg into the skin every 30 (thirty) days. 1 pen 11  . ibuprofen (ADVIL) 200 MG tablet Take 400 mg by mouth  every 6 (six) hours as needed for moderate pain.    Marland Kitchen losartan (COZAAR) 50 MG tablet Take 1 tablet (50 mg total) by mouth daily.    . methocarbamol (ROBAXIN) 500 MG tablet Take 1 tablet (500 mg total) by mouth every 8 (eight) hours as needed for muscle spasms. 30 tablet 0  . ondansetron (ZOFRAN ODT) 4 MG disintegrating tablet Take 1 tablet (4 mg total) by mouth every 8 (eight) hours as needed for nausea or vomiting. 20 tablet 0   No current facility-administered medications on file prior to visit.    Review of Systems:  As per HPI- otherwise negative.   Physical Examination: There were no vitals filed for this visit. There were no vitals filed for this visit. There is no height or weight on file to calculate BMI. Ideal Body Weight:    Pt observed via video monitor.  He looks well, no cough, wheezing, or distress is noted  Assessment and Plan: Pharyngitis, unspecified etiology - Plan: penicillin v potassium (VEETID) 500 MG tablet  Virtual visit today for pharyngitis with lymphadenopathy, possible strep pharyngitis.  We will have him start on penicillin 500 twice daily for 10 days.  Advised patient that if he does not see a good response in 1 to 2 days, a nonstrep etiology such as a viral infection is more likely.  In that case would encourage him to have a COVID-19 test, let me know if I can otherwise be helpful.  Signed Lamar Blinks, MD

## 2019-12-22 ENCOUNTER — Ambulatory Visit: Payer: BC Managed Care – PPO

## 2019-12-22 NOTE — Progress Notes (Deleted)
HPI:  Stephen Castro is a 29 y.o. male patient of Dr Gwenlyn Found, with a PMH below who presents today for hypertension clinic follow up.  He was referred to cardiology in August by his PCP for evaluation of possible secondary causes of hypertension, due to his young age and lack of risk factors.  Other than new onset hypertension, his medical history is significant only for a potential sinus aneurysm (or infandibulum), and migraine headaches.   He was previously treated for anxiety/depression, however had problems with SSRI medications and should not be re-challenged with those. Dr. Gwenlyn Found ordered a urine metanephrine panel to rule out pheochromocytoma and test was negative.  During last OV we increased chlorthalidone dose from 12.5mg  daily to 25mg  daily.    BMET??  Blood Pressure Goal:  130/80  Current Medications: losartan 50 mg daily amlodipine 10 mg daily chlorthalidone 25 mg daily  Family Hx: nothing significant  Social Hx:  Previously smoking 1/2 ppd now down to 5 cigarettes per day; (quit x 2 in the past); social alcohol; occasional coffee, no soda  Diet:  Still eating take out, although recently has cut out sodium, avoids Asian foods; trying to avoid fast food; eats lots of salads; not much of a snacker; grazes on leftovers; eats all different meat types  Exercise: no regular exercise, does enjoy hiking and walking  Home BP readings:  home cuff - [place], fiancee checking (brother/father firefighters)  Intolerances: nkda (SSRI cause him to be "Mean")  Labs: 05/2019: Na 138, K 4.4, Glu 80, BUN 12, SCr 1.06  Wt Readings from Last 3 Encounters:  12/14/19 175 lb (79.4 kg)  11/08/19 155 lb (70.3 kg)  09/21/19 180 lb (81.6 kg)   BP Readings from Last 3 Encounters:  12/14/19 (!) 148/92  11/08/19 (!) 155/65  09/21/19 (!) 142/88   Pulse Readings from Last 3 Encounters:  12/14/19 79  09/21/19 92  08/22/19 83    Current Outpatient Medications  Medication Sig Dispense  Refill  . albuterol (PROVENTIL HFA;VENTOLIN HFA) 108 (90 Base) MCG/ACT inhaler Inhale 2 puffs into the lungs every 6 (six) hours as needed for wheezing or shortness of breath. 1 Inhaler 0  . amLODipine (NORVASC) 10 MG tablet Take 1 tablet (10 mg total) by mouth daily. 30 tablet 3  . chlorthalidone (HYGROTON) 25 MG tablet Take 1 tablet (25 mg total) by mouth daily. 90 tablet 3  . cyclobenzaprine (FLEXERIL) 10 MG tablet Take 0.5-1 tablets (5-10 mg total) by mouth 3 (three) times daily as needed for muscle spasms. 60 tablet 3  . Fremanezumab-vfrm (AJOVY) 225 MG/1.5ML SOAJ Inject 225 mg into the skin every 30 (thirty) days. 1 pen 11  . ibuprofen (ADVIL) 200 MG tablet Take 400 mg by mouth every 6 (six) hours as needed for moderate pain.    Marland Kitchen losartan (COZAAR) 50 MG tablet Take 1 tablet (50 mg total) by mouth daily.    . methocarbamol (ROBAXIN) 500 MG tablet Take 1 tablet (500 mg total) by mouth every 8 (eight) hours as needed for muscle spasms. 30 tablet 0  . ondansetron (ZOFRAN ODT) 4 MG disintegrating tablet Take 1 tablet (4 mg total) by mouth every 8 (eight) hours as needed for nausea or vomiting. 20 tablet 0  . penicillin v potassium (VEETID) 500 MG tablet Take 1 tablet (500 mg total) by mouth 2 (two) times daily. 20 tablet 0   No current facility-administered medications for this visit.    No Known Allergies  Past  Medical History:  Diagnosis Date  . Aneurysm ([place]) 2020  . Anxiety    managed per pt  . Depression    "haven't really dealt with" in "quite some time"  . High blood pressure    slightly elevated    There were no vitals taken for this visit.  No problem-specific Assessment & Plan notes found for this encounter.

## 2019-12-26 ENCOUNTER — Encounter: Payer: Self-pay | Admitting: Family Medicine

## 2019-12-27 DIAGNOSIS — M9903 Segmental and somatic dysfunction of lumbar region: Secondary | ICD-10-CM | POA: Diagnosis not present

## 2019-12-27 DIAGNOSIS — M545 Low back pain: Secondary | ICD-10-CM | POA: Diagnosis not present

## 2019-12-27 DIAGNOSIS — G44201 Tension-type headache, unspecified, intractable: Secondary | ICD-10-CM | POA: Diagnosis not present

## 2019-12-27 DIAGNOSIS — M542 Cervicalgia: Secondary | ICD-10-CM | POA: Diagnosis not present

## 2020-01-03 DIAGNOSIS — M545 Low back pain: Secondary | ICD-10-CM | POA: Diagnosis not present

## 2020-01-03 DIAGNOSIS — M9903 Segmental and somatic dysfunction of lumbar region: Secondary | ICD-10-CM | POA: Diagnosis not present

## 2020-01-03 DIAGNOSIS — M542 Cervicalgia: Secondary | ICD-10-CM | POA: Diagnosis not present

## 2020-01-03 DIAGNOSIS — G44201 Tension-type headache, unspecified, intractable: Secondary | ICD-10-CM | POA: Diagnosis not present

## 2020-01-09 ENCOUNTER — Telehealth: Payer: Self-pay | Admitting: Family Medicine

## 2020-01-09 NOTE — Progress Notes (Signed)
[place] at [facility] [address], [place], [place] [address] [phone]  Date:  01/10/2020   Name:  Stephen Castro   DOB:  10/26/1991   MRN:  [id number]  PCP:  Darreld Mclean, MD    Chief Complaint: No chief complaint on file.   History of Present Illness:  Stephen Castro is a 29 y.o. very pleasant male patient who presents with the following:  Patient with history of brain aneurysm, migraine, ADHD, anxiety, hypertension  I saw him recently-February 17-for virtual visit with concern of sore throat and cervical lymphadenopathy We treated him with penicillin 500 for 10 days; he finished it right at the end of February  His mother contacted me yesterday and reported that Stephen Castro symptoms had resolved but then seemed to come back so we arranged a virtual visit today  Pt location is home, provider is at office Pt ID confirmed with 2 factors, he gives consent for virtual visit today The patient and myself are present on the call today  He notes that his sx seemed to come back right just a couple of days after he finished his abx; his symptoms actually seem more severe than they were originally at this time He has noted chills and hot flashes His temperature however has been normal when he checks with a thermometer He is also coughing up mucus frequently, his throat is sore and glands seem swollen No vomiting or diarrhea  He does have some body aches His wife is not ill  He got tested for covid most recently a month ago   Patient Active Problem List   Diagnosis Date Noted  . Chronic migraine without aura without status migrainosus, not intractable 08/08/2019  . Hypertension 06/30/2019  . Brain aneurysm 06/17/2019  . Gastroenteritis 04/13/2019  . Shoulder injury, left, initial encounter 01/24/2018  . ADHD (attention deficit hyperactivity disorder) 04/04/2013  . Anxiety state, unspecified 04/04/2013    Past Medical  History:  Diagnosis Date  . Aneurysm ([place]) 2020  . Anxiety    managed per pt  . Depression    "haven't really dealt with" in "quite some time"  . High blood pressure    slightly elevated    No past surgical history on file.  Social History   Tobacco Use  . Smoking status: Current Every Day Smoker    Packs/day: 0.50    Types: Cigarettes  . Smokeless tobacco: Never Used  . Tobacco comment: 08/08/2019 update, trying to cut back   Substance Use Topics  . Alcohol use: Yes    Comment: Stopped drinking a year and a half ago. update 08/08/2019 drinks "a little bit but not often"  . Drug use: No    Family History  Problem Relation Age of Onset  . Stroke Mother        carotid artery dissection during massage   . Aneurysm Paternal Grandmother   . Hypertension Other        both sides of family  . Lung cancer Other        both sides of family   . Cerebral aneurysm Maternal Uncle   . Migraines Neg Hx        none that he is aware of    No Known Allergies  Medication list has been reviewed and updated.  Current Outpatient Medications on File Prior to Visit  Medication Sig Dispense Refill  . albuterol (PROVENTIL HFA;VENTOLIN HFA) 108 (90  Base) MCG/ACT inhaler Inhale 2 puffs into the lungs every 6 (six) hours as needed for wheezing or shortness of breath. 1 Inhaler 0  . amLODipine (NORVASC) 10 MG tablet Take 1 tablet (10 mg total) by mouth daily. 30 tablet 3  . chlorthalidone (HYGROTON) 25 MG tablet Take 1 tablet (25 mg total) by mouth daily. 90 tablet 3  . cyclobenzaprine (FLEXERIL) 10 MG tablet Take 0.5-1 tablets (5-10 mg total) by mouth 3 (three) times daily as needed for muscle spasms. 60 tablet 3  . Fremanezumab-vfrm (AJOVY) 225 MG/1.5ML SOAJ Inject 225 mg into the skin every 30 (thirty) days. 1 pen 11  . ibuprofen (ADVIL) 200 MG tablet Take 400 mg by mouth every 6 (six) hours as needed for moderate pain.    Marland Kitchen losartan (COZAAR) 50 MG tablet Take 1 tablet (50 mg total) by mouth  daily.    . methocarbamol (ROBAXIN) 500 MG tablet Take 1 tablet (500 mg total) by mouth every 8 (eight) hours as needed for muscle spasms. 30 tablet 0  . ondansetron (ZOFRAN ODT) 4 MG disintegrating tablet Take 1 tablet (4 mg total) by mouth every 8 (eight) hours as needed for nausea or vomiting. 20 tablet 0  . penicillin v potassium (VEETID) 500 MG tablet Take 1 tablet (500 mg total) by mouth 2 (two) times daily. 20 tablet 0   No current facility-administered medications on file prior to visit.    Review of Systems: As per HPI- otherwise negative.    Physical Examination: There were no vitals filed for this visit. There were no vitals filed for this visit. There is no height or weight on file to calculate BMI. Ideal Body Weight:    Home BP 158/82 at most recent check; he does have HTN at baseline Spoke with patient over the telephone.  We had to leave doxy call as sound quality was insufficient Patient sounds Castro except for occasional cough.  No wheezing or distress is noted Assessment and Plan: Cough - Plan: cefdinir (OMNICEF) 300 MG capsule  Virtual visit today for concern of illness.  About 4 weeks ago we treated with penicillin for possible strep throat.  Unfortunately his sore throat is come back, he also now has cough and chills.  I advised him to be tested for COVID-19 ASAP, as this may certainly be the culprit.  He agrees to do so.  I will also treat him with 10 days of cefdinir to cover any resistant bacteria or pneumonia Assuming he does not have Covid, I am glad to see him in the office next week for an in person visit and chest x-ray I have asked him to contact me or otherwise seek care if he is getting worse I have also advised a probiotic since we are using a second antibiotic  Signed Lamar Blinks, MD

## 2020-01-09 NOTE — Telephone Encounter (Signed)
Received a mychart message from his mom that he is not feeling well.  Will reach out to him.  Stephen Castro can you please give him a call and see what is going on- I think his sore throat came back

## 2020-01-10 ENCOUNTER — Encounter: Payer: Self-pay | Admitting: Family Medicine

## 2020-01-10 ENCOUNTER — Ambulatory Visit (INDEPENDENT_AMBULATORY_CARE_PROVIDER_SITE_OTHER): Payer: BC Managed Care – PPO | Admitting: Family Medicine

## 2020-01-10 ENCOUNTER — Other Ambulatory Visit: Payer: Self-pay

## 2020-01-10 DIAGNOSIS — R05 Cough: Secondary | ICD-10-CM | POA: Diagnosis not present

## 2020-01-10 DIAGNOSIS — Z20828 Contact with and (suspected) exposure to other viral communicable diseases: Secondary | ICD-10-CM | POA: Diagnosis not present

## 2020-01-10 DIAGNOSIS — R059 Cough, unspecified: Secondary | ICD-10-CM

## 2020-01-10 MED ORDER — CEFDINIR 300 MG PO CAPS: 300.0000 mg | ORAL_CAPSULE | Freq: Two times a day (BID) | ORAL | 0 refills | Status: DC

## 2020-01-10 NOTE — Telephone Encounter (Signed)
Patient has appointment this afternoon with pcp.

## 2020-01-23 ENCOUNTER — Telehealth: Payer: Self-pay | Admitting: Family Medicine

## 2020-01-23 NOTE — Telephone Encounter (Signed)
Called him- he needs to be seen for a CBC and mono, but difficult due to covid.  I will need to discuss with my office manager Urgent care is too expensive for him

## 2020-01-23 NOTE — Telephone Encounter (Signed)
Caller : Bates Dennington Call Back [phone]   Patient is calling in regards to sore throat and neck glands. Patient saw Dr Lorelei Pont, last week via [facility], patient has is still having issues, please advise

## 2020-01-24 ENCOUNTER — Ambulatory Visit (INDEPENDENT_AMBULATORY_CARE_PROVIDER_SITE_OTHER): Payer: BC Managed Care – PPO | Admitting: Family Medicine

## 2020-01-24 ENCOUNTER — Encounter: Payer: Self-pay | Admitting: Family Medicine

## 2020-01-24 ENCOUNTER — Other Ambulatory Visit: Payer: Self-pay

## 2020-01-24 VITALS — BP 142/84 | HR 87

## 2020-01-24 DIAGNOSIS — R590 Localized enlarged lymph nodes: Secondary | ICD-10-CM | POA: Diagnosis not present

## 2020-01-24 DIAGNOSIS — J302 Other seasonal allergic rhinitis: Secondary | ICD-10-CM

## 2020-01-24 DIAGNOSIS — J029 Acute pharyngitis, unspecified: Secondary | ICD-10-CM | POA: Diagnosis not present

## 2020-01-24 MED ORDER — LIDOCAINE VISCOUS HCL 2 % MT SOLN: 15.0000 mL | OROMUCOSAL | 0 refills | Status: DC | PRN

## 2020-01-24 MED ORDER — PREDNISONE 50 MG PO TABS: 50.0000 mg | ORAL_TABLET | Freq: Every day | ORAL | 0 refills | Status: AC

## 2020-01-24 NOTE — Progress Notes (Signed)
Patient ID: Stephen Castro, male    DOB: 05-31-1991, 29 y.o.   MRN: [id number]  PCP: Darreld Mclean, MD  No chief complaint on file.   Subjective:  HPI Stephen Castro is a 29 y.o. male presents to [facility] clinic for evaluation of sore throat and cervical adenopathy.  Onset: 3 weeks ago    Severity: moderate Tried OTC meds without significant relief. Swollen cervical adenopathy, tender cervical lymph nodes, chills, skin soreness Treated with antibiotics twice with temporary improvement of symptom Endorses a mild cough with production occurring upon awakening in the morning.  Patient endorses that he is a chronic daily smoker and has a cycle of cough with sputum production every morning chronically.  He denies any fever, nausea, vomiting, rash shortness of breath, weakness, dizziness, body aches or chills.  Patient has had several recent negative COVID-19 test.  He also reports that he has had a recent strep test which was also negative.  BP is mildly elevated today.  Patient is followed by cardiology for management of hypertension.  He does check his blood pressure at home and denies headache or dizziness at present.  Review of Systems Pertinent negatives listed in HPI Patient Active Problem List   Diagnosis Date Noted  . Chronic migraine without aura without status migrainosus, not intractable 08/08/2019  . Hypertension 06/30/2019  . Brain aneurysm 06/17/2019  . Gastroenteritis 04/13/2019  . Shoulder injury, left, initial encounter 01/24/2018  . ADHD (attention deficit hyperactivity disorder) 04/04/2013  . Anxiety state, unspecified 04/04/2013      Prior to Admission medications   Medication Sig Start Date End Date Taking? Authorizing Provider  albuterol (PROVENTIL HFA;VENTOLIN HFA) 108 (90 Base) MCG/ACT inhaler Inhale 2 puffs into the lungs every 6 (six) hours as needed for wheezing or shortness of breath. 02/13/16   Copland, Gay Filler, MD  amLODipine (NORVASC) 10  MG tablet Take 1 tablet (10 mg total) by mouth daily. 08/10/19   Copland, Gay Filler, MD  cefdinir (OMNICEF) 300 MG capsule Take 1 capsule (300 mg total) by mouth 2 (two) times daily. 01/10/20   Copland, Gay Filler, MD  chlorthalidone (HYGROTON) 25 MG tablet Take 1 tablet (25 mg total) by mouth daily. 09/21/19 12/20/19  Lorretta Harp, MD  cyclobenzaprine (FLEXERIL) 10 MG tablet Take 0.5-1 tablets (5-10 mg total) by mouth 3 (three) times daily as needed for muscle spasms. 08/08/19   Melvenia Beam, MD  Fremanezumab-vfrm (AJOVY) 225 MG/1.5ML SOAJ Inject 225 mg into the skin every 30 (thirty) days. 08/08/19   Melvenia Beam, MD  ibuprofen (ADVIL) 200 MG tablet Take 400 mg by mouth every 6 (six) hours as needed for moderate pain.    [provider]  losartan (COZAAR) 50 MG tablet Take 1 tablet (50 mg total) by mouth daily. 06/08/19   Colon Branch, MD  methocarbamol (ROBAXIN) 500 MG tablet Take 1 tablet (500 mg total) by mouth every 8 (eight) hours as needed for muscle spasms. 12/14/19   Copland, Gay Filler, MD  ondansetron (ZOFRAN ODT) 4 MG disintegrating tablet Take 1 tablet (4 mg total) by mouth every 8 (eight) hours as needed for nausea or vomiting. 11/08/19   Saguier, [name], PA-C  penicillin v potassium (VEETID) 500 MG tablet Take 1 tablet (500 mg total) by mouth 2 (two) times daily. 12/20/19   Copland, Gay Filler, MD    Past Medical, Surgical Family and Social History reviewed and updated.    Objective:   Today's Vitals  01/24/20 1730  BP: (!) 142/84  Pulse: 87  SpO2: 99%    Wt Readings from Last 3 Encounters:  12/14/19 175 lb (79.4 kg)  11/08/19 155 lb (70.3 kg)  09/21/19 180 lb (81.6 kg)     Physical Exam Constitutional:      General: He is not in acute distress.    Appearance: He is not ill-appearing.  HENT:     Mouth/Throat:     Mouth: Mucous membranes are moist.     Pharynx: Uvula midline. Posterior oropharyngeal erythema present. No oropharyngeal exudate or uvula  swelling.     Tonsils: No tonsillar exudate. 3+ on the right. 3+ on the left.  Cardiovascular:     Rate and Rhythm: Normal rate and regular rhythm.  Pulmonary:     Effort: Pulmonary effort is normal.     Breath sounds: Normal breath sounds and air entry.  Lymphadenopathy:     Cervical: Cervical adenopathy present.  Skin:    General: Skin is warm and dry.  Neurological:     Mental Status: He is alert.  Psychiatric:        Attention and Perception: Attention normal.        Mood and Affect: Mood normal.      Assessment & Plan:  1. Cervical adenopathy -Suspect cervical adenitis given tenderness and enlargement of lymph nodes.  Will trial a course of prednisone 50 mg x 3 days while also encouraging use of antihistamine as this could be related to environmental allergies.  CBC was not performed today given patient has been asymptomatic of fever body aches have already been treated with 2 courses of antibiotics.  CBC would not  likely yield any benefit and would not change my course of treatment. Would recommend further work-up for other causes of if symptoms do not resolve with prescribed treatment. Follow-up with PCP and possible ENT if symptoms presents.  2. Sore throat -Exam findings are not impressive for streptococcal infection.  Unable to rule out mono given test not available at site.  However less likely mono patient has been treated to antibiotics and reports no improvement of symptoms temporarily with symptoms recurring.  Suspect viral in nature symptomatic treatment warranted at present.  Red flags discussed.  -Lidocaine viscous prescribed.  For throat pain. 3. Seasonal allergies -Resume use of oral antihistamine OTC whenever patient preference.     -The patient was given clear instructions to go to ER or return to medical center if symptoms do not improve, worsen or new problems develop. The patient verbalized understanding.     Molli Barrows, FNP-C [facility], PRN Provider  [facility] [place] [place] [facility] Phone: [phone] Clinic Fax: [phone] Clinic Hours: 5:30 pm -7:30 pm (Monday-Friday)

## 2020-01-24 NOTE — Telephone Encounter (Signed)
Discussed with Estill Bamberg- my office manager- who suggested respiratory clinic for this pt.  We will get him scheduled.  Thank you Estill Bamberg!

## 2020-01-24 NOTE — Patient Instructions (Addendum)
Start Cetrizine 10 mg daily at home prior to bedtime Take prednisone  50 mg once daily x  3 days with breakfast. If no improvement follow-up with PCP for a referral to ENT for further evaluation    Sore Throat When you have a sore throat, your throat may feel:  Tender.  Burning.  Irritated.  Scratchy.  Painful when you swallow.  Painful when you talk. Many things can cause a sore throat, such as:  An infection.  Allergies.  Dry air.  Smoke or pollution.  Radiation treatment.  Gastroesophageal reflux disease (GERD).  A tumor. A sore throat can be the first sign of another sickness. It can happen with other problems, like:  Coughing.  Sneezing.  Fever.  Swelling in the neck. Most sore throats go away without treatment. Follow these instructions at home:      Take over-the-counter medicines only as told by your doctor. ? If your child has a sore throat, do not give your child aspirin.  Drink enough fluids to keep your pee (urine) pale yellow.  Rest when you feel you need to.  To help with pain: ? Sip warm liquids, such as broth, herbal tea, or warm water. ? Eat or drink cold or frozen liquids, such as frozen ice pops. ? Gargle with a salt-water mixture 3-4 times a day or as needed. To make a salt-water mixture, add -1 tsp (3-6 g) of salt to 1 cup (237 mL) of warm water. Mix it until you cannot see the salt anymore. ? Suck on hard candy or throat lozenges. ? Put a cool-mist humidifier in your bedroom at night. ? Sit in the bathroom with the door closed for 5-10 minutes while you run hot water in the shower.  Do not use any products that contain nicotine or tobacco, such as cigarettes, e-cigarettes, and chewing tobacco. If you need help quitting, ask your doctor.  Wash your hands well and often with soap and water. If soap and water are not available, use hand sanitizer. Contact a doctor if:  You have a fever for more than 2-3 days.  You keep having  symptoms for more than 2-3 days.  Your throat does not get better in 7 days.  You have a fever and your symptoms suddenly get worse.  Your child who is 3 months to 3 years old has a temperature of 102.60F (39C) or higher. Get help right away if:  You have trouble breathing.  You cannot swallow fluids, soft foods, or your saliva.  You have swelling in your throat or neck that gets worse.  You keep feeling sick to your stomach (nauseous).  You keep throwing up (vomiting). Summary  A sore throat is pain, burning, irritation, or scratchiness in the throat. Many things can cause a sore throat.  Take over-the-counter medicines only as told by your doctor. Do not give your child aspirin.  Drink plenty of fluids, and rest as needed.  Contact a doctor if your symptoms get worse or your sore throat does not get better within 7 days. This information is not intended to replace advice given to you by your health care provider. Make sure you discuss any questions you have with your health care provider. Document Revised: 03/21/2018 Document Reviewed: 03/21/2018 Elsevier Patient Education  [place].   Postnasal Drip Postnasal drip is the feeling of mucus going down the back of your throat. Mucus is a slimy substance that moistens and cleans your nose and throat, as well  as the air pockets in face bones near your forehead and cheeks (sinuses). Small amounts of mucus pass from your nose and sinuses down the back of your throat all the time. This is normal. When you produce too much mucus or the mucus gets too thick, you can feel it. Some common causes of postnasal drip include:  Having more mucus because of: ? A cold or the flu. ? Allergies. ? Cold air. ? Certain medicines.  Having more mucus that is thicker because of: ? A sinus or nasal infection. ? Dry air. ? A food allergy. Follow these instructions at home: Relieving discomfort   Gargle with a salt-water mixture 3-4  times a day or as needed. To make a salt-water mixture, completely dissolve -1 tsp of salt in 1 cup of warm water.  If the air in your home is dry, use a humidifier to add moisture to the air.  Use a saline spray or container (neti pot) to flush out the nose (nasal irrigation). These methods can help clear away mucus and keep the nasal passages moist. General instructions  Take over-the-counter and prescription medicines only as told by your health care provider.  Follow instructions from your health care provider about eating or drinking restrictions. You may need to avoid caffeine.  Avoid things that you know you are allergic to (allergens), like dust, mold, pollen, pets, or certain foods.  Drink enough fluid to keep your urine pale yellow.  Keep all follow-up visits as told by your health care provider. This is important. Contact a health care provider if:  You have a fever.  You have a sore throat.  You have difficulty swallowing.  You have headache.  You have sinus pain.  You have a cough that does not go away.  The mucus from your nose becomes thick and is green or yellow in color.  You have cold or flu symptoms that last more than 10 days. Summary  Postnasal drip is the feeling of mucus going down the back of your throat.  If your health care provider approves, use nasal irrigation or a nasal spray 2?4 times a day.  Avoid things that you know you are allergic to (allergens), like dust, mold, pollen, pets, or certain foods. This information is not intended to replace advice given to you by your health care provider. Make sure you discuss any questions you have with your health care provider. Document Revised: 02/10/2019 Document Reviewed: 02/01/2017 Elsevier Patient Education  [place].

## 2020-01-24 NOTE — Telephone Encounter (Signed)
Have tried to call pt x 3. His VM is full. He is scheduled at the respiratory clinic tonight at 5:45pm at [facility]. (old [place] office) He should follow the signs and use the side entrance. I will continue to try and reach him.

## 2020-02-04 NOTE — Progress Notes (Deleted)
[place] at [facility] [address] [place], [place] [address] [phone]  Date:  02/05/2020   Name:  Stephen Castro   DOB:  30-Nov-1990   MRN:  [id number]  PCP:  Darreld Mclean, MD    Chief Complaint: No chief complaint on file.   History of Present Illness:  Stephen Castro is a 29 y.o. very pleasant male patient who presents with the following:  Here today for an in person visit to discuss swollen glands Kyre has history of hypertension, migraine headache, and incidentally discovered brain aneurysm versus infundibulum We have had a few virtual visits recently, on February 11, February 17, and March 10 We were not able to see him in the office due to his symptoms, I treated him first with penicillin and then with Carole Civil He was also seen at respiratory clinic on March 24 and was given prednisone 50 for 3 days He has been tested for COVID-19 and been negative   Would like to do a CBC and Epstein-Barr virus panel as these have not been done yet  His cardiologist is Dr. Alvester Chou, he has been worked up for secondary hypertension Negative for pheochromocytoma  He is currently treated with amlodipine 10 Chlorthalidone 25 Losartan 50 Ajovy injection once a month for migraine Patient Active Problem List   Diagnosis Date Noted  . Chronic migraine without aura without status migrainosus, not intractable 08/08/2019  . Hypertension 06/30/2019  . Brain aneurysm 06/17/2019  . Gastroenteritis 04/13/2019  . Shoulder injury, left, initial encounter 01/24/2018  . ADHD (attention deficit hyperactivity disorder) 04/04/2013  . Anxiety state, unspecified 04/04/2013    Past Medical History:  Diagnosis Date  . Aneurysm ([place]) 2020  . Anxiety    managed per pt  . Depression    "haven't really dealt with" in "quite some time"  . High blood pressure    slightly elevated    No past surgical history on file.  Social History    Tobacco Use  . Smoking status: Current Every Day Smoker    Packs/day: 0.50    Types: Cigarettes  . Smokeless tobacco: Never Used  . Tobacco comment: 08/08/2019 update, trying to cut back   Substance Use Topics  . Alcohol use: Yes    Comment: Stopped drinking a year and a half ago. update 08/08/2019 drinks "a little bit but not often"  . Drug use: No    Family History  Problem Relation Age of Onset  . Stroke Mother        carotid artery dissection during massage   . Aneurysm Paternal Grandmother   . Hypertension Other        both sides of family  . Lung cancer Other        both sides of family   . Cerebral aneurysm Maternal Uncle   . Migraines Neg Hx        none that he is aware of    No Known Allergies  Medication list has been reviewed and updated.  Current Outpatient Medications on File Prior to Visit  Medication Sig Dispense Refill  . albuterol (PROVENTIL HFA;VENTOLIN HFA) 108 (90 Base) MCG/ACT inhaler Inhale 2 puffs into the lungs every 6 (six) hours as needed for wheezing or shortness of breath. 1 Inhaler 0  . amLODipine (NORVASC) 10 MG tablet Take 1 tablet (10 mg total) by mouth daily. 30 tablet 3  . cefdinir (OMNICEF) 300 MG capsule Take 1  capsule (300 mg total) by mouth 2 (two) times daily. 20 capsule 0  . chlorthalidone (HYGROTON) 25 MG tablet Take 1 tablet (25 mg total) by mouth daily. 90 tablet 3  . cyclobenzaprine (FLEXERIL) 10 MG tablet Take 0.5-1 tablets (5-10 mg total) by mouth 3 (three) times daily as needed for muscle spasms. 60 tablet 3  . Fremanezumab-vfrm (AJOVY) 225 MG/1.5ML SOAJ Inject 225 mg into the skin every 30 (thirty) days. 1 pen 11  . ibuprofen (ADVIL) 200 MG tablet Take 400 mg by mouth every 6 (six) hours as needed for moderate pain.    Marland Kitchen lidocaine (XYLOCAINE) 2 % solution Use as directed 15 mLs in the mouth or throat as needed for mouth pain. 100 mL 0  . losartan (COZAAR) 50 MG tablet Take 1 tablet (50 mg total) by mouth daily.    .  methocarbamol (ROBAXIN) 500 MG tablet Take 1 tablet (500 mg total) by mouth every 8 (eight) hours as needed for muscle spasms. 30 tablet 0  . ondansetron (ZOFRAN ODT) 4 MG disintegrating tablet Take 1 tablet (4 mg total) by mouth every 8 (eight) hours as needed for nausea or vomiting. 20 tablet 0  . penicillin v potassium (VEETID) 500 MG tablet Take 1 tablet (500 mg total) by mouth 2 (two) times daily. 20 tablet 0   No current facility-administered medications on file prior to visit.    Review of Systems:  As per HPI- otherwise negative.   Physical Examination: There were no vitals filed for this visit. There were no vitals filed for this visit. There is no height or weight on file to calculate BMI. Ideal Body Weight:    GEN: no acute distress. HEENT: Atraumatic, Normocephalic.  Ears and Nose: No external deformity. CV: RRR, No M/G/R. No JVD. No thrill. No extra heart sounds. PULM: CTA B, no wheezes, crackles, rhonchi. No retractions. No resp. distress. No accessory muscle use. ABD: S, NT, ND, +BS. No rebound. No HSM. EXTR: No c/c/e PSYCH: Normally interactive. Conversant.    Assessment and Plan: *** This visit occurred during the SARS-CoV-2 public health emergency.  Safety protocols were in place, including screening questions prior to the visit, additional usage of staff PPE, and extensive cleaning of exam room while observing appropriate contact time as indicated for disinfecting solutions.   Moderate medical decision making today Signed Lamar Blinks, MD

## 2020-02-05 ENCOUNTER — Ambulatory Visit: Payer: BC Managed Care – PPO | Admitting: Family Medicine

## 2020-02-05 DIAGNOSIS — Z0289 Encounter for other administrative examinations: Secondary | ICD-10-CM

## 2020-02-17 DIAGNOSIS — Z03818 Encounter for observation for suspected exposure to other biological agents ruled out: Secondary | ICD-10-CM | POA: Diagnosis not present

## 2020-02-17 DIAGNOSIS — Z20828 Contact with and (suspected) exposure to other viral communicable diseases: Secondary | ICD-10-CM | POA: Diagnosis not present

## 2020-03-18 DIAGNOSIS — M9903 Segmental and somatic dysfunction of lumbar region: Secondary | ICD-10-CM | POA: Diagnosis not present

## 2020-03-18 DIAGNOSIS — M545 Low back pain: Secondary | ICD-10-CM | POA: Diagnosis not present

## 2020-03-18 DIAGNOSIS — G44201 Tension-type headache, unspecified, intractable: Secondary | ICD-10-CM | POA: Diagnosis not present

## 2020-03-18 DIAGNOSIS — M542 Cervicalgia: Secondary | ICD-10-CM | POA: Diagnosis not present

## 2020-03-20 DIAGNOSIS — M9903 Segmental and somatic dysfunction of lumbar region: Secondary | ICD-10-CM | POA: Diagnosis not present

## 2020-03-20 DIAGNOSIS — M545 Low back pain: Secondary | ICD-10-CM | POA: Diagnosis not present

## 2020-03-20 DIAGNOSIS — M542 Cervicalgia: Secondary | ICD-10-CM | POA: Diagnosis not present

## 2020-03-20 DIAGNOSIS — G44201 Tension-type headache, unspecified, intractable: Secondary | ICD-10-CM | POA: Diagnosis not present

## 2020-03-25 DIAGNOSIS — G44201 Tension-type headache, unspecified, intractable: Secondary | ICD-10-CM | POA: Diagnosis not present

## 2020-03-25 DIAGNOSIS — M545 Low back pain: Secondary | ICD-10-CM | POA: Diagnosis not present

## 2020-03-25 DIAGNOSIS — M542 Cervicalgia: Secondary | ICD-10-CM | POA: Diagnosis not present

## 2020-03-25 DIAGNOSIS — M9903 Segmental and somatic dysfunction of lumbar region: Secondary | ICD-10-CM | POA: Diagnosis not present

## 2020-03-26 ENCOUNTER — Encounter: Payer: Self-pay | Admitting: Medical

## 2020-03-26 ENCOUNTER — Other Ambulatory Visit: Payer: Self-pay

## 2020-03-26 ENCOUNTER — Telehealth: Payer: Self-pay | Admitting: Family Medicine

## 2020-03-26 ENCOUNTER — Telehealth (INDEPENDENT_AMBULATORY_CARE_PROVIDER_SITE_OTHER): Payer: BC Managed Care – PPO | Admitting: Medical

## 2020-03-26 VITALS — BP 132/86 | HR 53 | Temp 97.7°F

## 2020-03-26 DIAGNOSIS — Z9109 Other allergy status, other than to drugs and biological substances: Secondary | ICD-10-CM | POA: Diagnosis not present

## 2020-03-26 DIAGNOSIS — J301 Allergic rhinitis due to pollen: Secondary | ICD-10-CM | POA: Diagnosis not present

## 2020-03-26 DIAGNOSIS — J40 Bronchitis, not specified as acute or chronic: Secondary | ICD-10-CM

## 2020-03-26 DIAGNOSIS — J329 Chronic sinusitis, unspecified: Secondary | ICD-10-CM | POA: Diagnosis not present

## 2020-03-26 MED ORDER — BENZONATATE 100 MG PO CAPS: 100.0000 mg | ORAL_CAPSULE | Freq: Three times a day (TID) | ORAL | 0 refills | Status: DC | PRN

## 2020-03-26 MED ORDER — FLUTICASONE PROPIONATE 50 MCG/ACT NA SUSP: 2.0000 | Freq: Every day | NASAL | 1 refills | Status: DC

## 2020-03-26 MED ORDER — ALBUTEROL SULFATE HFA 108 (90 BASE) MCG/ACT IN AERS: 2.0000 | INHALATION_SPRAY | Freq: Four times a day (QID) | RESPIRATORY_TRACT | 0 refills | Status: DC | PRN

## 2020-03-26 MED ORDER — MONTELUKAST SODIUM 10 MG PO TABS: 10.0000 mg | ORAL_TABLET | Freq: Every day | ORAL | 3 refills | Status: DC

## 2020-03-26 MED ORDER — DOXYCYCLINE HYCLATE 100 MG PO TABS: 100.0000 mg | ORAL_TABLET | Freq: Two times a day (BID) | ORAL | 0 refills | Status: DC

## 2020-03-26 NOTE — Patient Instructions (Addendum)
You do have allergy type signs/symptoms that began 10 days ago. Will advise continue zyrtec, rx flonase and add montelukast.   For bronchitis and possible sinus infection rx doxycycline. Rx advisement.  For cough, rx benzonatate.  For wheezing rx albuterol inhaler.  If signs/symptoms not improving notify us and would get cxr.  Follow up 7 days or as needed

## 2020-03-26 NOTE — Telephone Encounter (Signed)
Patient states dealing with sinus congestion and allergies for two weeks now. Patient would like to advisement on what to do.

## 2020-03-26 NOTE — Telephone Encounter (Signed)
Sent patient message. Patient needs appt.

## 2020-03-26 NOTE — Progress Notes (Signed)
   Subjective:    Patient ID: Stephen Castro, male    DOB: Sep 23, 1991, 29 y.o.   MRN: [id number]  HPI Virtual Visit via Video Note  I connected with [name] on 03/26/20 at  4:00 PM EDT by a video enabled telemedicine application and verified that I am speaking with the correct person using two identifiers.  Location: Patient: home Provider: office   I discussed the limitations of evaluation and management by telemedicine and the availability of in person appointments. The patient expressed understanding and agreed to proceed.  History of Present Illness: Pt states for about 10 days has been sick. He had nasal congestion, sinus pressure, pnd and some cough.   Pt states his family got covid 1 month. Wife and son got covid. Pt states one month ago had nausea, lethargy, sneezing and skin pain. His family had similar symptoms. His wife and son tested +. He never got tested but he got sick at the same time. He has 600 square feet.  He got better then 10 days ago symptoms like allergies.   He tried zyrtec d then stopped that and just used plane zyrtec.  He is bringing up thick colored mucus when he coughs. Some frontal sinus pressure today. He feels chest congestion. Some mild wheeze.  Pt does smoke. No hx of asthma. One time used inhaler in his life. He smokes half a pack a day.     Observations/Objective: General-no acute distress, pleasant, oriented.coughs during the exam. Lungs- on inspection lungs appear unlabored. Neck- no tracheal deviation or jvd on inspection. Neuro- gross motor function appears intact. heent- frontal sinus pressure to palpation.   Assessment and Plan: You do have allergy type signs/symptoms that began 10 days ago. Will advise continue zyrtec, rx flonase and add montelukast.   For bronchitis and possible sinus infection rx doxycycline. Rx advisement.  For cough, rx benzonatate.  For wheezing rx albuterol inhaler.  If signs/symptoms not  improving notify us and would get cxr.  Follow up 7 days or as needed  Mackie Pai, PA-C   Time spent with patient today was 20  minutes which consisted of chart review, discussing diagnosis, treatment and documentation.  Follow Up Instructions:    I discussed the assessment and treatment plan with the patient. The patient was provided an opportunity to ask questions and all were answered. The patient agreed with the plan and demonstrated an understanding of the instructions.   The patient was advised to call back or seek an in-person evaluation if the symptoms worsen or if the condition fails to improve as anticipated.     Mackie Pai, PA-C   Review of Systems     Objective:   Physical Exam        Assessment & Plan:

## 2020-04-03 DIAGNOSIS — M9903 Segmental and somatic dysfunction of lumbar region: Secondary | ICD-10-CM | POA: Diagnosis not present

## 2020-04-03 DIAGNOSIS — G44201 Tension-type headache, unspecified, intractable: Secondary | ICD-10-CM | POA: Diagnosis not present

## 2020-04-03 DIAGNOSIS — M542 Cervicalgia: Secondary | ICD-10-CM | POA: Diagnosis not present

## 2020-04-03 DIAGNOSIS — M545 Low back pain: Secondary | ICD-10-CM | POA: Diagnosis not present

## 2020-04-04 ENCOUNTER — Telehealth: Payer: Self-pay | Admitting: Family Medicine

## 2020-04-04 DIAGNOSIS — G44201 Tension-type headache, unspecified, intractable: Secondary | ICD-10-CM | POA: Diagnosis not present

## 2020-04-04 DIAGNOSIS — M545 Low back pain: Secondary | ICD-10-CM | POA: Diagnosis not present

## 2020-04-04 DIAGNOSIS — M9903 Segmental and somatic dysfunction of lumbar region: Secondary | ICD-10-CM | POA: Diagnosis not present

## 2020-04-04 DIAGNOSIS — M542 Cervicalgia: Secondary | ICD-10-CM | POA: Diagnosis not present

## 2020-04-04 NOTE — Telephone Encounter (Signed)
Caller: Stephen Castro Call back phone number: [phone]   Patient is asking if there is any possible way you could give him a note. Patient states the only way he could continue to work is if he wears a mask, but do with his condition he is unable to wear it without having difficulty breathing. Patient states he will be done with antibiotic on Wednesday. Can you give him a note for work till Wednesday?  Please call patient when note is ready.

## 2020-04-04 NOTE — Telephone Encounter (Signed)
Patient notified note is ready. Placed up front for pick up.

## 2020-04-04 NOTE — Telephone Encounter (Signed)
please give him a call- if he is acutely  or severely ill with fever then defer covid vaccine until feeling better.  However otherwise ok to go ahead and get vaccine  JC

## 2020-04-04 NOTE — Telephone Encounter (Signed)
Caller: [name] Call Back # [phone]  Patient states that his job is requiring him to get Covid Vaccine, Patient wonders if he should take vaccine ? Patient has a sinus infection .   Please Advise

## 2020-04-05 DIAGNOSIS — M9903 Segmental and somatic dysfunction of lumbar region: Secondary | ICD-10-CM | POA: Diagnosis not present

## 2020-04-05 DIAGNOSIS — M542 Cervicalgia: Secondary | ICD-10-CM | POA: Diagnosis not present

## 2020-04-05 DIAGNOSIS — M545 Low back pain: Secondary | ICD-10-CM | POA: Diagnosis not present

## 2020-04-05 DIAGNOSIS — G44201 Tension-type headache, unspecified, intractable: Secondary | ICD-10-CM | POA: Diagnosis not present

## 2020-04-10 DIAGNOSIS — G44201 Tension-type headache, unspecified, intractable: Secondary | ICD-10-CM | POA: Diagnosis not present

## 2020-04-10 DIAGNOSIS — M545 Low back pain: Secondary | ICD-10-CM | POA: Diagnosis not present

## 2020-04-10 DIAGNOSIS — M9903 Segmental and somatic dysfunction of lumbar region: Secondary | ICD-10-CM | POA: Diagnosis not present

## 2020-04-10 DIAGNOSIS — M542 Cervicalgia: Secondary | ICD-10-CM | POA: Diagnosis not present

## 2020-04-16 ENCOUNTER — Telehealth: Payer: Self-pay | Admitting: Family Medicine

## 2020-04-16 NOTE — Telephone Encounter (Signed)
Caller Levada Dy Call Back # [phone]    Per patient's mother, patient has been sick with upper respiratory issues for almost and year. Patient has been covid tested multiple times. Patient has had multiple mychart /vitural visits no improvement. Per patient's mother she would like son to be seen in office sometime this week. Patient has now lost his voice.   Please Advise

## 2020-04-16 NOTE — Telephone Encounter (Signed)
Patient mother is calling back to check the status of her previous message. She would like a phone call back at [phone]

## 2020-04-17 ENCOUNTER — Telehealth: Payer: Self-pay | Admitting: Family Medicine

## 2020-04-17 NOTE — Telephone Encounter (Signed)
error 

## 2020-04-18 ENCOUNTER — Ambulatory Visit (HOSPITAL_BASED_OUTPATIENT_CLINIC_OR_DEPARTMENT_OTHER)
Admission: RE | Admit: 2020-04-18 | Discharge: 2020-04-18 | Disposition: A | Discharge status: Home / Self Care | Payer: BC Managed Care – PPO | Source: Ambulatory Visit | Attending: Medical | Admitting: Medical

## 2020-04-18 ENCOUNTER — Ambulatory Visit (INDEPENDENT_AMBULATORY_CARE_PROVIDER_SITE_OTHER): Payer: BC Managed Care – PPO | Admitting: Medical

## 2020-04-18 ENCOUNTER — Encounter: Payer: Self-pay | Admitting: Medical

## 2020-04-18 ENCOUNTER — Other Ambulatory Visit: Payer: Self-pay

## 2020-04-18 VITALS — BP 132/80 | HR 58 | Temp 98.2°F | Resp 18 | Ht 70.0 in | Wt 167.6 lb

## 2020-04-18 DIAGNOSIS — J40 Bronchitis, not specified as acute or chronic: Secondary | ICD-10-CM

## 2020-04-18 DIAGNOSIS — F17200 Nicotine dependence, unspecified, uncomplicated: Secondary | ICD-10-CM

## 2020-04-18 DIAGNOSIS — R05 Cough: Secondary | ICD-10-CM | POA: Diagnosis not present

## 2020-04-18 DIAGNOSIS — J3489 Other specified disorders of nose and nasal sinuses: Secondary | ICD-10-CM

## 2020-04-18 DIAGNOSIS — R059 Cough, unspecified: Secondary | ICD-10-CM

## 2020-04-18 DIAGNOSIS — J984 Other disorders of lung: Secondary | ICD-10-CM | POA: Diagnosis not present

## 2020-04-18 DIAGNOSIS — J301 Allergic rhinitis due to pollen: Secondary | ICD-10-CM | POA: Diagnosis not present

## 2020-04-18 DIAGNOSIS — R062 Wheezing: Secondary | ICD-10-CM | POA: Diagnosis not present

## 2020-04-18 MED ORDER — BECLOMETHASONE DIPROPIONATE 40 MCG/ACT IN AERS: 2.0000 | INHALATION_SPRAY | Freq: Two times a day (BID) | RESPIRATORY_TRACT | 12 refills | Status: DC

## 2020-04-18 MED ORDER — AZELASTINE HCL 0.1 % NA SOLN: 2.0000 | Freq: Two times a day (BID) | NASAL | 12 refills | Status: DC

## 2020-04-18 MED ORDER — AZITHROMYCIN 250 MG PO TABS: ORAL_TABLET | ORAL | 0 refills | Status: DC

## 2020-04-18 MED ORDER — NICOTINE 21 MG/24HR TD PT24: 21.0000 mg | MEDICATED_PATCH | Freq: Every day | TRANSDERMAL | 0 refills | Status: DC

## 2020-04-18 MED ORDER — LEVOCETIRIZINE DIHYDROCHLORIDE 5 MG PO TABS: 5.0000 mg | ORAL_TABLET | Freq: Every evening | ORAL | 3 refills | Status: DC

## 2020-04-18 MED ORDER — ALBUTEROL SULFATE HFA 108 (90 BASE) MCG/ACT IN AERS: 2.0000 | INHALATION_SPRAY | Freq: Four times a day (QID) | RESPIRATORY_TRACT | 0 refills | Status: DC | PRN

## 2020-04-18 NOTE — Patient Instructions (Signed)
You do have some symptoms that indicate allergic rhinitis but considering component of vasomotor rhinitis as well.  Prescription of Xyzal antihistamine, Astelin nasal spray and montelukast sent to pharmacy today.  Concern for recurrence possible sinus infection and chronic bronchitis.  Prescription of azithromycin antibiotic sent to your pharmacy.  I do want you to get chest x-ray today.  Also respiratory wheezing on lung exam.  Will prescribe Qvar inhaler and refill your albuterol.  Taper dose of prednisone might be beneficial but you report side effects with prednisone.  Description of being agitated and being mean while on medication.  We will see how he responds with above.  Low-dose Medrol dose pack taper might be an option if you do not respond to the above treatment.  Do think that stopping smoking would be beneficial for future health as well as for the recent signs/symptoms.  Cannot tolerate Chantix and side effects with Wellbutrin.  So prescribed nicotine patches.  Follow-up in 2 to 4 weeks or as needed.  If signs symptoms will improve might need to refer to specialist.

## 2020-04-18 NOTE — Progress Notes (Signed)
Subjective:    Patient ID: Stephen Castro, male    DOB: [birth year], 29 y.o.   MRN: [id number]  HPI  Pt has 3 weeks of nasal congestion, pnd, sneezing, yellow mucus from nose and productive cough at times.  No fever, no chills, no sweats and can smell/taste food.  Pt does smoke 1/2 pack a day. Pt has smoked since 29 yo. Has quit couple of time in past for about 2 months. Failed chantix.hx of depression in the past. He used to be on wellbutrin.   3 weeks ago approximately had allergies, sinus infection and bronchitis. Used zyrtec, flonase, montelukast, benzonate, and doxycycline.   Pt states he got better with tx. Then over a week or so go recurrent symptoms.   Pt states did use albuterol couple of times.  Pt states he did not do well with prednisone in the past. Made him mean.    Review of Systems  Constitutional: Negative for chills, fatigue and fever.  HENT: Positive for congestion, postnasal drip, sinus pressure, sinus pain and sore throat. Negative for ear pain.   Respiratory: Positive for cough. Negative for shortness of breath and wheezing.   Cardiovascular: Negative for chest pain and palpitations.  Gastrointestinal: Negative for abdominal pain.  Musculoskeletal: Negative for back pain.  Skin: Negative for rash.  Hematological: Negative for adenopathy. Does not bruise/bleed easily.    Past Medical History:  Diagnosis Date  . Aneurysm ([place]) 2020  . Anxiety    managed per pt  . Depression    "haven't really dealt with" in "quite some time"  . High blood pressure    slightly elevated     Social History   Socioeconomic History  . Marital status: Significant Other    Spouse name: Not on file  . Number of children: Not on file  . Years of education: Not on file  . Highest education level: Some college, no degree  Occupational History  . Not on file  Tobacco Use  . Smoking status: Current Every Day Smoker    Packs/day: 0.50    Types: Cigarettes  . Smokeless  tobacco: Never Used  . Tobacco comment: 08/08/2019 update, trying to cut back   Vaping Use  . Vaping Use: Former  Substance and Sexual Activity  . Alcohol use: Yes    Comment: Stopped drinking a year and a half ago. update 08/08/2019 drinks "a little bit but not often"  . Drug use: No  . Sexual activity: Yes    Birth control/protection: Condom  Other Topics Concern  . Not on file  Social History Narrative   Lives at home with his fiance [name]   Right handed   Caffeine: rarely a red bull, not daily. Coffee some, not daily.   Social Determinants of Health   Financial Resource Strain:   . Difficulty of Paying Living Expenses:   Food Insecurity:   . Worried About Charity fundraiser in the Last Year:   . Arboriculturist in the Last Year:   Transportation Needs:   . Film/video editor (Medical):   Marland Kitchen Lack of Transportation (Non-Medical):   Physical Activity:   . Days of Exercise per Week:   . Minutes of Exercise per Session:   Stress:   . Feeling of Stress :   Social Connections:   . Frequency of Communication with Friends and Family:   . Frequency of Social Gatherings with Friends and Family:   . Attends Religious Services:   .  Active Member of Clubs or Organizations:   . Attends Archivist Meetings:   Marland Kitchen Marital Status:   Intimate Partner Violence:   . Fear of Current or Ex-Partner:   . Emotionally Abused:   Marland Kitchen Physically Abused:   . Sexually Abused:     No past surgical history on file.  Family History  Problem Relation Age of Onset  . Stroke Mother        carotid artery dissection during massage   . Aneurysm Paternal Grandmother   . Hypertension Other        both sides of family  . Lung cancer Other        both sides of family   . Cerebral aneurysm Maternal Uncle   . Migraines Neg Hx        none that he is aware of    No Known Allergies  Current Outpatient Medications on File Prior to Visit  Medication Sig Dispense Refill  . albuterol  (PROVENTIL HFA;VENTOLIN HFA) 108 (90 Base) MCG/ACT inhaler Inhale 2 puffs into the lungs every 6 (six) hours as needed for wheezing or shortness of breath. 1 Inhaler 0  . albuterol (VENTOLIN HFA) 108 (90 Base) MCG/ACT inhaler Inhale 2 puffs into the lungs every 6 (six) hours as needed. 18 g 0  . cyclobenzaprine (FLEXERIL) 10 MG tablet Take 0.5-1 tablets (5-10 mg total) by mouth 3 (three) times daily as needed for muscle spasms. 60 tablet 3  . fluticasone (FLONASE) 50 MCG/ACT nasal spray Place 2 sprays into both nostrils daily. 16 g 1  . Fremanezumab-vfrm (AJOVY) 225 MG/1.5ML SOAJ Inject 225 mg into the skin every 30 (thirty) days. 1 pen 11  . ibuprofen (ADVIL) 200 MG tablet Take 400 mg by mouth every 6 (six) hours as needed for moderate pain.    Marland Kitchen lidocaine (XYLOCAINE) 2 % solution Use as directed 15 mLs in the mouth or throat as needed for mouth pain. 100 mL 0  . losartan (COZAAR) 50 MG tablet Take 1 tablet (50 mg total) by mouth daily.    . methocarbamol (ROBAXIN) 500 MG tablet Take 1 tablet (500 mg total) by mouth every 8 (eight) hours as needed for muscle spasms. 30 tablet 0  . montelukast (SINGULAIR) 10 MG tablet Take 1 tablet (10 mg total) by mouth at bedtime. 30 tablet 3  . ondansetron (ZOFRAN ODT) 4 MG disintegrating tablet Take 1 tablet (4 mg total) by mouth every 8 (eight) hours as needed for nausea or vomiting. 20 tablet 0  . amLODipine (NORVASC) 10 MG tablet Take 1 tablet (10 mg total) by mouth daily. (Patient not taking: Reported on 04/18/2020) 30 tablet 3  . benzonatate (TESSALON) 100 MG capsule Take 1 capsule (100 mg total) by mouth 3 (three) times daily as needed for cough. (Patient not taking: Reported on 04/18/2020) 30 capsule 0  . chlorthalidone (HYGROTON) 25 MG tablet Take 1 tablet (25 mg total) by mouth daily. 90 tablet 3  . doxycycline (VIBRA-TABS) 100 MG tablet Take 1 tablet (100 mg total) by mouth 2 (two) times daily. (Patient not taking: Reported on 04/18/2020) 20 tablet 0   No  current facility-administered medications on file prior to visit.    BP 132/80 (BP Location: Right Arm, Patient Position: Sitting, Cuff Size: Large)   Pulse (!) 58   Temp 98.2 F (36.8 C) (Temporal)   Resp 18   Ht 5\' 10"  (1.778 m)   Wt 167 lb 9.6 oz (76 kg)   SpO2 96%  BMI 24.05 kg/m       Objective:   Physical Exam  General  Mental Status - Alert. General Appearance - Well groomed. Not in acute distress.  Skin Rashes- No Rashes.  HEENT Head- Normal. Ear Auditory Canal - Left- Normal. Right - Normal.Tympanic Membrane- Left- Normal. Right- Normal. Eye Sclera/Conjunctiva- Left- Normal. Right- Normal. Nose & Sinuses Nasal Mucosa- Left-  Boggy and Congested. Right-  Boggy and  Congested.Bilateral no maxillary and no  frontal sinus pressure. Mouth & Throat Lips: Upper Lip- Normal: no dryness, cracking, pallor, cyanosis, or vesicular eruption. Lower Lip-Normal: no dryness, cracking, pallor, cyanosis or vesicular eruption. Buccal Mucosa- Bilateral- No Aphthous ulcers. Oropharynx- No Discharge or Erythema. Tonsils: Characteristics- Bilateral- No Erythema or Congestion. Size/Enlargement- Bilateral- No enlargement. Discharge- bilateral-None.  Neck Neck- Supple. No Masses.   Chest and Lung Exam Auscultation: Breath Sounds:- even and unlabored.mild shallow. Faint expiratory wheeze at base.  Cardiovascular Auscultation:Rythm- Regular, rate and rhythm. Murmurs & Other Heart Sounds:Ausculatation of the heart reveal- No Murmurs.  Lymphatic Head & Neck General Head & Neck Lymphatics: Bilateral: Description- mild minimal/faint rt submandibular node swollen  Mouth- 2 teeth lower jaw rt side look little discolored.      Assessment & Plan:  You do have some symptoms that indicate allergic rhinitis but considering component of vasomotor rhinitis as well.  Prescription of Xyzal antihistamine, Astelin nasal spray and montelukast sent to pharmacy today.  Concern for recurrence  possible sinus infection and chronic bronchitis.  Prescription of azithromycin antibiotic sent to your pharmacy.  I do want you to get chest x-ray today.  Also respiratory wheezing on lung exam.  Will prescribe Qvar inhaler and refill your albuterol.  Taper dose of prednisone might be beneficial but you report side effects with prednisone.  Description of being agitated and being mean while on medication.  We will see how he responds with above.  Low-dose Medrol dose pack taper might be an option if you do not respond to the above treatment.  Do think that stopping smoking would be beneficial for future health as well as for the recent signs/symptoms.  Cannot tolerate Chantix and side effects with Wellbutrin.  So prescribed nicotine patches.  Follow-up in 2 to 4 weeks or as needed.  If signs symptoms will improve might need to refer to specialist.   Mackie Pai, PA-C   Time spent with patient today was 25  minutes which consisted of chart review, discussing diagnosees, work up treatment and documentation.

## 2020-04-22 ENCOUNTER — Ambulatory Visit: Payer: BC Managed Care – PPO | Admitting: Family Medicine

## 2020-04-22 ENCOUNTER — Other Ambulatory Visit: Payer: Self-pay

## 2020-04-22 ENCOUNTER — Other Ambulatory Visit: Payer: Self-pay | Admitting: Family Medicine

## 2020-04-22 ENCOUNTER — Ambulatory Visit (HOSPITAL_BASED_OUTPATIENT_CLINIC_OR_DEPARTMENT_OTHER)
Admission: RE | Admit: 2020-04-22 | Discharge: 2020-04-22 | Disposition: A | Discharge status: Home / Self Care | Payer: BC Managed Care – PPO | Source: Ambulatory Visit | Attending: Family Medicine | Admitting: Family Medicine

## 2020-04-22 ENCOUNTER — Encounter: Payer: Self-pay | Admitting: Family Medicine

## 2020-04-22 VITALS — BP 140/100 | HR 83 | Temp 97.7°F | Resp 18 | Ht 70.0 in | Wt 167.2 lb

## 2020-04-22 DIAGNOSIS — M542 Cervicalgia: Secondary | ICD-10-CM | POA: Diagnosis not present

## 2020-04-22 DIAGNOSIS — M545 Low back pain: Secondary | ICD-10-CM | POA: Diagnosis not present

## 2020-04-22 DIAGNOSIS — M9903 Segmental and somatic dysfunction of lumbar region: Secondary | ICD-10-CM | POA: Diagnosis not present

## 2020-04-22 DIAGNOSIS — G44201 Tension-type headache, unspecified, intractable: Secondary | ICD-10-CM | POA: Diagnosis not present

## 2020-04-22 MED ORDER — CYCLOBENZAPRINE HCL 10 MG PO TABS: 10.0000 mg | ORAL_TABLET | Freq: Two times a day (BID) | ORAL | 0 refills | Status: DC | PRN

## 2020-04-22 MED ORDER — HYDROCODONE-ACETAMINOPHEN 5-325 MG PO TABS: 1.0000 | ORAL_TABLET | Freq: Three times a day (TID) | ORAL | 0 refills | Status: DC | PRN

## 2020-04-22 NOTE — Patient Instructions (Addendum)
It was good to see you again today, but I am sorry that you got hurt  We will get a CT of your neck today to make sure there is no urgent injury Assuming your CT is okay, you likely have muscle spasm and possibly a sprain We will use hydrocodone and/or Flexeril as needed for pain.  Be cautious in using these medications as they can cause sedation.  Do not drive or use alcohol with these medications.  Heat and/or ice, and a foam cervical collar may also be helpful  Please let us know if you are not feeling better in the next couple of days- Sooner if worse.  Rash

## 2020-04-22 NOTE — Progress Notes (Signed)
[place] at [facility] [address], [place], [place] [address] [phone]  Date:  04/22/2020   Name:  Stephen Castro   DOB:  1991/01/04   MRN:  [id number]  PCP:  Darreld Mclean, MD    Chief Complaint: Neck Pain (Pt states having neck pain for the last couple of days. Pt states he fell in the shower this morning. Pt states he fell in a push up position. Pt states he did not hit his head. Pt states he was having pain prior to fall. )   History of Present Illness:  Stephen Castro is a 29 y.o. very pleasant male patient who presents with the following:  Patient with history of migraine headache, hypertension, incidentally noted possible brain aneurysm, anxiety  Here today with concern of neck pain  I saw him early in February with sore throat and cervical lymphadenopathy, treated with penicillin We had another visit on March 10 at which time he had sore throat, cough and chills.  I encouraged him to be tested for COVID-19 and prescribed Omnicef  He then was seen at the [facility] respiratory clinic on March 24 for the same concerns He was given a short course of prednisone for cervical adenitis, not thought to be a bacterial infection  He then did a video visit with my partner [name] on May 25 with concern of allergy type symptoms He was treated with Flonase and Singulair, continue Zyrtec.  He also was given doxycycline for possible bronchitis/sinusitis, Tessalon Perles and albuterol  He was then seen in person last week on 6/17 - he was treated with zpack, qvar and albuterol He did get a chest x-ray 04/18/20 CHEST - 2 VIEW COMPARISON:  12/14/2019  FINDINGS: The heart size and mediastinal contours are within normal limits. Both lungs are clear. The visualized skeletal structures are unremarkable. IMPRESSION: No active cardiopulmonary disease.  Most recent labs on chart from about 1 year ago He has been tested for COVID-19  several times have been negative-most recently June 12  He is seeing Dr Gwenlyn Found for hypertension, thought to have a possible secondary cause due to age and lack of risk factors He has been worked up for pheochromocytoma which was negative Current blood pressure medications are losartan 50, chlorthalidone recently increased to 25 mg  Per last note from Dr Gwenlyn Found There is a family history of cerebral aneurysms. He has had new onset headaches and hypertension over the last several weeks to months. 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 Leonie Man. He was placed on amlodipine and losartan which has been titrated with improvement in his blood pressures and headaches. Renal Dopplers were performed that did not show evidence of renal artery stenosis.A 2D echocardiogram performed 05/23/2019 showed normal LV size and function with mild concentric LVH.   Also seen by Dr Jaynee Eagles in the fall for headaches:  1. Chronic migraine without aura without status migrainosus, not intractable   2. Acute non intractable tension-type headache    diagnosis and different diagnostic and therapeutic options, counseling and coordination of care, risks ans benefits of management, compliance, or risk factor reduction and education.   BP Readings from Last 3 Encounters:  04/22/20 (!) 140/100  04/18/20 132/80  03/26/20 132/86   Stephen Castro is here today with neck pain and possible injury He has noted neck pain for the last 2 days- just strated out of the blue  without any inciting injury He then slipped and fell in the shower today- he was able to catch himself in a "push-up" position and did not actually hit his head or neck.  However, this fall was quite jarring He had immediate pain in his neck The pain is going into his left arm and he sometimes has tingling in his left hand and fingers They tried some robaxin that they had on hand which did not really help  He went to his chiropractor today  and they did a tens unit and ice for him Pain seems of gotten worse during the day  Admits he did not take his blood pressure medication today which may be why his pressure is elevated  Patient Active Problem List   Diagnosis Date Noted  . Chronic migraine without aura without status migrainosus, not intractable 08/08/2019  . Hypertension 06/30/2019  . Brain aneurysm 06/17/2019  . Gastroenteritis 04/13/2019  . Shoulder injury, left, initial encounter 01/24/2018  . ADHD (attention deficit hyperactivity disorder) 04/04/2013  . Anxiety state, unspecified 04/04/2013    Past Medical History:  Diagnosis Date  . Aneurysm ([place]) 2020  . Anxiety    managed per pt  . Depression    "haven't really dealt with" in "quite some time"  . High blood pressure    slightly elevated    No past surgical history on file.  Social History   Tobacco Use  . Smoking status: Current Every Day Smoker    Packs/day: 0.50    Types: Cigarettes  . Smokeless tobacco: Never Used  . Tobacco comment: 08/08/2019 update, trying to cut back   Vaping Use  . Vaping Use: Former  Substance Use Topics  . Alcohol use: Yes    Comment: Stopped drinking a year and a half ago. update 08/08/2019 drinks "a little bit but not often"  . Drug use: No    Family History  Problem Relation Age of Onset  . Stroke Mother        carotid artery dissection during massage   . Aneurysm Paternal Grandmother   . Hypertension Other        both sides of family  . Lung cancer Other        both sides of family   . Cerebral aneurysm Maternal Uncle   . Migraines Neg Hx        none that he is aware of    No Known Allergies  Medication list has been reviewed and updated.  Current Outpatient Medications on File Prior to Visit  Medication Sig Dispense Refill  . albuterol (PROVENTIL HFA;VENTOLIN HFA) 108 (90 Base) MCG/ACT inhaler Inhale 2 puffs into the lungs every 6 (six) hours as needed for wheezing or shortness of breath. 1 Inhaler  0  . albuterol (VENTOLIN HFA) 108 (90 Base) MCG/ACT inhaler Inhale 2 puffs into the lungs every 6 (six) hours as needed. 18 g 0  . azelastine (ASTELIN) 0.1 % nasal spray Place 2 sprays into both nostrils 2 (two) times daily. Use in each nostril as directed 30 mL 12  . azithromycin (ZITHROMAX) 250 MG tablet Take 2 tablets by mouth on day 1, followed by 1 tablet by mouth daily for 4 days. 6 tablet 0  . beclomethasone (QVAR) 40 MCG/ACT inhaler Inhale 2 puffs into the lungs 2 (two) times daily. 1 Inhaler 12  . cyclobenzaprine (FLEXERIL) 10 MG tablet Take 0.5-1 tablets (5-10 mg total) by mouth 3 (three) times daily as needed for muscle spasms. 60 tablet  3  . fluticasone (FLONASE) 50 MCG/ACT nasal spray Place 2 sprays into both nostrils daily. 16 g 1  . Fremanezumab-vfrm (AJOVY) 225 MG/1.5ML SOAJ Inject 225 mg into the skin every 30 (thirty) days. 1 pen 11  . ibuprofen (ADVIL) 200 MG tablet Take 400 mg by mouth every 6 (six) hours as needed for moderate pain.    Marland Kitchen levocetirizine (XYZAL) 5 MG tablet Take 1 tablet (5 mg total) by mouth every evening. 30 tablet 3  . lidocaine (XYLOCAINE) 2 % solution Use as directed 15 mLs in the mouth or throat as needed for mouth pain. 100 mL 0  . losartan (COZAAR) 50 MG tablet Take 1 tablet (50 mg total) by mouth daily.    . methocarbamol (ROBAXIN) 500 MG tablet Take 1 tablet (500 mg total) by mouth every 8 (eight) hours as needed for muscle spasms. 30 tablet 0  . montelukast (SINGULAIR) 10 MG tablet Take 1 tablet (10 mg total) by mouth at bedtime. 30 tablet 3  . nicotine (NICODERM CQ - DOSED IN MG/24 HOURS) 21 mg/24hr patch Place 1 patch (21 mg total) onto the skin daily. 28 patch 0  . ondansetron (ZOFRAN ODT) 4 MG disintegrating tablet Take 1 tablet (4 mg total) by mouth every 8 (eight) hours as needed for nausea or vomiting. 20 tablet 0  . chlorthalidone (HYGROTON) 25 MG tablet Take 1 tablet (25 mg total) by mouth daily. 90 tablet 3   No current facility-administered  medications on file prior to visit.    Review of Systems:  As per HPI- otherwise negative. He did not take his BP meds today    Physical Examination: Vitals:   04/22/20 1557  BP: (!) 140/100  Pulse: 83  Resp: 18  Temp: 97.7 F (36.5 C)  SpO2: 97%   Vitals:   04/22/20 1557  Weight: 167 lb 3.2 oz (75.8 kg)  Height: 5\' 10"  (1.778 m)   Body mass index is 23.99 kg/m. Ideal Body Weight: Weight in (lb) to have BMI = 25: 173.9  GEN: no acute distress.  Normal weight, looks well but uncomfortable with his neck HEENT: Atraumatic, Normocephalic.   Bilateral TM wnl, oropharynx normal.  PEERL,EOMI.   Ears and Nose: No external deformity. CV: RRR, No M/G/R. No JVD. No thrill. No extra heart sounds. PULM: CTA B, no wheezes, crackles, rhonchi. No retractions. No resp. distress. No accessory muscle use. EXTR: No c/c/e PSYCH: Normally interactive. Conversant.  Normal strength and DTR of all extremities.  Patient notes ' it hurts" when I touch his left arm compared to right He also feels that there is some intermittent tingling at the tips of his left hand fingers He notes tenderness of the midline and left posterior neck.  This extends into the upper trapezius but not further. Range of motion of the neck is very limited in all directions due to pain   Assessment and Plan: Neck pain - Plan: cyclobenzaprine (FLEXERIL) 10 MG tablet, HYDROcodone-acetaminophen (NORCO/VICODIN) 5-325 MG tablet, CT CERVICAL SPINE WO CONTRAST  Patient here today with concern of neck pain.  He had noted pain for couple of days without any injury.  He then slipped in the shower and fell forward, catching himself on his hands and a "push-up position".  He did not actually hit his head.  However, this fall seemed to exacerbate his neck pain he notes severe left-sided neck pain and stiffness at this time. He saw his chiropractor earlier today, they did some heat and TENS unit  but did not do x-rays. Discussed his  situation in detail with patient and his mother who is also present.  I called radiology to discuss plain film versus CT scan, we decided with injury and significant symptoms we will proceed directly to CT  Order stat CT today, assuming this is normal the patient will go home I encouraged heat and/or ice, and a foam neck support For more serious symptoms can use Flexeril at bedtime, hydrocodone during the day as needed.  Cautioned that these medications should not be combined with alcohol, they can cause sedation and should not be used when driving Encouraged him to take blood pressure medications when he gets home Signed Lamar Blinks, MD  Received his CT report as below, called patient and let him know results.  He states understanding, he would let me know if his symptoms do not improve over the next couple of days.  We can do an MRI if necessary  CT CERVICAL SPINE WO CONTRAST  Result Date: 04/22/2020 CLINICAL DATA:  Neck pain after injury last week. EXAM: CT CERVICAL SPINE WITHOUT CONTRAST TECHNIQUE: Multidetector CT imaging of the cervical spine was performed without intravenous contrast. Multiplanar CT image reconstructions were also generated. COMPARISON:  None. FINDINGS: Alignment: Normal. Skull base and vertebrae: No acute fracture. No primary bone lesion or focal pathologic process. Soft tissues and spinal canal: No prevertebral fluid or swelling. No visible canal hematoma. Disc levels: Mild degenerative disc disease is noted at C5-6 with anterior posterior osteophyte formation. Upper chest: Negative. Other: None. IMPRESSION: Mild degenerative disc disease is noted at C5-6. No acute abnormality seen in the cervical spine. Electronically Signed   By: Marijo Conception M.D.   On: 04/22/2020 17:29

## 2020-04-23 ENCOUNTER — Telehealth: Payer: Self-pay | Admitting: Family Medicine

## 2020-04-23 DIAGNOSIS — M9901 Segmental and somatic dysfunction of cervical region: Secondary | ICD-10-CM | POA: Diagnosis not present

## 2020-04-23 DIAGNOSIS — M9902 Segmental and somatic dysfunction of thoracic region: Secondary | ICD-10-CM | POA: Diagnosis not present

## 2020-04-23 DIAGNOSIS — M9908 Segmental and somatic dysfunction of rib cage: Secondary | ICD-10-CM | POA: Diagnosis not present

## 2020-04-23 DIAGNOSIS — R519 Headache, unspecified: Secondary | ICD-10-CM | POA: Diagnosis not present

## 2020-04-23 NOTE — Telephone Encounter (Signed)
Caller : Christine  Call Back : [phone]  Called to request information about patient's last MRI of Brain. Asked for telephone number to [place] location.

## 2020-04-24 ENCOUNTER — Telehealth: Payer: Self-pay | Admitting: Family Medicine

## 2020-04-24 NOTE — Telephone Encounter (Signed)
[facility] called for imaging. transferred to imaging [phone].

## 2020-04-25 DIAGNOSIS — M9902 Segmental and somatic dysfunction of thoracic region: Secondary | ICD-10-CM | POA: Diagnosis not present

## 2020-04-25 DIAGNOSIS — M9901 Segmental and somatic dysfunction of cervical region: Secondary | ICD-10-CM | POA: Diagnosis not present

## 2020-04-25 DIAGNOSIS — R519 Headache, unspecified: Secondary | ICD-10-CM | POA: Diagnosis not present

## 2020-04-25 DIAGNOSIS — M9908 Segmental and somatic dysfunction of rib cage: Secondary | ICD-10-CM | POA: Diagnosis not present

## 2020-04-29 DIAGNOSIS — M9902 Segmental and somatic dysfunction of thoracic region: Secondary | ICD-10-CM | POA: Diagnosis not present

## 2020-04-29 DIAGNOSIS — R519 Headache, unspecified: Secondary | ICD-10-CM | POA: Diagnosis not present

## 2020-04-29 DIAGNOSIS — M9908 Segmental and somatic dysfunction of rib cage: Secondary | ICD-10-CM | POA: Diagnosis not present

## 2020-04-29 DIAGNOSIS — M9901 Segmental and somatic dysfunction of cervical region: Secondary | ICD-10-CM | POA: Diagnosis not present

## 2020-05-02 DIAGNOSIS — R519 Headache, unspecified: Secondary | ICD-10-CM | POA: Diagnosis not present

## 2020-05-02 DIAGNOSIS — M9901 Segmental and somatic dysfunction of cervical region: Secondary | ICD-10-CM | POA: Diagnosis not present

## 2020-05-02 DIAGNOSIS — M9902 Segmental and somatic dysfunction of thoracic region: Secondary | ICD-10-CM | POA: Diagnosis not present

## 2020-05-02 DIAGNOSIS — M9908 Segmental and somatic dysfunction of rib cage: Secondary | ICD-10-CM | POA: Diagnosis not present

## 2020-05-07 DIAGNOSIS — M9901 Segmental and somatic dysfunction of cervical region: Secondary | ICD-10-CM | POA: Diagnosis not present

## 2020-05-07 DIAGNOSIS — R519 Headache, unspecified: Secondary | ICD-10-CM | POA: Diagnosis not present

## 2020-05-07 DIAGNOSIS — M9908 Segmental and somatic dysfunction of rib cage: Secondary | ICD-10-CM | POA: Diagnosis not present

## 2020-05-07 DIAGNOSIS — M9902 Segmental and somatic dysfunction of thoracic region: Secondary | ICD-10-CM | POA: Diagnosis not present

## 2020-05-13 ENCOUNTER — Other Ambulatory Visit: Payer: Self-pay | Admitting: Medical

## 2020-05-19 ENCOUNTER — Other Ambulatory Visit: Payer: Self-pay | Admitting: Medical

## 2020-05-20 ENCOUNTER — Emergency Department (HOSPITAL_BASED_OUTPATIENT_CLINIC_OR_DEPARTMENT_OTHER)
Admission: EM | Admit: 2020-05-20 | Discharge: 2020-05-20 | Disposition: A | Discharge status: Home / Self Care | Payer: BC Managed Care – PPO | Attending: Emergency Medicine | Admitting: Emergency Medicine

## 2020-05-20 ENCOUNTER — Other Ambulatory Visit: Payer: Self-pay

## 2020-05-20 ENCOUNTER — Encounter (HOSPITAL_BASED_OUTPATIENT_CLINIC_OR_DEPARTMENT_OTHER): Payer: Self-pay

## 2020-05-20 DIAGNOSIS — Z79899 Other long term (current) drug therapy: Secondary | ICD-10-CM | POA: Diagnosis not present

## 2020-05-20 DIAGNOSIS — Z20822 Contact with and (suspected) exposure to covid-19: Secondary | ICD-10-CM | POA: Diagnosis not present

## 2020-05-20 DIAGNOSIS — R55 Syncope and collapse: Secondary | ICD-10-CM

## 2020-05-20 DIAGNOSIS — F909 Attention-deficit hyperactivity disorder, unspecified type: Secondary | ICD-10-CM | POA: Diagnosis not present

## 2020-05-20 DIAGNOSIS — J029 Acute pharyngitis, unspecified: Secondary | ICD-10-CM

## 2020-05-20 DIAGNOSIS — R5383 Other fatigue: Secondary | ICD-10-CM | POA: Insufficient documentation

## 2020-05-20 DIAGNOSIS — F17210 Nicotine dependence, cigarettes, uncomplicated: Secondary | ICD-10-CM | POA: Diagnosis not present

## 2020-05-20 DIAGNOSIS — I10 Essential (primary) hypertension: Secondary | ICD-10-CM | POA: Insufficient documentation

## 2020-05-20 LAB — BASIC METABOLIC PANEL
Anion gap: 10 (ref 5–15)
BUN: 13 mg/dL (ref 6–20)
CO2: 26 mmol/L (ref 22–32)
Calcium: 9 mg/dL (ref 8.9–10.3)
Chloride: 101 mmol/L (ref 98–111)
Creatinine, Ser: 1.29 mg/dL — ABNORMAL HIGH (ref 0.61–1.24)
GFR calc Af Amer: >60 mL/min (ref >60)
GFR calc non Af Amer: >60 mL/min (ref >60)
Glucose, Bld: 105 mg/dL — ABNORMAL HIGH (ref 70–99)
Potassium: 3.8 mmol/L (ref 3.5–5.1)
Sodium: 137 mmol/L (ref 135–145)

## 2020-05-20 LAB — CBC WITH DIFFERENTIAL/PLATELET
Abs Immature Granulocytes: 0.05 10*3/uL (ref 0.00–0.07)
Basophils Absolute: 0 10*3/uL (ref 0.0–0.1)
Basophils Relative: 0 %
Eosinophils Absolute: 0.2 10*3/uL (ref 0.0–0.5)
Eosinophils Relative: 2 %
HCT: 45.9 % (ref 39.0–52.0)
Hemoglobin: 15.8 g/dL (ref 13.0–17.0)
Immature Granulocytes: 0 %
Lymphocytes Relative: 19 %
Lymphs Abs: 2.4 10*3/uL (ref 0.7–4.0)
MCH: 32.4 pg (ref 26.0–34.0)
MCHC: 34.4 g/dL (ref 30.0–36.0)
MCV: 94.3 fL (ref 80.0–100.0)
Monocytes Absolute: 1 10*3/uL (ref 0.1–1.0)
Monocytes Relative: 8 %
Neutro Abs: 8.8 10*3/uL — ABNORMAL HIGH (ref 1.7–7.7)
Neutrophils Relative %: 71 %
Platelets: 237 10*3/uL (ref 150–400)
RBC: 4.87 MIL/uL (ref 4.22–5.81)
RDW: 12 % (ref 11.5–15.5)
WBC: 12.5 10*3/uL — ABNORMAL HIGH (ref 4.0–10.5)
nRBC: 0 % (ref 0.0–0.2)

## 2020-05-20 LAB — GROUP A STREP BY PCR: Group A Strep by PCR: NOT DETECTED

## 2020-05-20 LAB — SARS CORONAVIRUS 2 BY RT PCR (HOSPITAL ORDER, PERFORMED IN ~~LOC~~ HOSPITAL LAB): SARS Coronavirus 2: NEGATIVE

## 2020-05-20 MED ORDER — SODIUM CHLORIDE 0.9 % IV BOLUS
1000.0000 mL | Freq: Once | INTRAVENOUS | Status: AC
  Administered 2020-05-20: 1000 mL via INTRAVENOUS

## 2020-05-20 MED ORDER — ONDANSETRON HCL 4 MG/2ML IJ SOLN
4.0000 mg | Freq: Once | INTRAMUSCULAR | Status: AC
  Administered 2020-05-20: 4 mg via INTRAVENOUS
  Filled 2020-05-20: qty 2

## 2020-05-20 NOTE — Discharge Instructions (Signed)
Make sure that you are staying well-hydrated. Use Tylenol for symptomatic relief. Your creatinine (kidney function) was mildly elevated. This needs to be rechecked by your primary care doctor. Return here as needed if you have any worsening symptoms.

## 2020-05-20 NOTE — ED Notes (Signed)
ED Provider at bedside. 

## 2020-05-20 NOTE — ED Triage Notes (Signed)
Pt arrives to ED driven by boss after syncopal episode at work today and c/o sore throat. Pt. Also endorses being noncompliant with BP medication.

## 2020-05-20 NOTE — ED Provider Notes (Signed)
[facility] DEPART[facility] Provider Note   CSN: [id number] Arrival date & time: 05/20/20  2878     History Chief Complaint  Patient presents with  . Sore Throat  . Loss of Consciousness  . Shortness of Breath    Stephen Castro is a 29 y.o. male.  Patient is a 29 year old male who presents after syncopal episode.  He said that he started having a sore throat yesterday.  He said he woke up this morning with a sore throat and took some Tylenol and went to work.  He said while he was at work he was feeling okay but went outside to smoke a cigarette.  When he came back inside, he started to get nauseated and a little lightheaded.  He sat down to drink a Coke.  He stood up and got tunnel vision and blacked out, falling on the floor.  He had a brief loss of consciousness.  There was not any reported shaking.  He does not feel like he has any injuries from the fall.  He has very slight headache.  No neck or back pain.  No numbness or weakness to his extremities.  No chest pain or palpitations.  No shortness of breath.  No abdominal pain.  He does have some diffuse myalgias.  He does not report any fevers.  He has a mild cough but no runny nose or congestion.  No known sick contacts.  He is not vaccinated for Covid.        Past Medical History:  Diagnosis Date  . Aneurysm ([place]) 2020  . Anxiety    managed per pt  . Depression    "haven't really dealt with" in "quite some time"  . High blood pressure    slightly elevated    Patient Active Problem List   Diagnosis Date Noted  . Chronic migraine without aura without status migrainosus, not intractable 08/08/2019  . Hypertension 06/30/2019  . Brain aneurysm 06/17/2019  . Gastroenteritis 04/13/2019  . Shoulder injury, left, initial encounter 01/24/2018  . ADHD (attention deficit hyperactivity disorder) 04/04/2013  . Anxiety state, unspecified 04/04/2013    History reviewed. No pertinent surgical history.      Family History  Problem Relation Age of Onset  . Stroke Mother        carotid artery dissection during massage   . Aneurysm Paternal Grandmother   . Hypertension Other        both sides of family  . Lung cancer Other        both sides of family   . Cerebral aneurysm Maternal Uncle   . Migraines Neg Hx        none that he is aware of    Social History   Tobacco Use  . Smoking status: Current Every Day Smoker    Packs/day: 0.50    Types: Cigarettes  . Smokeless tobacco: Never Used  . Tobacco comment: 08/08/2019 update, trying to cut back   Vaping Use  . Vaping Use: Former  Substance Use Topics  . Alcohol use: Yes    Comment: social  . Drug use: Yes    Types: Marijuana    Home Medications Prior to Admission medications   Medication Sig Start Date End Date Taking? Authorizing Provider  cyclobenzaprine (FLEXERIL) 10 MG tablet Take 1 tablet (10 mg total) by mouth 2 (two) times daily as needed for muscle spasms. 04/22/20  Yes Copland, Gay Filler, MD  ibuprofen (ADVIL) 200 MG tablet Take 400  mg by mouth every 6 (six) hours as needed for moderate pain.   Yes [provider]  losartan (COZAAR) 50 MG tablet Take 1 tablet (50 mg total) by mouth daily. 06/08/19  Yes Paz, Alda Berthold, MD  albuterol (VENTOLIN HFA) 108 (90 Base) MCG/ACT inhaler Inhale 2 puffs into the lungs every 6 (six) hours as needed for wheezing or shortness of breath. 05/13/20   Copland, Gay Filler, MD  lidocaine (XYLOCAINE) 2 % solution Use as directed 15 mLs in the mouth or throat as needed for mouth pain. 01/24/20   Scot Jun, FNP  nicotine (NICODERM CQ - DOSED IN MG/24 HOURS) 21 mg/24hr patch Place 1 patch (21 mg total) onto the skin daily. 04/18/20   Saguier, [name], PA-C    Allergies    Patient has no known allergies.  Review of Systems   Review of Systems  Constitutional: Positive for diaphoresis and fatigue. Negative for chills and fever.  HENT: Positive for sore throat. Negative for congestion,  rhinorrhea and sneezing.   Eyes: Negative.   Respiratory: Negative for cough, chest tightness and shortness of breath.   Cardiovascular: Negative for chest pain and leg swelling.  Gastrointestinal: Positive for nausea. Negative for abdominal pain, blood in stool, diarrhea and vomiting.  Genitourinary: Negative for difficulty urinating, flank pain, frequency and hematuria.  Musculoskeletal: Positive for myalgias. Negative for arthralgias and back pain.  Skin: Negative for rash.  Neurological: Positive for syncope. Negative for dizziness, speech difficulty, weakness, numbness and headaches.    Physical Exam Updated Vital Signs BP (!) 140/100 (BP Location: Right Arm)   Pulse 63   Temp 97.8 F (36.6 C) (Oral)   Resp 16   Ht 5\' 10"  (1.778 m)   Wt 68 kg   SpO2 98%   BMI 21.52 kg/m   Physical Exam Constitutional:      Appearance: He is well-developed.  HENT:     Head: Normocephalic and atraumatic.     Mouth/Throat:     Mouth: Mucous membranes are moist.     Comments: Mild erythema to the posterior pharynx, no exudates, uvula is midline, no trismus, no elevation of the tongue Eyes:     Pupils: Pupils are equal, round, and reactive to light.  Cardiovascular:     Rate and Rhythm: Normal rate and regular rhythm.     Heart sounds: Normal heart sounds.  Pulmonary:     Effort: Pulmonary effort is normal. No respiratory distress.     Breath sounds: Normal breath sounds. No wheezing or rales.  Chest:     Chest wall: No tenderness.  Abdominal:     General: Bowel sounds are normal.     Palpations: Abdomen is soft.     Tenderness: There is no abdominal tenderness. There is no guarding or rebound.  Musculoskeletal:        General: Normal range of motion.     Cervical back: Normal range of motion and neck supple.  Lymphadenopathy:     Cervical: No cervical adenopathy.  Skin:    General: Skin is warm and dry.     Findings: No rash.  Neurological:     Mental Status: He is alert and  oriented to person, place, and time.     ED Results / Procedures / Treatments   Labs (all labs ordered are listed, but only abnormal results are displayed) Labs Reviewed  BASIC METABOLIC PANEL - Abnormal; Notable for the following components:      Result Value  Glucose, Bld 105 (*)    Creatinine, Ser 1.29 (*)    All other components within normal limits  CBC WITH DIFFERENTIAL/PLATELET - Abnormal; Notable for the following components:   WBC 12.5 (*)    Neutro Abs 8.8 (*)    All other components within normal limits  GROUP A STREP BY PCR  SARS CORONAVIRUS 2 BY RT PCR [facility] ORDER, [facility])    EKG EKG Interpretation  Date/Time:  Monday May 20 2020 10:17:55 EDT Ventricular Rate:  68 PR Interval:  120 QRS Duration: 104 QT Interval:  408 QTC Calculation: 433 R Axis:   100 Text Interpretation: Normal sinus rhythm Rightward axis RSR' or QR pattern in V1 suggests right ventricular conduction delay Borderline ECG since last tracing no significant change Confirmed by Malvin Johns [phone]) on 05/20/2020 10:54:20 AM   Radiology No results found.  Procedures Procedures (including critical care time)  Medications Ordered in ED Medications  sodium chloride 0.9 % bolus 1,000 mL (0 mLs Intravenous Stopped 05/20/20 1258)  ondansetron (ZOFRAN) injection 4 mg (4 mg Intravenous Given 05/20/20 1144)    ED Course  I have reviewed the triage vital signs and the nursing notes.  Pertinent labs & imaging results that were available during my care of the patient were reviewed by me and considered in my medical decision making (see chart for details).    MDM Rules/Calculators/A&P                          Patient presents after syncopal episode. He does not have any evident injuries from the syncopal event. He has viral-like symptoms including sore throat and myalgias. His rapid strep is negative. His Covid test is negative. He does not have any clinical  symptoms of pneumonia. His creatinine was mildly elevated. He is otherwise well-appearing. He does not have any significant headache or neck pain. No meningeal symptoms. He was given IV fluids and is feeling much better. He says he is ready to go home. He does not have any evidence of arrhythmias. He likely had a vasovagal episode related to the viral syndrome. He has a history of a cerebral aneurysm that is being monitored. He does not have any worsening headaches or other suggestions of subarachnoid hemorrhage. He was discharged home in good condition. He was encouraged to follow-up with his PCP regarding his mildly elevated creatinine. They can also recheck his blood pressure at that time. Apparently he is noncompliant with his blood pressure medication. Return precautions were given. Final Clinical Impression(s) / ED Diagnoses Final diagnoses:  Pharyngitis, unspecified etiology  Vasovagal syncope    Rx / DC Orders ED Discharge Orders    None       Malvin Johns, MD 05/20/20 1324

## 2020-06-06 DIAGNOSIS — Z20828 Contact with and (suspected) exposure to other viral communicable diseases: Secondary | ICD-10-CM | POA: Diagnosis not present

## 2020-07-29 DIAGNOSIS — R6883 Chills (without fever): Secondary | ICD-10-CM | POA: Diagnosis not present

## 2020-07-29 DIAGNOSIS — R519 Headache, unspecified: Secondary | ICD-10-CM | POA: Diagnosis not present

## 2020-07-29 DIAGNOSIS — Z20822 Contact with and (suspected) exposure to covid-19: Secondary | ICD-10-CM | POA: Diagnosis not present

## 2020-07-30 ENCOUNTER — Telehealth (INDEPENDENT_AMBULATORY_CARE_PROVIDER_SITE_OTHER): Payer: BC Managed Care – PPO | Admitting: Family

## 2020-07-30 ENCOUNTER — Other Ambulatory Visit: Payer: Self-pay

## 2020-07-30 VITALS — BP 145/95 | HR 71 | Temp 97.5°F

## 2020-07-30 DIAGNOSIS — B349 Viral infection, unspecified: Secondary | ICD-10-CM | POA: Diagnosis not present

## 2020-07-30 MED ORDER — NAPROXEN 500 MG PO TABS
500.0000 mg | ORAL_TABLET | Freq: Two times a day (BID) | ORAL | 0 refills | Status: DC | PRN
Start: 2020-07-30 — End: 2020-10-14

## 2020-07-30 NOTE — Progress Notes (Signed)
Virtual Visit via Video Note  I connected with [name] on 07/30/20 at  4:40 PM EDT by a video enabled telemedicine application and verified that I am speaking with the correct person using two identifiers.  Location: Patient: home Provider: work   I discussed the limitations of evaluation and management by telemedicine and the availability of in person appointments. The patient expressed understanding and agreed to proceed. The patient, his wife, and myself were present for today's video call.   History of Present Illness:  Patient is a 29 year old male who presents today with chief complaint of fatigue and myalgia.  He reports that his stepson came home on September 14 with cough and nasal congestion.  The patient began to develop symptoms on September 18.  The patient's initial symptoms included myalgias, joint pain, and most recently some sciatic pain.  He reports that he has been extremely fatigued and has been sleeping up to 20 hours a day.  He has had associated nausea but remains quite hungry.  He feels as though his skin hurts and has a crawling feeling.  He denies sinus pain or nasal congestion.  He also denies sore throat.  He denies loss of taste or smell.  His wife is ill with similar symptoms.  He and his wife and stepson are all vaccinated against Covid.  He states that they have all been tested for Covid and have tested negative.  The patient states he is also been tested for flu and has tested negative.  He reports that the myalgias are so bad that he and his wife have moved an air mattress to the living room so they do not have to walk as far to get to the kitchen and bathroom.  Observations/Objective:   Gen: Awake, alert, no acute distress Resp: Breathing is even and non-labored Psych: calm/pleasant demeanor Neuro: Alert and Oriented x 3, + facial symmetry, speech is clear.   Assessment and Plan:  Acute viral illness-the patient is frustrated with his ongoing  symptoms.  I advised him that I still think it is possible that he and his wife may have been infected with COVID-19 even though he is vaccinated and has tested negative.  In any event he is outside the window for any monoclonal antibody infusion if he did in fact have COVID-19.  He is most bothered by his myalgias at this time.  He is having minimal improvement with ibuprofen.  I suggested trial of naproxen as needed.  I have sent a prescription to his pharmacy.  Encouraged him to continue rest and hydration.  He is encouraged to let us know if he is not at least some better by Friday, October 1.  He is advised to go to urgent care should he develop fever greater than 101, shortness of breath, increased weakness, or inability to eat or drink.  Patient verbalizes understanding.   Follow Up Instructions:    I discussed the assessment and treatment plan with the patient. The patient was provided an opportunity to ask questions and all were answered. The patient agreed with the plan and demonstrated an understanding of the instructions.   The patient was advised to call back or seek an in-person evaluation if the symptoms worsen or if the condition fails to improve as anticipated.  Nance Pear, NP

## 2020-08-04 ENCOUNTER — Other Ambulatory Visit: Payer: Self-pay | Admitting: Family

## 2020-09-10 DIAGNOSIS — M9901 Segmental and somatic dysfunction of cervical region: Secondary | ICD-10-CM | POA: Diagnosis not present

## 2020-09-10 DIAGNOSIS — M9902 Segmental and somatic dysfunction of thoracic region: Secondary | ICD-10-CM | POA: Diagnosis not present

## 2020-09-10 DIAGNOSIS — R519 Headache, unspecified: Secondary | ICD-10-CM | POA: Diagnosis not present

## 2020-09-10 DIAGNOSIS — M9908 Segmental and somatic dysfunction of rib cage: Secondary | ICD-10-CM | POA: Diagnosis not present

## 2020-09-17 DIAGNOSIS — M9903 Segmental and somatic dysfunction of lumbar region: Secondary | ICD-10-CM | POA: Diagnosis not present

## 2020-09-17 DIAGNOSIS — M546 Pain in thoracic spine: Secondary | ICD-10-CM | POA: Diagnosis not present

## 2020-09-17 DIAGNOSIS — M9902 Segmental and somatic dysfunction of thoracic region: Secondary | ICD-10-CM | POA: Diagnosis not present

## 2020-09-17 DIAGNOSIS — M9901 Segmental and somatic dysfunction of cervical region: Secondary | ICD-10-CM | POA: Diagnosis not present

## 2020-09-21 IMAGING — CT CT HEAD WITHOUT CONTRAST
3 series · 14 of 47 positions shown, 16 images · non-contrast
Comparison: 12/07/2013 head CT

CLINICAL DATA: 28-year-old male with severe headache for 5 days.

EXAM:
CT HEAD WITHOUT CONTRAST
TECHNIQUE: Contiguous axial images were obtained from the base of the skull
through the vertex without intravenous contrast.

[Series 2: head wo · axial · 0.47mm/px · z∈[-163,-33]mm · 8 of 32 slices shown, 10 images]
[im 3/32  brain]
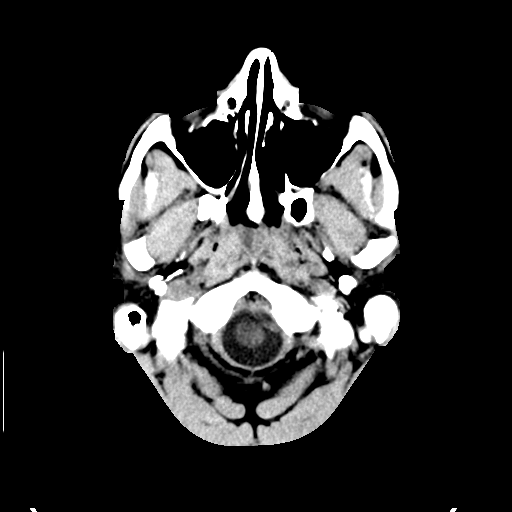
[im 3/32  bone]
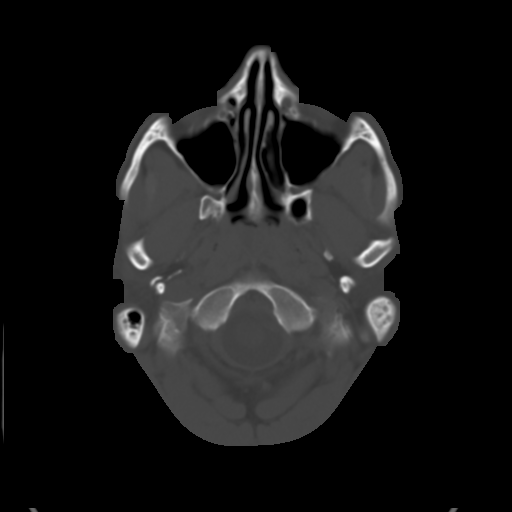
[im 7/32  brain]
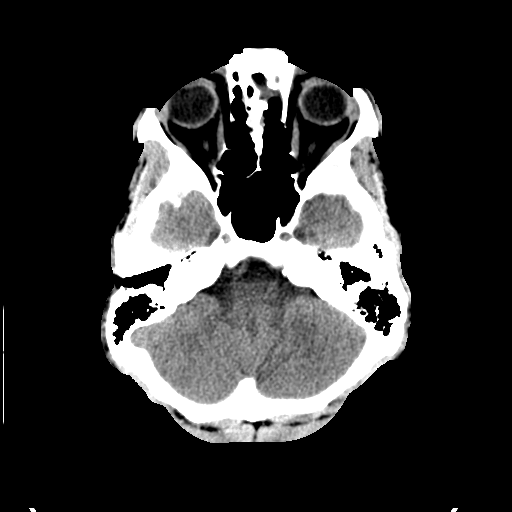
[im 10/32  brain]
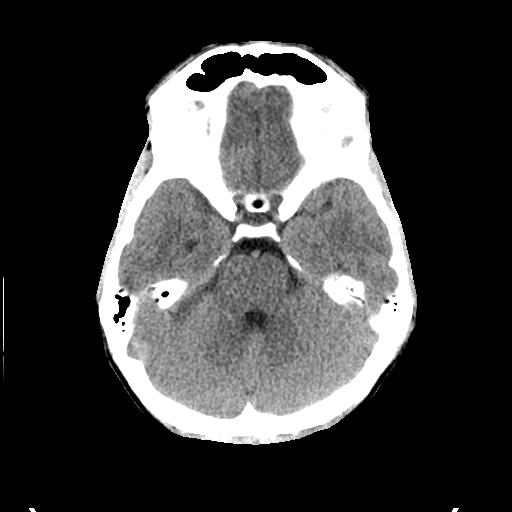
[im 14/32  brain]
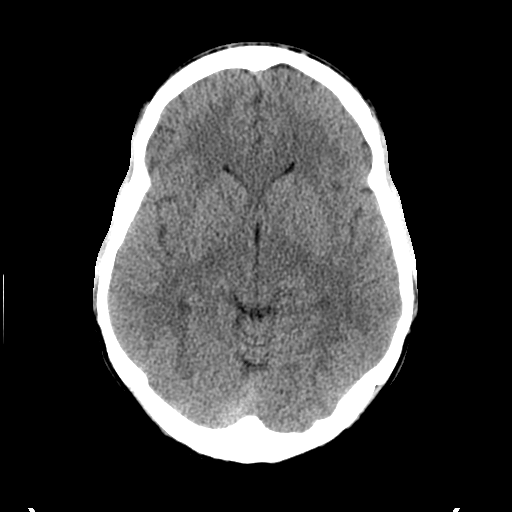
[im 18/32  brain]
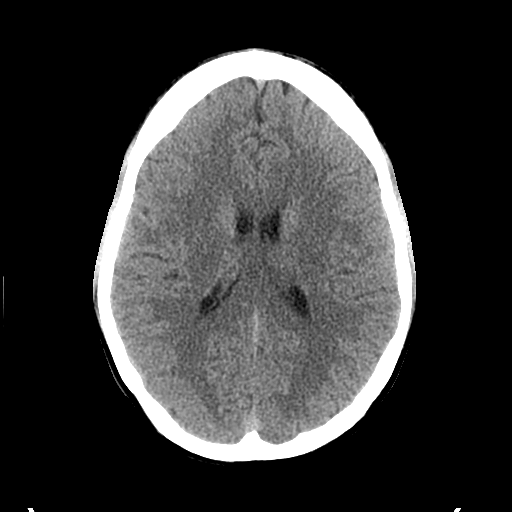
[im 18/32  bone]
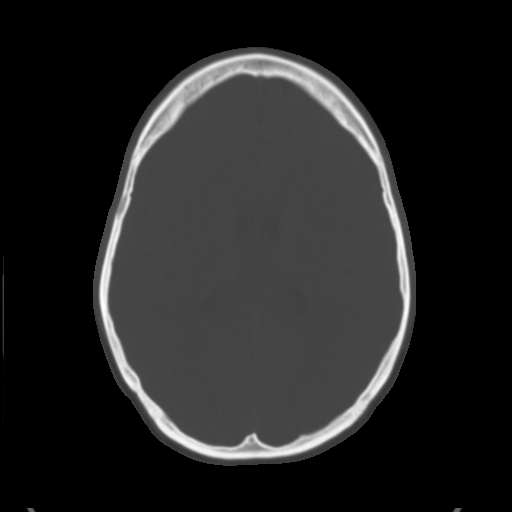
[im 22/32  brain]
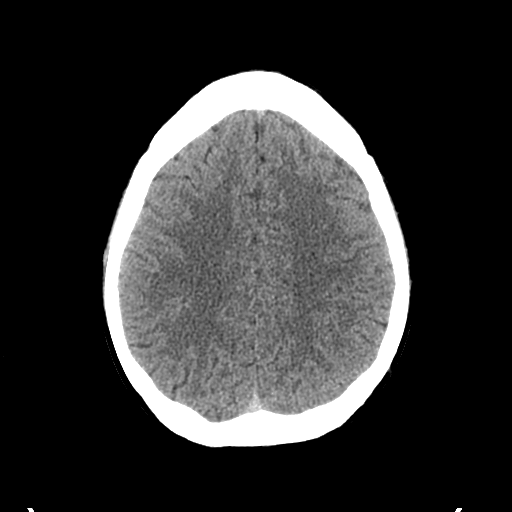
[im 25/32  brain]
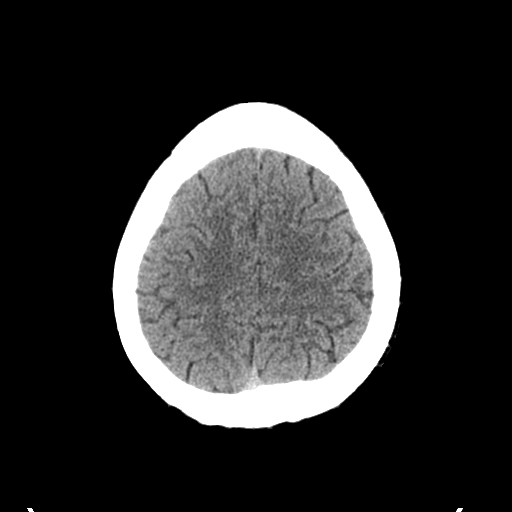
[im 29/32  brain]
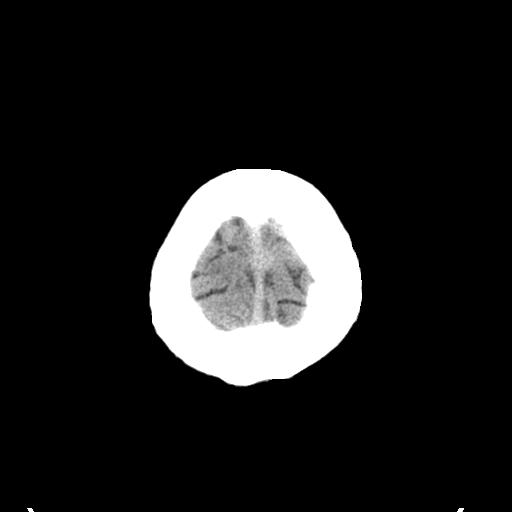

[Series 4: coronal soft tissue · coronal · 0.32mm/px · 3 of 67 slices shown]
[im 23/67  brain]
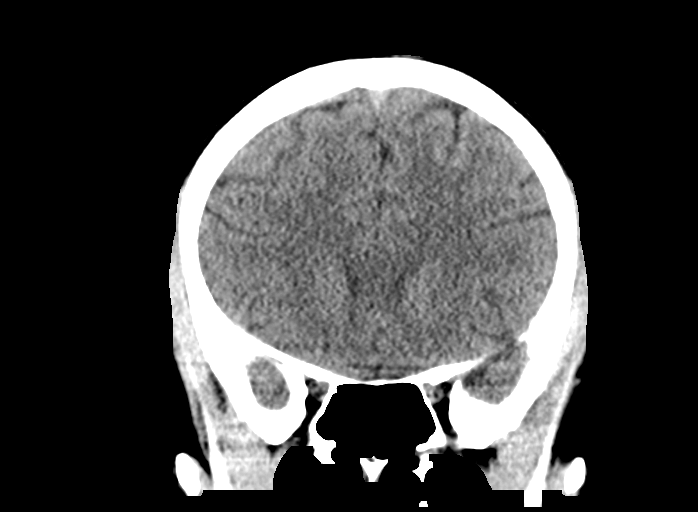
[im 30/67  brain]
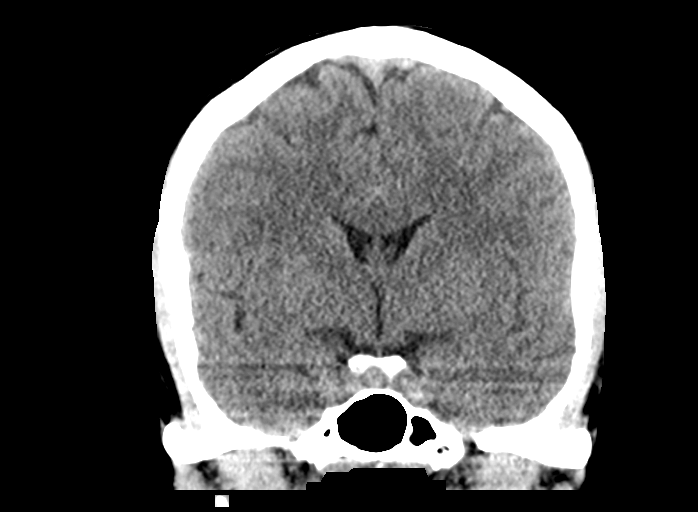
[im 37/67  brain]
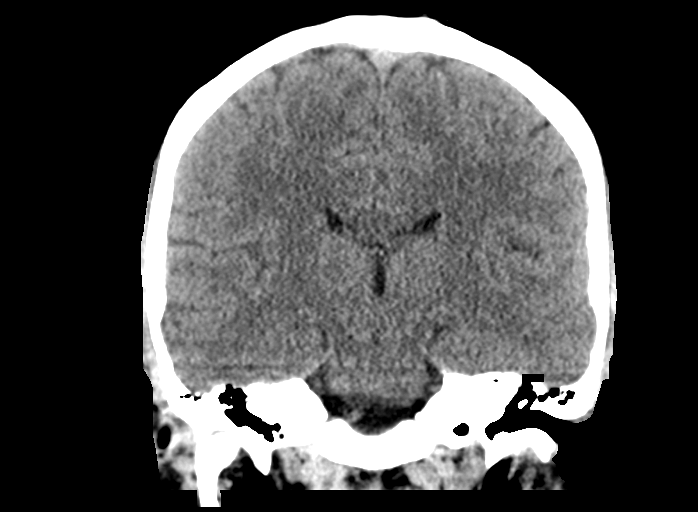

[Series 5: sagittal soft tissue · sagittal · 0.31mm/px · 3 of 51 slices shown]
[im 17/51  brain]
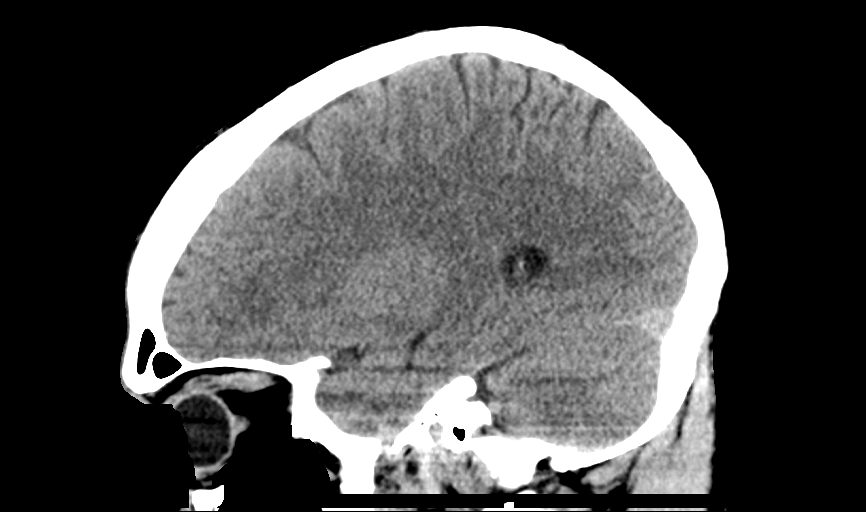
[im 26/51  brain]
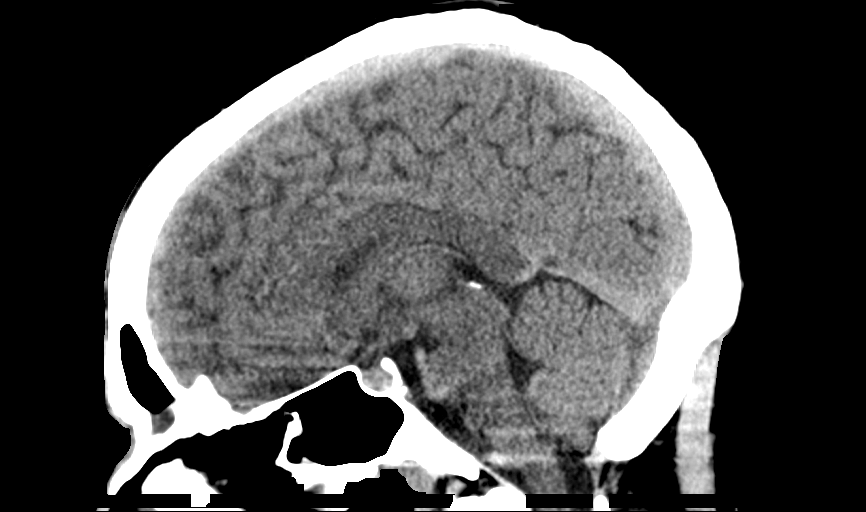
[im 34/51  brain]
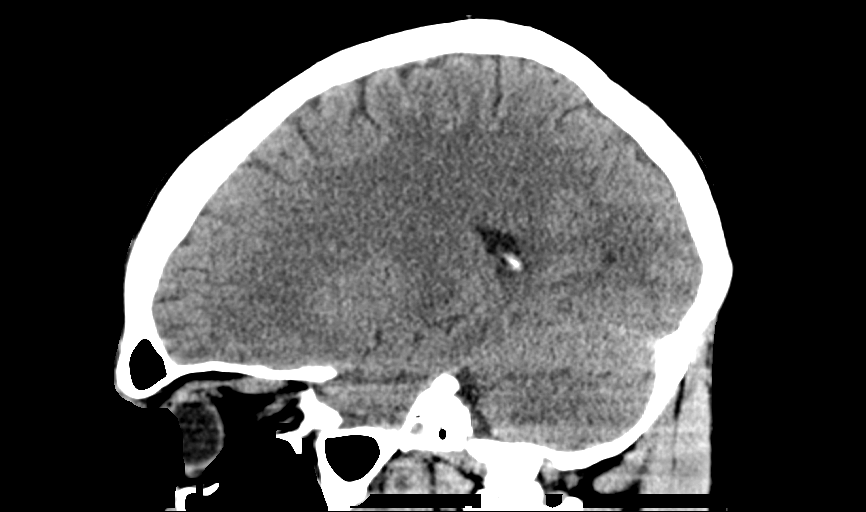

[14 of 47 positions shown; findings below may reference images not displayed]

FINDINGS: Brain: No evidence of acute infarction, hemorrhage, hydrocephalus,
extra-axial collection or mass lesion/mass effect.

Vascular: No hyperdense vessel or unexpected calcification.

Skull: Normal. Negative for fracture or focal lesion.

Sinuses/Orbits: Fluid in the LEFT frontal sinus noted.

Other: None.
IMPRESSION: 1. No intracranial abnormality
2. LEFT frontal sinus fluid which may indicate acute sinusitis.

## 2020-09-24 DIAGNOSIS — M9901 Segmental and somatic dysfunction of cervical region: Secondary | ICD-10-CM | POA: Diagnosis not present

## 2020-09-24 DIAGNOSIS — M546 Pain in thoracic spine: Secondary | ICD-10-CM | POA: Diagnosis not present

## 2020-09-24 DIAGNOSIS — M9903 Segmental and somatic dysfunction of lumbar region: Secondary | ICD-10-CM | POA: Diagnosis not present

## 2020-09-24 DIAGNOSIS — M9902 Segmental and somatic dysfunction of thoracic region: Secondary | ICD-10-CM | POA: Diagnosis not present

## 2020-09-30 DIAGNOSIS — M5459 Other low back pain: Secondary | ICD-10-CM | POA: Diagnosis not present

## 2020-09-30 DIAGNOSIS — Q6650 Congenital pes planus, unspecified foot: Secondary | ICD-10-CM | POA: Diagnosis not present

## 2020-09-30 DIAGNOSIS — M5416 Radiculopathy, lumbar region: Secondary | ICD-10-CM | POA: Diagnosis not present

## 2020-10-01 DIAGNOSIS — M9903 Segmental and somatic dysfunction of lumbar region: Secondary | ICD-10-CM | POA: Diagnosis not present

## 2020-10-01 DIAGNOSIS — M9901 Segmental and somatic dysfunction of cervical region: Secondary | ICD-10-CM | POA: Diagnosis not present

## 2020-10-01 DIAGNOSIS — M9902 Segmental and somatic dysfunction of thoracic region: Secondary | ICD-10-CM | POA: Diagnosis not present

## 2020-10-01 DIAGNOSIS — M546 Pain in thoracic spine: Secondary | ICD-10-CM | POA: Diagnosis not present

## 2020-10-08 DIAGNOSIS — M9902 Segmental and somatic dysfunction of thoracic region: Secondary | ICD-10-CM | POA: Diagnosis not present

## 2020-10-08 DIAGNOSIS — M9901 Segmental and somatic dysfunction of cervical region: Secondary | ICD-10-CM | POA: Diagnosis not present

## 2020-10-08 DIAGNOSIS — M546 Pain in thoracic spine: Secondary | ICD-10-CM | POA: Diagnosis not present

## 2020-10-08 DIAGNOSIS — M9903 Segmental and somatic dysfunction of lumbar region: Secondary | ICD-10-CM | POA: Diagnosis not present

## 2020-10-14 ENCOUNTER — Ambulatory Visit: Payer: BC Managed Care – PPO | Admitting: Family Medicine

## 2020-10-14 ENCOUNTER — Other Ambulatory Visit: Payer: Self-pay

## 2020-10-14 ENCOUNTER — Encounter: Payer: Self-pay | Admitting: Family Medicine

## 2020-10-14 VITALS — BP 126/82 | HR 65 | Resp 18 | Ht 70.0 in | Wt 178.0 lb

## 2020-10-14 DIAGNOSIS — I158 Other secondary hypertension: Secondary | ICD-10-CM

## 2020-10-14 DIAGNOSIS — Z13220 Encounter for screening for lipoid disorders: Secondary | ICD-10-CM

## 2020-10-14 DIAGNOSIS — Z130 Encounter for screening for diseases of the blood and blood-forming organs and certain disorders involving the immune mechanism: Secondary | ICD-10-CM

## 2020-10-14 DIAGNOSIS — R200 Anesthesia of skin: Secondary | ICD-10-CM | POA: Diagnosis not present

## 2020-10-14 DIAGNOSIS — Z131 Encounter for screening for diabetes mellitus: Secondary | ICD-10-CM

## 2020-10-14 LAB — CBC
HCT: 42 % (ref 39.0–52.0)
Hemoglobin: 14.4 g/dL (ref 13.0–17.0)
MCHC: 34.2 g/dL (ref 30.0–36.0)
MCV: 104 fl — ABNORMAL HIGH (ref 78.0–100.0)
Platelets: 256 10*3/uL (ref 150.0–400.0)
RBC: 4.04 Mil/uL — ABNORMAL LOW (ref 4.22–5.81)
RDW: 15 % (ref 11.5–15.5)
WBC: 10.5 10*3/uL (ref 4.0–10.5)

## 2020-10-14 LAB — COMPREHENSIVE METABOLIC PANEL
ALT: 20 U/L (ref 0–53)
AST: 17 U/L (ref 0–37)
Albumin: 4.4 g/dL (ref 3.5–5.2)
Alkaline Phosphatase: 61 U/L (ref 39–117)
BUN: 15 mg/dL (ref 6–23)
CO2: 32 mEq/L (ref 19–32)
Calcium: 9.7 mg/dL (ref 8.4–10.5)
Chloride: 102 mEq/L (ref 96–112)
Creatinine, Ser: 1.15 mg/dL (ref 0.40–1.50)
GFR: 84.81 mL/min (ref >60.00)
Glucose, Bld: 83 mg/dL (ref 70–99)
Potassium: 4.7 mEq/L (ref 3.5–5.1)
Sodium: 139 mEq/L (ref 135–145)
Total Bilirubin: 0.4 mg/dL (ref 0.2–1.2)
Total Protein: 6.9 g/dL (ref 6.0–8.3)

## 2020-10-14 LAB — FOLATE: Folate: 15.5 ng/mL (ref >5.9)

## 2020-10-14 LAB — LIPID PANEL
Cholesterol: 186 mg/dL (ref 0–200)
HDL: 30.7 mg/dL — ABNORMAL LOW (ref >39.00)
LDL Cholesterol: 128 mg/dL — ABNORMAL HIGH (ref 0–99)
NonHDL: 155.24
Total CHOL/HDL Ratio: 6
Triglycerides: 137 mg/dL (ref 0.0–149.0)
VLDL: 27.4 mg/dL (ref 0.0–40.0)

## 2020-10-14 LAB — HEMOGLOBIN A1C: Hgb A1c MFr Bld: 5.4 % (ref 4.6–6.5)

## 2020-10-14 LAB — TSH: TSH: 3.33 u[IU]/mL (ref 0.35–4.50)

## 2020-10-14 LAB — FERRITIN: Ferritin: 132.4 ng/mL (ref 22.0–322.0)

## 2020-10-14 LAB — VITAMIN B12: Vitamin B-12: >1526 pg/mL — ABNORMAL HIGH (ref 211–911)

## 2020-10-14 NOTE — Progress Notes (Addendum)
[facility] [place], [place], [place] [address] [phone]  Date:  10/14/2020   Name:  Stephen Castro   DOB:  11-Feb-1991   MRN:  [id number]  PCP:  Darreld Mclean, MD    Chief Complaint: Tingling in fingers (Tingling in bilateral hands, nerve pain, loss of sensation and strength in hands/)   History of Present Illness:  Stephen Castro is a 29 y.o. 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 He started seeing a massage therapist which really helped his back- he is not aware of any trauma that might have occurred during massage.  In fact, he feels as though massage therapy is doing him a lot of good  He notes that he just awoke with "real bad nerve pain in his hands" bilaterally - this started 4-5 days ago  About the same in both hands, sx are the most pronounced in his thumb, index and long fingers His sx may be worse in the am and right before bed He feels like his grip strength is a bit lessened bilaterally No new injury (Of note, his wife Stephen Castro was recently seen with peripheral? neuropathy in her hands and feet.  She had a borderline B12 and is seeing neurology.  There are some concern that she may have lupus.  Possible that some of Stephen Castro's symptoms are related to his concern about his wife)  He started taking a  B12 supplement himself a few days ago - he thinks this might be helping He has "sciatic pain" in his lower back and down both his legs over the last month - this is worse since the weather got cold   He has "the same neck pain I've been dealing with" but nothing new -neck pain is not a major feature at this time He is not taking his losartan right now- He is trying to manage his BP with diet, avoiding alcohol, etc  He notes that generally his blood pressure has been well controlled, but that he will have occasional significantly high readings, systolic 1 29-5 60  He had his covid vaccine - we discussed booster   Patient Active Problem List   Diagnosis Date Noted  . Chronic migraine without aura without status migrainosus, not intractable 08/08/2019  . Hypertension 06/30/2019  . Brain aneurysm 06/17/2019  . Gastroenteritis 04/13/2019  . Shoulder injury, left, initial encounter 01/24/2018  . ADHD (attention deficit hyperactivity disorder) 04/04/2013  . Anxiety state, unspecified 04/04/2013    Past Medical History:  Diagnosis Date  . Aneurysm ([place]) 2020  . Anxiety    managed per pt  . Depression    "haven't really dealt with" in "quite some time"  . High blood pressure  slightly elevated    No past surgical history on file.  Social History   Tobacco Use  . Smoking status: Current Every Day Smoker    Packs/day: 0.50    Types: Cigarettes  . Smokeless tobacco: Never Used  . Tobacco comment: 08/08/2019 update, trying to cut back   Vaping Use  . Vaping Use: Former  Substance Use Topics  . Alcohol use: Yes    Comment: social  . Drug use: Yes    Types: Marijuana    Family History  Problem Relation Age of Onset  . Stroke Mother        carotid artery dissection during massage   . Aneurysm Paternal Grandmother   . Hypertension Other        both sides of family  . Lung cancer Other        both sides of family   . Cerebral aneurysm Maternal Uncle   . Migraines Neg Hx         none that he is aware of    No Known Allergies  Medication list has been reviewed and updated.  Current Outpatient Medications on File Prior to Visit  Medication Sig Dispense Refill  . cyclobenzaprine (FLEXERIL) 10 MG tablet Take 1 tablet (10 mg total) by mouth 2 (two) times daily as needed for muscle spasms. 20 tablet 0  . albuterol (VENTOLIN HFA) 108 (90 Base) MCG/ACT inhaler Inhale 2 puffs into the lungs every 6 (six) hours as needed for wheezing or shortness of breath. (Patient not taking: Reported on 10/14/2020) 18 g 5  . losartan (COZAAR) 50 MG tablet Take 1 tablet (50 mg total) by mouth daily. (Patient not taking: No sig reported)     No current facility-administered medications on file prior to visit.    Review of Systems:  As per HPI- otherwise negative.   Physical Examination: Vitals:   10/14/20 1053  BP: 126/82  Pulse: 65  Resp: 18  SpO2: 97%   Vitals:   10/14/20 1053  Weight: 178 lb (80.7 kg)  Height: 5\' 10"  (1.778 m)   Body mass index is 25.54 kg/m. Ideal Body Weight: Weight in (lb) to have BMI = 25: 173.9  GEN: no acute distress.  Normal weight, looks well HEENT: Atraumatic, Normocephalic.   Bilateral TM wnl, oropharynx normal.  PEERL,EOMI.   Ears and Nose: No external deformity. CV: RRR, No M/G/R. No JVD. No thrill. No extra heart sounds. PULM: CTA B, no wheezes, crackles, rhonchi. No retractions. No resp. distress. No accessory muscle use. EXTR: No c/c/e PSYCH: Normally interactive. Conversant.  Normal strength of bilateral upper and lower extremities except for mildly decreased grip strength.  I am unable to elicit positive carpal tunnel syndrome testing in either hand.  Reflexes upper and lower extremities Normal gait Normal cervical spine range of motion, no bony tenderness of the neck.  Soft tissue neck exam is normal   Assessment and Plan: Numbness in both hands - Plan: TSH, Vitamin B12, Folate, Ferritin  Screening for hyperlipidemia -  Plan: Lipid panel  Screening, deficiency anemia, iron - Plan: CBC, Ferritin  Screening for diabetes mellitus - Plan: Comprehensive metabolic panel, Hemoglobin A1c  Other secondary hypertension  Routine screening labs are pending as above Blood pressure looks good today.  I did suggest that he take 25 mg of losartan daily, as he describes some high blood pressure readings Main concern today is numbness in both hands along the median nerve distribution.  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  We discussed possibility of carpal tunnel syndrome.  Suggested that he use bilateral wrist splints especially at night for a couple of weeks.  We will also check for B12, folate, ferritin deficiency or thyroid issue as above If symptoms are not improved within 1 to 2 weeks we can proceed with imaging of his neck-he will let me know sooner if getting worse This visit occurred during the SARS-CoV-2 public health emergency.  Safety protocols were in place, including screening questions prior to the visit, additional usage of staff PPE, and extensive cleaning of exam room while observing appropriate contact time as indicated for disinfecting solutions.    Signed Lamar Blinks, MD  addnd 12/14- received his labs as below, message to pt  Results for orders placed or performed in visit on 10/14/20  CBC  Result Value Ref Range   WBC 10.5 4.0 - 10.5 K/uL   RBC 4.04 (L) 4.22 - 5.81 Mil/uL   Platelets 256.0 150.0 - 400.0 K/uL   Hemoglobin 14.4 13.0 - 17.0 g/dL   HCT 42.0 39.0 - 52.0 %   MCV 104.0 (H) 78.0 - 100.0 fl   MCHC 34.2 30.0 - 36.0 g/dL   RDW 15.0 11.5 - 15.5 %  Comprehensive metabolic panel  Result Value Ref Range   Sodium 139 135 - 145 mEq/L   Potassium 4.7 3.5 - 5.1 mEq/L   Chloride 102 96 - 112 mEq/L   CO2 32 19 - 32 mEq/L   Glucose, Bld 83 70 - 99 mg/dL   BUN 15 6 - 23 mg/dL   Creatinine, Ser 1.15 0.40 - 1.50 mg/dL   Total Bilirubin 0.4 0.2 - 1.2 mg/dL   Alkaline Phosphatase  61 39 - 117 U/L   AST 17 0 - 37 U/L   ALT 20 0 - 53 U/L   Total Protein 6.9 6.0 - 8.3 g/dL   Albumin 4.4 3.5 - 5.2 g/dL   GFR 84.81 >60.00 mL/min   Calcium 9.7 8.4 - 10.5 mg/dL  Lipid panel  Result Value Ref Range   Cholesterol 186 0 - 200 mg/dL   Triglycerides 137.0 0.0 - 149.0 mg/dL   HDL 30.70 (L) >39.00 mg/dL   VLDL 27.4 0.0 - 40.0 mg/dL   LDL Cholesterol 128 (H) 0 - 99 mg/dL   Total CHOL/HDL Ratio 6    NonHDL 155.24   TSH  Result Value Ref Range   TSH 3.33 0.35 - 4.50 uIU/mL  Vitamin B12  Result Value Ref Range   Vitamin B-12 >1526 (H) 211 - 911 pg/mL  Folate  Result Value Ref Range   Folate 15.5 >5.9 ng/mL  Ferritin  Result Value Ref Range   Ferritin 132.4 22.0 - 322.0 ng/mL  Hemoglobin A1c  Result Value Ref Range   Hgb A1c MFr Bld 5.4 4.6 - 6.5 %

## 2020-10-14 NOTE — Patient Instructions (Signed)
It was good to see you again today- I will be in touch with your labs asap For the time being my biggest suspicion would be carpal tunnel syndrome, probably caused by typing and computer use.  Please get a set of OTC wrist splints which prevent you from flexing your wrists; wear them at night while you sleep, and during the day as desired.  If you are not seeing improvement in a week or so please let me know (sooner if you are getting worse or have other symptoms!) and I can arrange imaging of your neck   Take care and happy holidays

## 2020-10-15 ENCOUNTER — Encounter: Payer: Self-pay | Admitting: Family Medicine

## 2021-01-06 DIAGNOSIS — Z20822 Contact with and (suspected) exposure to covid-19: Secondary | ICD-10-CM | POA: Diagnosis not present

## 2021-01-15 DIAGNOSIS — M9903 Segmental and somatic dysfunction of lumbar region: Secondary | ICD-10-CM | POA: Diagnosis not present

## 2021-01-15 DIAGNOSIS — M791 Myalgia, unspecified site: Secondary | ICD-10-CM | POA: Diagnosis not present

## 2021-01-15 DIAGNOSIS — M9901 Segmental and somatic dysfunction of cervical region: Secondary | ICD-10-CM | POA: Diagnosis not present

## 2021-01-15 DIAGNOSIS — M9902 Segmental and somatic dysfunction of thoracic region: Secondary | ICD-10-CM | POA: Diagnosis not present

## 2021-01-29 DIAGNOSIS — M791 Myalgia, unspecified site: Secondary | ICD-10-CM | POA: Diagnosis not present

## 2021-01-29 DIAGNOSIS — M9902 Segmental and somatic dysfunction of thoracic region: Secondary | ICD-10-CM | POA: Diagnosis not present

## 2021-01-29 DIAGNOSIS — M9901 Segmental and somatic dysfunction of cervical region: Secondary | ICD-10-CM | POA: Diagnosis not present

## 2021-01-29 DIAGNOSIS — M9903 Segmental and somatic dysfunction of lumbar region: Secondary | ICD-10-CM | POA: Diagnosis not present

## 2021-02-24 DIAGNOSIS — M9903 Segmental and somatic dysfunction of lumbar region: Secondary | ICD-10-CM | POA: Diagnosis not present

## 2021-02-24 DIAGNOSIS — M9901 Segmental and somatic dysfunction of cervical region: Secondary | ICD-10-CM | POA: Diagnosis not present

## 2021-02-24 DIAGNOSIS — M9902 Segmental and somatic dysfunction of thoracic region: Secondary | ICD-10-CM | POA: Diagnosis not present

## 2021-02-24 DIAGNOSIS — M791 Myalgia, unspecified site: Secondary | ICD-10-CM | POA: Diagnosis not present

## 2021-03-17 DIAGNOSIS — M9903 Segmental and somatic dysfunction of lumbar region: Secondary | ICD-10-CM | POA: Diagnosis not present

## 2021-03-17 DIAGNOSIS — M9902 Segmental and somatic dysfunction of thoracic region: Secondary | ICD-10-CM | POA: Diagnosis not present

## 2021-03-17 DIAGNOSIS — M791 Myalgia, unspecified site: Secondary | ICD-10-CM | POA: Diagnosis not present

## 2021-03-17 DIAGNOSIS — M9901 Segmental and somatic dysfunction of cervical region: Secondary | ICD-10-CM | POA: Diagnosis not present

## 2021-04-06 DIAGNOSIS — J1189 Influenza due to unidentified influenza virus with other manifestations: Secondary | ICD-10-CM | POA: Diagnosis not present

## 2021-04-24 DIAGNOSIS — L723 Sebaceous cyst: Secondary | ICD-10-CM | POA: Diagnosis not present

## 2021-04-24 DIAGNOSIS — L720 Epidermal cyst: Secondary | ICD-10-CM | POA: Diagnosis not present

## 2021-05-08 DIAGNOSIS — M791 Myalgia, unspecified site: Secondary | ICD-10-CM | POA: Diagnosis not present

## 2021-05-08 DIAGNOSIS — M9902 Segmental and somatic dysfunction of thoracic region: Secondary | ICD-10-CM | POA: Diagnosis not present

## 2021-05-08 DIAGNOSIS — M9901 Segmental and somatic dysfunction of cervical region: Secondary | ICD-10-CM | POA: Diagnosis not present

## 2021-05-08 DIAGNOSIS — M9903 Segmental and somatic dysfunction of lumbar region: Secondary | ICD-10-CM | POA: Diagnosis not present

## 2021-06-02 DIAGNOSIS — Z20822 Contact with and (suspected) exposure to covid-19: Secondary | ICD-10-CM | POA: Diagnosis not present

## 2021-06-25 DIAGNOSIS — L720 Epidermal cyst: Secondary | ICD-10-CM | POA: Diagnosis not present

## 2021-08-22 DIAGNOSIS — L720 Epidermal cyst: Secondary | ICD-10-CM | POA: Diagnosis not present

## 2021-09-29 ENCOUNTER — Other Ambulatory Visit: Payer: Self-pay

## 2021-09-29 ENCOUNTER — Ambulatory Visit: Payer: BC Managed Care – PPO | Admitting: Medical

## 2021-09-29 ENCOUNTER — Encounter: Payer: Self-pay | Admitting: Medical

## 2021-09-29 VITALS — BP 130/80 | HR 77 | Temp 98.3°F | Resp 18 | Ht 70.0 in | Wt 186.4 lb

## 2021-09-29 DIAGNOSIS — J029 Acute pharyngitis, unspecified: Secondary | ICD-10-CM

## 2021-09-29 DIAGNOSIS — J0100 Acute maxillary sinusitis, unspecified: Secondary | ICD-10-CM

## 2021-09-29 DIAGNOSIS — B349 Viral infection, unspecified: Secondary | ICD-10-CM | POA: Diagnosis not present

## 2021-09-29 DIAGNOSIS — R051 Acute cough: Secondary | ICD-10-CM | POA: Diagnosis not present

## 2021-09-29 DIAGNOSIS — R110 Nausea: Secondary | ICD-10-CM

## 2021-09-29 MED ORDER — AZITHROMYCIN 250 MG PO TABS
ORAL_TABLET | ORAL | 0 refills | Status: DC
Start: 2021-09-29 — End: 2021-09-29

## 2021-09-29 MED ORDER — AZITHROMYCIN 250 MG PO TABS: ORAL_TABLET | ORAL | 0 refills | Status: AC

## 2021-09-29 MED ORDER — BENZONATATE 100 MG PO CAPS: 100.0000 mg | ORAL_CAPSULE | Freq: Three times a day (TID) | ORAL | 0 refills | Status: DC | PRN

## 2021-09-29 MED ORDER — ONDANSETRON HCL 4 MG PO TABS: 4.0000 mg | ORAL_TABLET | Freq: Three times a day (TID) | ORAL | 0 refills | Status: DC | PRN

## 2021-09-29 NOTE — Progress Notes (Signed)
Subjective:    Patient ID: Stephen Castro, male    DOB: 01-13-1991, 30 y.o.   MRN: [id number]  HPI  Pt has had  cough, chills, vomiting and bodyaches. His bodyaches were severe but not the case now. He is overall feeling better today.    Pt stepson was sick with viral illness. He stepson was exposed to 5 family members who had the flu.   Pt is thinking he may have had the flu. 2 covid test have been negative.   Pt states today he is beginning to get better.   Pt states he will cough and then vomit afterwards.   He does report sinus pain. When coughs bringing up mucus.     Review of Systems  Constitutional:  Negative for chills, fatigue and fever.  HENT:  Positive for sinus pressure, sinus pain and sore throat. Negative for congestion, drooling, ear pain, hearing loss, mouth sores and postnasal drip.   Respiratory:  Negative for cough, chest tightness, shortness of breath and wheezing.   Cardiovascular:  Negative for chest pain and palpitations.  Gastrointestinal:  Positive for abdominal pain.       Mild epigastric pain.  Endocrine: Negative for polydipsia, polyphagia and polyuria.  Genitourinary:  Negative for dysuria, flank pain and frequency.  Musculoskeletal:  Negative for back pain, joint swelling, myalgias and neck pain.  Skin:  Negative for rash.  Neurological:  Negative for dizziness, speech difficulty, numbness and headaches.  Hematological:  Negative for adenopathy. Does not bruise/bleed easily.  Psychiatric/Behavioral:  Negative for behavioral problems, decreased concentration and hallucinations.     Past Medical History:  Diagnosis Date   Aneurysm ([place]) 2020   Anxiety    managed per pt   Depression    "haven't really dealt with" in "quite some time"   High blood pressure    slightly elevated     Social History   Socioeconomic History   Marital status: Married    Spouse name: Not on file   Number of children: Not on file   Years of education: Not on  file   Highest education level: Some college, no degree  Occupational History   Not on file  Tobacco Use   Smoking status: Every Day    Packs/day: 0.50    Types: Cigarettes   Smokeless tobacco: Never   Tobacco comments:    08/08/2019 update, trying to cut back   Vaping Use   Vaping Use: Former  Substance and Sexual Activity   Alcohol use: Yes    Comment: social   Drug use: Yes    Types: Marijuana   Sexual activity: Yes    Birth control/protection: Condom  Other Topics Concern   Not on file  Social History Narrative   Lives at home with his fiance [name]   Right handed   Caffeine: rarely a red bull, not daily. Coffee some, not daily.   Social Determinants of Health   Financial Resource Strain: Not on file  Food Insecurity: Not on file  Transportation Needs: Not on file  Physical Activity: Not on file  Stress: Not on file  Social Connections: Not on file  Intimate Partner Violence: Not on file    No past surgical history on file.  Family History  Problem Relation Age of Onset   Stroke Mother        carotid artery dissection during massage    Aneurysm Paternal Grandmother    Hypertension Other  both sides of family   Lung cancer Other        both sides of family    Cerebral aneurysm Maternal Uncle    Migraines Neg Hx        none that he is aware of    No Known Allergies  Current Outpatient Medications on File Prior to Visit  Medication Sig Dispense Refill   albuterol (VENTOLIN HFA) 108 (90 Base) MCG/ACT inhaler Inhale 2 puffs into the lungs every 6 (six) hours as needed for wheezing or shortness of breath. 18 g 5   cyclobenzaprine (FLEXERIL) 10 MG tablet Take 1 tablet (10 mg total) by mouth 2 (two) times daily as needed for muscle spasms. 20 tablet 0   losartan (COZAAR) 50 MG tablet Take 50 mg by mouth daily.     No current facility-administered medications on file prior to visit.    BP (!) 148/100   Pulse 77   Temp 98.3 F (36.8 C)   Resp 18    Ht 5\' 10"  (1.778 m)   Wt 186 lb 6.4 oz (84.6 kg)   SpO2 99%   BMI 26.75 kg/m       Objective:   Physical Exam  General- No acute distress. Pleasant patient. Neck- Full range of motion, no jvd Lungs- Clear, even and unlabored. Heart- regular rate and rhythm. Neurologic- CNII- XII grossly intact.  HEENT-frontal and maxillary sinus pressure palpation.  Canals clear normal TMs.  Posterior pharynx normal. Abdomen-faint epigastric tenderness to palpation.  Positive bowel sounds, no rebound or guarding.      Assessment & Plan:   Patient Instructions  Viral syndrome symptoms with exposure to flu.  Symptom onset and exposure was about 1 week ago.  Recent COVID test x2 were both negative.  Work-up negative.  Feeling some better but cough, sore throat and now having maxillary area sinus pressure.  We will treat for bronchitis and sinus infection occurring post viral illness.  At this point I do not see benefit of testing for flu as you are 6 days post onset of symptoms.  Rest, hydrate and can take Tylenol for fever.  Rx azithromycin sent to pharmacy.  Benzonatate prescribed for cough and Zofran prescribed for any nausea or vomiting.   Return to work note for Friday provided feeling better.  If signs and symptoms persist will change consider getting labs as discussed.  Follow-up appointment scheduled with your PCP for Thursday but if you are feeling significantly better by Wednesday you stated you would cancel her appointment.

## 2021-09-29 NOTE — Patient Instructions (Addendum)
Viral syndrome symptoms with exposure to flu.  Symptom onset and exposure was about 1 week ago.  Recent COVID test x2 were both negative.  Work-up negative.  Feeling some better but cough, sore throat and now having maxillary area sinus pressure.  Will treat for bronchitis and sinus infection occurring post viral illness.  At this point I do not see benefit of testing for flu as you are 6 days post onset of symptoms.  Rest, hydrate and can take Tylenol for fever.  Rx azithromycin sent to pharmacy.  Benzonatate prescribed for cough and Zofran prescribed for any nausea or vomiting.   Return to work note for Friday provided feeling better.  If signs and symptoms persist will change consider getting labs as discussed.  Follow-up appointment scheduled with your PCP for Thursday but if you are feeling significantly better by Wednesday you stated you would cancel her appointment.

## 2021-10-02 ENCOUNTER — Ambulatory Visit: Payer: BC Managed Care – PPO | Admitting: Family Medicine

## 2021-10-30 DIAGNOSIS — Z03818 Encounter for observation for suspected exposure to other biological agents ruled out: Secondary | ICD-10-CM | POA: Diagnosis not present

## 2021-10-30 DIAGNOSIS — R21 Rash and other nonspecific skin eruption: Secondary | ICD-10-CM | POA: Diagnosis not present

## 2021-10-30 DIAGNOSIS — R52 Pain, unspecified: Secondary | ICD-10-CM | POA: Diagnosis not present

## 2021-10-30 DIAGNOSIS — R509 Fever, unspecified: Secondary | ICD-10-CM | POA: Diagnosis not present

## 2021-10-30 DIAGNOSIS — R519 Headache, unspecified: Secondary | ICD-10-CM | POA: Diagnosis not present

## 2022-02-18 ENCOUNTER — Encounter: Payer: Self-pay | Admitting: Neurology

## 2022-02-18 ENCOUNTER — Ambulatory Visit (INDEPENDENT_AMBULATORY_CARE_PROVIDER_SITE_OTHER): Payer: BC Managed Care – PPO | Admitting: Neurology

## 2022-02-18 VITALS — BP 132/85 | HR 70 | Ht 70.0 in | Wt 173.0 lb

## 2022-02-18 DIAGNOSIS — I671 Cerebral aneurysm, nonruptured: Secondary | ICD-10-CM

## 2022-02-18 NOTE — Progress Notes (Signed)
?[facility] ?[address] ?[place]. [place] [address] ?[phone] ? ?     OFFICE CONSULT NOTE ? ?[name] ?Date of Birth:  December 05, 1990 ?Medical Record Number:  [id number]  ? ?Referring MD: Stephen Castro ?Reason for Referral: Headache and brain aneurysm ? ?HPI: Initial visit 7/16/2020[name] is a pleasant 31 year old Caucasian male seen today for initial office consultation visit for headache and brain aneurysm.  History is obtained from the patient and his mother was accompanying him as well as review of electronic medical records and have personally reviewed imaging films in PACS.  He states that he has had remote history of migraine headaches but about a month ago he noticed a strain in his neck when he was lifting something.  His neck was sore for a few days and then as that feeling improved his started noticing intermittent headaches.  The headaches start at the back of the neck usually and then radiate up to the vertex and occasionally to the front.  Headaches are throbbing in nature and moderate to severe in intensity.  He denies accompanying nausea vomiting light or sound sensitivity but when the headache is severely has to lie down.  He points to a spot in the back of his neck on the left which is tender and is often the starting point of his headaches.  He did not find any relief by taking over-the-counter analgesics like Tylenol or Motrin.  He has been taking Fioricet which provides modest relief as well as Flexeril half a tablet which puts him to sleep.  He has been taking both tablets on a daily basis for the last several weeks.  He was seen in emergency room on 04/23/2019 and had a CT scan of the head which was unremarkable.  He was found to have evidence of sinusitis on the CT scan and was discharged on a course of amoxicillin which she finished and did not find any benefit in his headaches.  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  Otherwise no significant stenosis or narrowing of the large intracranial vessels.  Patient does have a strong family history of aneurysms on both the father and mother side.  Patient states in the last week or so the headache may be partially improving but not gone yet.  He does state that he is felt a little emotional and low because of the headaches persisting for so long.  He was referred to see Stephen Castro. Kathyrn Castro endovascular neurosurgeon who recommended conservative follow-up of the infundibulum/aneurysm with surveillance MRAs at 1 or 2-year interval studies. ?Update 02/18/2022 : He returns for f/u after last visit with me 2.5 years ago. He is accompanied by his wife. He is doing well without any neurological complaints.he had CTangio and MRA in 2020 which have shown infundibulum versus small aneurysm and given his strong family history he wants surveillance. He denies any recent migraines or headaches though he occasionally gets minor headaches which are not disabling.he had seen Stephen Castro Stephen Castro in July 2020 for his migraines and prescribed Ajovy which worked well but he has not used it now in more than 2 years. ?ROS:   ?14 system review of systems is positive for headache, neck pain, restriction of neck movements,  anxiety, sinusitis and all other systems negative ? ?PMH:  ?Past Medical History:  ?Diagnosis Date  ? Aneurysm ([place]) 2020  ? Anxiety   ? managed per pt  ? Depression   ? "haven't really dealt with" in "quite some time"  ? High blood pressure   ? slightly elevated  ? ? ?Social History:  ?Social History  ? ?Socioeconomic History  ? Marital status: Married  ?  Spouse name: Not on file  ? Number of children: Not on file  ? Years of education: Not on file  ? Highest education level: Some college, no degree   ?Occupational History  ? Not on file  ?Tobacco Use  ? Smoking status: Every Day  ?  Packs/day: 0.50  ?  Types: Cigarettes  ? Smokeless tobacco: Never  ? Tobacco comments:  ?  08/08/2019 update, trying to cut back   ?Vaping Use  ? Vaping Use: Former  ?Substance and Sexual Activity  ? Alcohol use: Yes  ?  Comment: social  ? Drug use: Yes  ?  Types: Marijuana  ? Sexual activity: Yes  ?  Birth control/protection: Condom  ?Other Topics Concern  ? Not on file  ?Social History Narrative  ? Lives at home with his fiance Stephen Castro  ? Right handed  ? Caffeine: rarely a red bull, not daily. Coffee some, not daily.  ? ?Social Determinants of Health  ? ?Financial Resource Strain: Not on file  ?Food Insecurity: Not on file  ?Transportation Needs: Not on file  ?Physical Activity: Not on file  ?Stress: Not on file  ?Social Connections: Not on file  ?Intimate Partner Violence: Not on file  ? ? ?Medications:   ?No current outpatient medications on file prior to visit.  ? ?No current facility-administered medications on file prior to visit.  ? ? ?Allergies:  No Known Allergies ? ?Physical Exam ?General: well developed, well nourished, seated, in no evident distress ?Head: head normocephalic and atraumatic.   ?Neck: supple with no carotid or supraclavicular bruits ?Cardiovascular: regular rate and rhythm, no murmurs ?Musculoskeletal: no deformity.  Point tenderness left posterior cervical region.  Mild restriction of neck extension and rotation to the left. ?Skin:  no rash/petichiae ?Vascular:  Normal pulses all extremities ? ?Neurologic Exam ?Mental Status: Awake and fully alert. Oriented to place and time. Recent and remote memory intact. Attention span, concentration and fund of knowledge appropriate. Mood and affect appropriate.  ?Cranial Nerves: Fundoscopic exam not done.. Pupils equal, briskly reactive to light. Extraocular movements full without nystagmus. Visual fields full to confrontation. Hearing intact. Facial sensation intact.  Face, tongue, palate moves normally and symmetrically.  ?Motor: Normal bulk and tone. Normal strength in all tested extremity muscles. ?Sensory.: intact to touch , pinprick , position and vibratory sensation.  ?Coordination: Rapid alternating movements normal in all extremities. Finger-to-nose and heel-to-shin performed accurately bilaterally. ?Gait and Station: Arises from chair without difficulty. Stance is normal. Gait demonstrates normal stride length and balance . Able to heel, toe and tandem walk without difficulty.  ?Reflexes: 1+ and symmetric. Toes downgoing.  ? ?  ? ? 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  MRI of the brain shows tiny incidental 2 to 3 mm dilatation in the cavernous carotid possibly infundibulum versus small aneurysm.  He has a strong family history of intracranial aneurysms. ? ? ? ?PLAN: ?I had a long discussion with the patient and his wife regarding his asymptomatic small  cerebral aneurysm and discussed risk of rupture and risk factors and answered questions.  I recommend follow-up MR angiogram as patient is reluctant to do a CT angiogram because of dye injection for surveillance.  Advised him to maintain strict control of hypertension and to quit smoking as well as avoid drugs of abuse.  He also has migraines which seem to be quite infrequent and well controlled at the present time and does not need specific medicines for the same.  He is was advised to follow-up with Stephen Castro. Jaynee Castro for his migraines if needed no routine schedule appointment with me is necessary. Greater than 50% time during this 35-minute   visit was spent on counseling and coordination of care about his tension headaches as well as small   intracranial aneurysm , risk for rupture and risk factors and answered questions. ?Antony Contras, MD ? ?[facility] ?[place], [place]  [address] ? ?Phone [phone] Fax [phone] ?Note: This document was prepared with digital dictation and possible smart phrase technology. Any transcriptional errors that result from this process are unintentio

## 2022-02-18 NOTE — Patient Instructions (Signed)
I had a long discussion with the patient and his wife regarding his asymptomatic small cerebral aneurysm and discussed risk of rupture and risk factors and answered questions.  I recommend follow-up MR angiogram as patient is reluctant to do a CT angiogram because of dye injection for surveillance.  Advised him to maintain strict control of hypertension and to quit smoking as well as avoid drugs of abuse.  He also has migraines which seem to be quite infrequent and well controlled at the present time and does not need specific medicines for the same.  He is was advised to follow-up with Dr. Jaynee Eagles for his migraines if needed no routine schedule appointment with me is necessary. ?Cerebral Aneurysm ? ?A cerebral aneurysm is a bulge that occurs in a blood vessel (artery) inside the brain. An aneurysm is caused when a weakened part of the blood vessel expands. The blood vessel expands due to the constant pressure from the flow of blood through the weakened blood vessel. As the aneurysm expands, the walls of the aneurysm become weaker. ?Aneurysms are dangerous because they can leak or burst (rupture). When a cerebral aneurysm ruptures, it causes bleeding in the brain (subarachnoid hemorrhage). The blood flow to the area of the brain supplied by the artery is also reduced. This can cause a stroke, seizures, or a coma. ?A ruptured cerebral aneurysm is a medical emergency. This can cause permanent brain damage or death. ?What are the causes? ?The exact cause of this condition is not known. ?What increases the risk? ?The following factors may make you more likely to develop this condition: ?Being older. The condition is most common in people between the ages of 60 and 53. ?Being male. ?Having a family history of aneurysm in two or more direct relatives. ?Having certain conditions that are passed along from parent to child (inherited). They include: ?Autosomal dominant polycystic kidney disease. This is a condition in which  small, fluid-filled sacs (cysts) develop in the kidney. ?Neurofibromatosis type 1. In this condition, flat spots develop under the skin (pigmentation) and tumors grow along nerves in the skin, brain, and other parts of the body. ?Ehlers-Danlos syndrome. This is a condition in which bad connective tissue causes loose or unstable joints and creates a very soft skin that bruises or tears easily. ?Smoking. ?Having high blood pressure (hypertension). ?Abusing alcohol. ?What are the signs or symptoms? ?The symptoms of a cerebral aneurysm that has not leaked or ruptured can depend on its size and rate of growth. A small, unchanging aneurysm generally does not cause symptoms. A larger aneurysm that is steadily growing can increase pressure on the brain or nerves. This increased pressure can cause: ?A headache. ?Vision problems. ?Numbness or weakness in an arm or leg. ?Memory problems. ?Problems speaking. ?Seizures. ?If an aneurysm leaks or ruptures, it can cause a life-threatening condition, such as a stroke. Symptoms may include: ?A sudden, severe headache with no known cause. The headache is often described as the worst headache ever experienced. ?Stiff neck. ?Nausea or vomiting, especially when combined with other symptoms, such as a headache. ?Sudden weakness or numbness of the face, arm, or leg, especially on one side of the body. ?Sudden trouble walking or difficulty moving the arms or legs. ?Double vision or sudden trouble seeing in one or both eyes. ?Trouble speaking or understanding speech. ?Trouble swallowing. ?Dizziness. ?Loss of balance or coordination. ?Intolerance to light. ?Sudden confusion or loss of consciousness. ?How is this diagnosed? ?This condition is diagnosed using certain tests, including: ?CT  scan. ?Computed tomographic angiogram (CTA). This test uses a dye and a scanner to produce images of your blood vessels. ?Magnetic resonance angiogram (MRA). This test uses an MRI machine to produce images of  your blood vessels. ?Digital subtraction angiogram (DSA). This test involves placing a long, thin tube (catheter) into the artery in your thigh and guiding it up to the arteries in the brain. A dye is then injected into the area, and X-rays are taken to create images of your blood vessels. ?How is this treated? ?Unruptured aneurysm ?Treatment for an aneurysm that is not causing problems will depend on many factors, such as the size and location of the aneurysm, your age, your overall health, and your preferences. Small aneurysms in certain locations of the brain have a very low chance of bleeding or rupturing. These small aneurysms may not need to be treated. Your health care provider may monitor the aneurysm regularly to check for any changes. ?In some cases, however, treatment may be required because of the size or location of an aneurysm. Treatment options may include: ?Coiling. During this procedure, a catheter is inserted and advanced through a blood vessel. Once the catheter reaches the aneurysm, tiny coils are used to block blood flow into the aneurysm. This procedure is sometimes done at the same time as a DSA. ?Surgical clipping. During surgery, a clip is placed at the base of the aneurysm. The clip prevents blood from continuing to enter the aneurysm. ?Flow diversion. This procedure is used to divert blood flow around the aneurysm with a stent that is placed across the opening of an aneurysm. ?Ruptured aneurysm ?For a ruptured aneurysm, emergency surgery or coiling is often needed right away to help prevent damage to the brain and to reduce the risk of rebleeding. ?Follow these instructions at home: ?If your aneurysm is not treated: ?Take over-the-counter and prescription medicines only as told by your health care provider. ?Follow a diet suggested by your health care provider. Certain dietary changes may be advised to address hypertension, such as choosing foods that are low in salt (sodium), saturated  fat, trans fat, and cholesterol. ?Stay physically active. Try to get at least 30 minutes of activity on most or all days of the week. ?Do not use any products that contain nicotine or tobacco. These products include cigarettes, chewing tobacco, and vaping devices, such as e-cigarettes. If you need help quitting, ask your health care provider. ?If you drink alcohol: ?Limit how much you have to: ?0-1 drink a day for women who are not pregnant. ?0-2 drinks a day for men. ?Know how much alcohol is in your drink. In the [place] one drink equals one 12 oz bottle of beer (355 mL), one 5 oz glass of wine (148 mL), or one 1? oz glass of hard liquor (44 mL). ?Do not use drugs. If you need help quitting, ask your health care provider. ?Keep all follow-up visits. This is important. This includes any referrals, imaging studies, and lab tests. Proper follow-up may prevent an aneurysm rupture or a stroke. ?Get help right away if: ?You have a sudden, severe headache with no known cause. This may include a stiff neck. ?You have sudden nausea or vomiting with a severe headache. ?You have a seizure. ?You have other symptoms of a stroke. "BE FAST" is an easy way to remember the main warning signs of a stroke: ?B - Balance. Signs are dizziness, sudden trouble walking, or loss of balance. ?E - Eyes. Signs are trouble seeing or  a sudden change in vision. ?F - Face. Signs are sudden weakness or numbness of the face, or the face or eyelid drooping on one side. ?A - Arms. Signs are weakness or numbness in an arm. This happens suddenly and usually on one side of the body. ?S - Speech. Signs are sudden trouble speaking, slurred speech, or trouble understanding what people say. ?T - Time. Time to call emergency services. Write down what time symptoms started. ?These symptoms may represent a serious problem that is an emergency. Do not wait to see if the symptoms will go away. Get medical help right away. Call your local emergency services (911 in  the [place] Do not drive yourself to the hospital. ?Summary ?A cerebral aneurysm is a bulge that occurs in a blood vessel (artery) inside the brain. ?Aneurysms are dangerous because they can leak or bur

## 2022-02-26 ENCOUNTER — Telehealth: Payer: Self-pay | Admitting: Neurology

## 2022-02-26 NOTE — Telephone Encounter (Signed)
02/26/22 vm full sent mychart message to call back to schedule ? ? Dillon Bjork: [phone] (exp. 02/23/22 to 03/24/22)  ?

## 2022-07-02 ENCOUNTER — Ambulatory Visit: Payer: BC Managed Care – PPO | Admitting: Sports Medicine

## 2022-07-02 ENCOUNTER — Ambulatory Visit (INDEPENDENT_AMBULATORY_CARE_PROVIDER_SITE_OTHER): Payer: BC Managed Care – PPO

## 2022-07-02 ENCOUNTER — Encounter: Payer: Self-pay | Admitting: Family

## 2022-07-02 ENCOUNTER — Other Ambulatory Visit: Payer: Self-pay | Admitting: Family

## 2022-07-02 ENCOUNTER — Ambulatory Visit: Payer: BC Managed Care – PPO | Admitting: Family

## 2022-07-02 VITALS — Ht 70.0 in | Wt 193.0 lb

## 2022-07-02 VITALS — BP 178/120 | HR 84 | Temp 97.7°F | Ht 70.0 in | Wt 193.0 lb

## 2022-07-02 DIAGNOSIS — M542 Cervicalgia: Secondary | ICD-10-CM

## 2022-07-02 DIAGNOSIS — M9908 Segmental and somatic dysfunction of rib cage: Secondary | ICD-10-CM

## 2022-07-02 DIAGNOSIS — M62838 Other muscle spasm: Secondary | ICD-10-CM

## 2022-07-02 DIAGNOSIS — M9902 Segmental and somatic dysfunction of thoracic region: Secondary | ICD-10-CM

## 2022-07-02 DIAGNOSIS — M9901 Segmental and somatic dysfunction of cervical region: Secondary | ICD-10-CM | POA: Diagnosis not present

## 2022-07-02 DIAGNOSIS — I158 Other secondary hypertension: Secondary | ICD-10-CM

## 2022-07-02 MED ORDER — MELOXICAM 15 MG PO TABS: 15.0000 mg | ORAL_TABLET | Freq: Every day | ORAL | 0 refills | Status: DC

## 2022-07-02 MED ORDER — METHOCARBAMOL 500 MG PO TABS: 500.0000 mg | ORAL_TABLET | Freq: Three times a day (TID) | ORAL | 0 refills | Status: DC | PRN

## 2022-07-02 MED ORDER — CYCLOBENZAPRINE HCL 5 MG PO TABS: 5.0000 mg | ORAL_TABLET | Freq: Three times a day (TID) | ORAL | 0 refills | Status: DC | PRN

## 2022-07-02 MED ORDER — AMLODIPINE BESYLATE 10 MG PO TABS: 10.0000 mg | ORAL_TABLET | Freq: Every day | ORAL | 1 refills | Status: DC

## 2022-07-02 NOTE — Progress Notes (Signed)
Stephen Castro is a 31 y.o. male with the following history as recorded in [employer]:  Patient Active Problem List   Diagnosis Date Noted   Chronic migraine without aura without status migrainosus, not intractable 08/08/2019   Hypertension 06/30/2019   Brain aneurysm 06/17/2019   Gastroenteritis 04/13/2019   Shoulder injury, left, initial encounter 01/24/2018   ADHD (attention deficit hyperactivity disorder) 04/04/2013   Anxiety state, unspecified 04/04/2013    Current Outpatient Medications  Medication Sig Dispense Refill   methocarbamol (ROBAXIN) 500 MG tablet Take 1 tablet (500 mg total) by mouth every 8 (eight) hours as needed for muscle spasms. 30 tablet 0   amLODipine (NORVASC) 10 MG tablet TAKE 1 TABLET(10 MG) BY MOUTH DAILY 90 tablet 0   No current facility-administered medications for this visit.    Allergies: Patient has no known allergies.  Past Medical History:  Diagnosis Date   Aneurysm ([place]) 2020   Anxiety    managed per pt   Depression    "haven't really dealt with" in "quite some time"   High blood pressure    slightly elevated    No past surgical history on file.  Family History  Problem Relation Age of Onset   Stroke Mother        carotid artery dissection during massage    Aneurysm Paternal Grandmother    Hypertension Other        both sides of family   Lung cancer Other        both sides of family    Cerebral aneurysm Maternal Uncle    Migraines Neg Hx        none that he is aware of    Social History   Tobacco Use   Smoking status: Every Day    Packs/day: 0.50    Types: Cigarettes   Smokeless tobacco: Never   Tobacco comments:    08/08/2019 update, trying to cut back   Substance Use Topics   Alcohol use: Yes    Comment: social    Subjective:   Neck and low back pain x 2-3 days; started "out of the blue." Using wife's muscle relaxer with some relief; taking OTC Ibuprofen with relief; has been helping to take care of his wife;  History of  hypertension- has taken Amlodipine in the past; not currently taking his medication;     Objective:  Vitals:   07/02/22 0933 07/02/22 0951  BP: (!) 180/120 (!) 178/120  Pulse: 84   Temp: 97.7 F (36.5 C)   TempSrc: Oral   SpO2: 98%   Weight: 193 lb (87.5 kg)   Height: '5\' 10"'$  (1.778 m)     General: Well developed, well nourished, in mild distress Skin : Warm and dry.  Head: Normocephalic and atraumatic  Lungs: Respirations unlabored;  Musculoskeletal: No deformities; limited range of motion in cervical spine;  Extremities: No edema, cyanosis, clubbing  Vessels: Symmetric bilaterally  Neurologic: Alert and oriented; speech intact; face symmetrical;   Assessment:  1. Neck pain   2. Other secondary hypertension     Plan:  Due to presenting symptoms in office, question if he could benefit from trigger point injection; do not feel comfortable with Toradol or NSAIDs based on blood pressure readings today; he is scheduled to see sports medicine this afternoon; Known history of hypertension- per patient, "have not been on my medication for a while." Will re-start Amlodipine 10 mg daily and stressed need for him to follow up with his PCP in  2 weeks for re-check/ follow up.   Return in about 2 weeks (around 07/16/2022) for with Dr. Lorelei Pont blood pressure follow up.  No orders of the defined types were placed in this encounter.   Requested Prescriptions   Signed Prescriptions Disp Refills   methocarbamol (ROBAXIN) 500 MG tablet 30 tablet 0    Sig: Take 1 tablet (500 mg total) by mouth every 8 (eight) hours as needed for muscle spasms.

## 2022-07-02 NOTE — Patient Instructions (Addendum)
-   Start meloxicam 15 mg daily x2 weeks.  If still having pain after 2 weeks, complete 3rd-week of meloxicam. May use remaining meloxicam as needed once daily for pain control.  Do not to use additional NSAIDs while taking meloxicam.  May use Tylenol [phone] mg 2 to 3 times a day for breakthrough pain.  Flexeril 1-2 tablets every 8 hours as needed  Do prescribed exercises at least 3x a week Follow up next Tuesday

## 2022-07-02 NOTE — Progress Notes (Signed)
Stephen Castro D.[place] Phone: [phone]   Assessment and Plan:     1. Neck pain 2. Cervical paraspinal muscle spasm 3. Somatic dysfunction of cervical region 4. Somatic dysfunction of thoracic region 5. Somatic dysfunction of rib region -Acute, uncertain prognosis, initial sports medicine visit - Patient presenting with severe left-sided neck pain most consistent with paraspinal muscular spasms without significant MOI - X-ray obtained in clinic.  My interpretation: No acute fracture or vertebral collapse.  Mild loss of disc height at C6.  Loss of normal cervical lordosis - Start meloxicam 15 mg daily x2 weeks.  If still having pain after 2 weeks, complete 3rd-week of meloxicam. May use remaining meloxicam as needed once daily for pain control.  Do not to use additional NSAIDs while taking meloxicam.  May use Tylenol [id number] mg 2 to 3 times a day for breakthrough pain. - Start Flexeril 5 mg 1 to 2 tablets every 8 hours as needed for muscle spasms - Patient elected for initial OMT today.  Tolerated well per note below. - Decision today to treat with OMT was based on Physical Exam  After verbal consent patient was treated with   ME (muscle energy), FPR (flex positional release), ST (soft tissue),  echniques in cervical, rib, thoracic,  areas. Patient was in discomfort throughout examination, however tolerated the procedure well with improvement in ROM.  Patient educated on potential side effects of soreness and recommended to rest, hydrate, and use Tylenol as needed for pain control.   Other orders - meloxicam (MOBIC) 15 MG tablet; Take 1 tablet (15 mg total) by mouth daily. - cyclobenzaprine (FLEXERIL) 5 MG tablet; Take 1 tablet (5 mg total) by mouth every 8 (eight) hours as needed for muscle spasms.    Pertinent previous records reviewed include none   Follow Up: 5 days for reevaluation.  Could consider  repeat OMT if patient found it beneficial.  If patient calls back tomorrow and medication and manipulation of provided no benefit, we could use a short-term course of tramadol for pain relief.   Subjective:   I, Pincus Badder, am serving as a Education administrator for Doctor Glennon Mac  Chief Complaint: neck pain   HPI:   07/02/22 Patient is a 31 year old male complaining of neck pain. Patient states  Relevant Historical Information: 48 hours ago locked up on him has been getting progressively worse. Feels like lightning going down to hand occasionally. Started on the left moving to the right. Very limited ROM. Can only sleep for about 20 mins at a time. Drinking water helps. All interventions have not helped.  Additional pertinent review of systems negative.   Current Outpatient Medications:    cyclobenzaprine (FLEXERIL) 5 MG tablet, Take 1 tablet (5 mg total) by mouth every 8 (eight) hours as needed for muscle spasms., Disp: 30 tablet, Rfl: 0   meloxicam (MOBIC) 15 MG tablet, Take 1 tablet (15 mg total) by mouth daily., Disp: 30 tablet, Rfl: 0   amLODipine (NORVASC) 10 MG tablet, TAKE 1 TABLET(10 MG) BY MOUTH DAILY, Disp: 90 tablet, Rfl: 0   methocarbamol (ROBAXIN) 500 MG tablet, Take 1 tablet (500 mg total) by mouth every 8 (eight) hours as needed for muscle spasms., Disp: 30 tablet, Rfl: 0   Objective:     Vitals:   07/02/22 1443  Weight: 193 lb (87.5 kg)  Height: '5\' 10"'$  (1.778 m)      Body mass index  is 27.69 kg/m.    Physical Exam:    General: Well-appearing, cooperative, sitting comfortably in no acute distress.   OMT Physical Exam:   Cervical: TTP paraspinal, C3-6 RLSL Rib: Bilateral elevated first rib with severely TTP to left Thoracic: TTP paraspinal, T3-6 RRSL  Cervical Spine: Posture normal Skin: normal, intact  Neurological:   Strength:  Right  Left   Deltoid 5/5 5/5  Bicep 5/5  5/5  Tricep 5/5 5/5  Wrist Flexion 5/5 5/5  Wrist Extension 5/5 5/5  Grip 5/5  5/5  Finger Abduction 5/5 5/5   Sensation: intact to light touch in upper extremities bilaterally  Spurling's:  negative bilaterally Neck ROM: Significantly reduced ROM in all directions TTP: Significantly to left-sided cervical paraspinous muscles, mildly to cervical spinous processes C5-C7, right cervical paraspinal, and left trapezius NTTP: thoracic paraspinal, right trapezius      Electronically signed by:  Stephen Castro D.[facility] 3:51 PM 07/02/22

## 2022-07-03 NOTE — Progress Notes (Unsigned)
    Stephen Castro D.[place] Phone: [phone]   Assessment and Plan:     There are no diagnoses linked to this encounter.  ***   Pertinent previous records reviewed include ***   Follow Up: ***     Subjective:   I, Stephen Castro, am serving as a Education administrator for Doctor Glennon Mac   Chief Complaint: neck pain    HPI:    07/02/22 Patient is a 31 year old male complaining of neck pain. Patient states   Relevant Historical Information: 48 hours ago locked up on him has been getting progressively worse. Feels like lightning going down to hand occasionally. Started on the left moving to the right. Very limited ROM. Can only sleep for about 20 mins at a time. Drinking water helps. All interventions have not helped.  07/07/2022 Patient states   Additional pertinent review of systems negative.   Current Outpatient Medications:    amLODipine (NORVASC) 10 MG tablet, TAKE 1 TABLET(10 MG) BY MOUTH DAILY, Disp: 90 tablet, Rfl: 0   cyclobenzaprine (FLEXERIL) 5 MG tablet, Take 1 tablet (5 mg total) by mouth every 8 (eight) hours as needed for muscle spasms., Disp: 30 tablet, Rfl: 0   meloxicam (MOBIC) 15 MG tablet, Take 1 tablet (15 mg total) by mouth daily., Disp: 30 tablet, Rfl: 0   methocarbamol (ROBAXIN) 500 MG tablet, Take 1 tablet (500 mg total) by mouth every 8 (eight) hours as needed for muscle spasms., Disp: 30 tablet, Rfl: 0   Objective:     There were no vitals filed for this visit.    There is no height or weight on file to calculate BMI.    Physical Exam:    ***   Electronically signed by:  Stephen Castro D.[facility] 7:50 AM 07/03/22

## 2022-07-07 ENCOUNTER — Ambulatory Visit: Payer: BC Managed Care – PPO | Admitting: Sports Medicine

## 2022-07-07 VITALS — BP 138/78 | HR 102 | Ht 70.0 in | Wt 193.0 lb

## 2022-07-07 DIAGNOSIS — M9902 Segmental and somatic dysfunction of thoracic region: Secondary | ICD-10-CM | POA: Diagnosis not present

## 2022-07-07 DIAGNOSIS — M9908 Segmental and somatic dysfunction of rib cage: Secondary | ICD-10-CM | POA: Diagnosis not present

## 2022-07-07 DIAGNOSIS — M62838 Other muscle spasm: Secondary | ICD-10-CM | POA: Diagnosis not present

## 2022-07-07 DIAGNOSIS — M542 Cervicalgia: Secondary | ICD-10-CM

## 2022-07-07 DIAGNOSIS — M9901 Segmental and somatic dysfunction of cervical region: Secondary | ICD-10-CM | POA: Diagnosis not present

## 2022-07-07 NOTE — Patient Instructions (Addendum)
Good to see you  Continue HEP Continue meloxicam  2 week follow up

## 2022-07-16 NOTE — Progress Notes (Deleted)
   Stephen Castro D.[place] Phone: [phone]   Assessment and Plan:     There are no diagnoses linked to this encounter.  *** - Patient has received significant relief with OMT in the past.  Elects for repeat OMT today.  Tolerated well per note below. - Decision today to treat with OMT was based on Physical Exam   After verbal consent patient was treated with HVLA (high velocity low amplitude), ME (muscle energy), FPR (flex positional release), ST (soft tissue), PC/PD (Pelvic Compression/ Pelvic Decompression) techniques in cervical, rib, thoracic, lumbar, and pelvic areas. Patient tolerated the procedure well with improvement in symptoms.  Patient educated on potential side effects of soreness and recommended to rest, hydrate, and use Tylenol as needed for pain control.   Pertinent previous records reviewed include ***   Follow Up: ***     Subjective:   I, Stephen Castro, am serving as a Education administrator for Doctor Glennon Mac   Chief Complaint: neck pain    HPI:    07/02/22 Patient is a 31 year old male complaining of neck pain. Patient states   Relevant Historical Information: 48 hours ago locked up on him has been getting progressively worse. Feels like lightning going down to hand occasionally. Started on the left moving to the right. Very limited ROM. Can only sleep for about 20 mins at a time. Drinking water helps. All interventions have not helped.   07/07/2022 Patient states neck is still in pain better than when he came in   07/21/2022 Patient states   Relevant Historical Information: ***  Additional pertinent review of systems negative.  Current Outpatient Medications  Medication Sig Dispense Refill   amLODipine (NORVASC) 10 MG tablet TAKE 1 TABLET(10 MG) BY MOUTH DAILY 90 tablet 0   cyclobenzaprine (FLEXERIL) 5 MG tablet Take 1 tablet (5 mg total) by mouth every 8 (eight) hours as needed for muscle  spasms. 30 tablet 0   meloxicam (MOBIC) 15 MG tablet Take 1 tablet (15 mg total) by mouth daily. 30 tablet 0   methocarbamol (ROBAXIN) 500 MG tablet Take 1 tablet (500 mg total) by mouth every 8 (eight) hours as needed for muscle spasms. 30 tablet 0   No current facility-administered medications for this visit.      Objective:     There were no vitals filed for this visit.    There is no height or weight on file to calculate BMI.    Physical Exam:     General: Well-appearing, cooperative, sitting comfortably in no acute distress.   OMT Physical Exam:  ASIS Compression Test: Positive Right Cervical: TTP paraspinal, *** Rib: Bilateral elevated first rib with TTP Thoracic: TTP paraspinal,*** Lumbar: TTP paraspinal,*** Pelvis: Right anterior innominate  Electronically signed by:  Stephen Castro D.[facility] 10:08 AM 07/16/22

## 2022-07-17 NOTE — Progress Notes (Deleted)
[place] at [facility] [address], [place], [place] [address] [phone]  Date:  07/20/2022   Name:  Stephen Castro   DOB:  [birth year]   MRN:  [id number]  PCP:  Darreld Mclean, MD    Chief Complaint: No chief complaint on file.   History of Present Illness:  Stephen Castro is a 31 y.o. 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  MRI of the brain shows tiny incidental 2 to 3 mm dilatation in the cavernous carotid possibly infundibulum versus small aneurysm.  He has a strong family history of intracranial aneurysms. PLAN: I had a long discussion with the patient and his wife regarding his asymptomatic small cerebral aneurysm and discussed risk of rupture and risk factors and answered questions.  I recommend follow-up MR angiogram as patient is reluctant to do a CT angiogram because of dye injection for surveillance.  Advised him to maintain strict control of hypertension and to quit smoking as well as avoid drugs of abuse.  He also has migraines which seem to be quite infrequent and well controlled at the present time and does not need specific medicines for the same.  He is was advised to follow-up with Dr. Jaynee Eagles for his migraines if needed no routine schedule appointment with me is necessary. Greater than 50% time during this 35-minute   visit was spent on counseling and coordination of care about his tension headaches as well as small   intracranial aneurysm , risk for rupture and risk factors and answered questions.  Patient Active Problem List   Diagnosis Date Noted   Chronic migraine without aura without status migrainosus, not intractable 08/08/2019   Hypertension  06/30/2019   Brain aneurysm 06/17/2019   Gastroenteritis 04/13/2019   Shoulder injury, left, initial encounter 01/24/2018   ADHD (attention deficit hyperactivity disorder) 04/04/2013   Anxiety state, unspecified 04/04/2013    Past Medical History:  Diagnosis Date   Aneurysm ([place]) 2020   Anxiety    managed per pt   Depression    "haven't really dealt with" in "quite some time"   High blood pressure    slightly elevated    No past surgical history on file.  Social History   Tobacco Use   Smoking status: Every Day    Packs/day: 0.50    Types: Cigarettes   Smokeless tobacco: Never   Tobacco comments:    08/08/2019 update, trying to cut back   Vaping Use   Vaping Use: Former  Substance Use Topics   Alcohol use: Yes    Comment: social   Drug use: Yes    Types: Marijuana    Family History  Problem Relation Age of Onset   Stroke Mother        carotid artery dissection during massage    Aneurysm Paternal Grandmother    Hypertension Other        both sides of family   Lung cancer Other        both sides of family    Cerebral aneurysm Maternal Uncle    Migraines Neg Hx        none that he is aware of    No Known  Allergies  Medication list has been reviewed and updated.  Current Outpatient Medications on File Prior to Visit  Medication Sig Dispense Refill   amLODipine (NORVASC) 10 MG tablet TAKE 1 TABLET(10 MG) BY MOUTH DAILY 90 tablet 0   cyclobenzaprine (FLEXERIL) 5 MG tablet Take 1 tablet (5 mg total) by mouth every 8 (eight) hours as needed for muscle spasms. 30 tablet 0   meloxicam (MOBIC) 15 MG tablet Take 1 tablet (15 mg total) by mouth daily. 30 tablet 0   methocarbamol (ROBAXIN) 500 MG tablet Take 1 tablet (500 mg total) by mouth every 8 (eight) hours as needed for muscle spasms. 30 tablet 0   No current facility-administered medications on file prior to visit.    Review of Systems:  As per HPI- otherwise negative.   Physical Examination: There  were no vitals filed for this visit. There were no vitals filed for this visit. There is no height or weight on file to calculate BMI. Ideal Body Weight:    GEN: no acute distress. HEENT: Atraumatic, Normocephalic.  Ears and Nose: No external deformity. CV: RRR, No M/G/R. No JVD. No thrill. No extra heart sounds. PULM: CTA B, no wheezes, crackles, rhonchi. No retractions. No resp. distress. No accessory muscle use. ABD: S, NT, ND, +BS. No rebound. No HSM. EXTR: No c/c/e PSYCH: Normally interactive. Conversant.    Assessment and Plan: ***  Signed Lamar Blinks, MD

## 2022-07-20 ENCOUNTER — Ambulatory Visit: Payer: BC Managed Care – PPO | Admitting: Family Medicine

## 2022-07-21 ENCOUNTER — Ambulatory Visit: Payer: BC Managed Care – PPO | Admitting: Sports Medicine

## 2022-07-28 NOTE — Progress Notes (Deleted)
[place] at [facility] [address] [place], [place] [address] [phone]  Date:  07/29/2022   Name:  Stephen Castro   DOB:  11/10/1990   MRN:  [id number]  PCP:  Darreld Mclean, MD    Chief Complaint: No chief complaint on file.   History of Present Illness:  Stephen Castro is a 31 y.o. 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  The patient myself are present on the call  Most recent labs December 2021 Patient Active Problem List   Diagnosis Date Noted   Chronic migraine without aura without status migrainosus, not intractable 08/08/2019   Hypertension 06/30/2019   Brain aneurysm 06/17/2019   Gastroenteritis 04/13/2019   Shoulder injury, left, initial encounter 01/24/2018   ADHD (attention deficit hyperactivity disorder) 04/04/2013   Anxiety state, unspecified 04/04/2013    Past Medical History:  Diagnosis Date   Aneurysm ([place]) 2020   Anxiety    managed per pt   Depression    "haven't really dealt with" in "quite some time"   High blood pressure    slightly elevated    No past surgical history on file.  Social History   Tobacco Use   Smoking status: Every Day    Packs/day: 0.50    Types: Cigarettes   Smokeless tobacco: Never   Tobacco comments:    08/08/2019 update, trying to cut back   Vaping Use   Vaping Use: Former  Substance Use Topics    Alcohol use: Yes    Comment: social   Drug use: Yes    Types: Marijuana    Family History  Problem Relation Age of Onset   Stroke Mother        carotid artery dissection during massage    Aneurysm Paternal Grandmother    Hypertension Other        both sides of family   Lung cancer Other        both sides of family    Cerebral aneurysm Maternal Uncle    Migraines Neg Hx        none that he is aware of    No Known Allergies  Medication list has been reviewed and updated.  Current Outpatient Medications on File Prior to Visit  Medication Sig Dispense Refill   amLODipine (NORVASC) 10 MG tablet TAKE 1 TABLET(10 MG) BY MOUTH DAILY 90 tablet 0   cyclobenzaprine (FLEXERIL) 5 MG tablet Take 1 tablet (5 mg total) by mouth every 8 (eight) hours as needed for muscle spasms. 30 tablet 0   meloxicam (MOBIC) 15 MG tablet Take 1 tablet (15 mg total) by mouth daily. 30 tablet 0   methocarbamol (ROBAXIN) 500 MG tablet Take 1 tablet (500 mg total) by mouth every 8 (eight) hours as needed for muscle spasms. 30 tablet 0   No current facility-administered medications on file prior to visit.  Review of Systems:  As per HPI- otherwise negative.   Physical Examination: There were no vitals filed for this visit. There were no vitals filed for this visit. There is no height or weight on file to calculate BMI. Ideal Body Weight:    ***  Assessment and Plan: ***  Signed Lamar Blinks, MD

## 2022-07-29 ENCOUNTER — Telehealth (INDEPENDENT_AMBULATORY_CARE_PROVIDER_SITE_OTHER): Payer: BC Managed Care – PPO | Admitting: Family Medicine

## 2022-07-29 ENCOUNTER — Encounter: Payer: Self-pay | Admitting: Family Medicine

## 2022-07-29 DIAGNOSIS — M542 Cervicalgia: Secondary | ICD-10-CM

## 2022-07-29 DIAGNOSIS — Z8679 Personal history of other diseases of the circulatory system: Secondary | ICD-10-CM

## 2022-07-29 NOTE — Progress Notes (Signed)
[place] at [facility] [address] [place], [place] [address] [phone]  Date:  07/29/2022   Name:  Stephen Castro   DOB:  1991/03/24   MRN:  [id number]  PCP:  Darreld Mclean, MD    Chief Complaint: No chief complaint on file.   History of Present Illness:  DELONTAE Castro is a 31 y.o. 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  The patient and myself are present on the call  Most recent labs December 2021  He was seen by Stephen Castro on 07/02/22; his BP was high but he was suffering with acute neck pain at that time.  BP Readings from Last 3 Encounters:  07/07/22 138/78  07/02/22 (!) 178/120  02/18/22 132/85   He does note that his neck is still bothering him He is seeing chiro, accupuncture, sports medicine-he wonders what else should be done  He is not taking any BP meds right now- he has tried to bring his BP down with stress reduction, etc He does not have a BP cuff at home but he can get one  He has been off BP meds for a year or so   His blood pressure was elevated on 8/31, but otherwise recent readings have been acceptable He would really prefer not to go back on medication if possible Patient Active Problem List   Diagnosis Date Noted   Chronic migraine without aura without status migrainosus, not intractable 08/08/2019   Hypertension 06/30/2019    Brain aneurysm 06/17/2019   Gastroenteritis 04/13/2019   Shoulder injury, left, initial encounter 01/24/2018   ADHD (attention deficit hyperactivity disorder) 04/04/2013   Anxiety state, unspecified 04/04/2013    Past Medical History:  Diagnosis Date   Aneurysm ([place]) 2020   Anxiety    managed per pt   Depression    "haven't really dealt with" in "quite some time"   High blood pressure    slightly elevated    No past surgical history on file.  Social History   Tobacco Use   Smoking status: Every Day    Packs/day: 0.50    Types: Cigarettes   Smokeless tobacco: Never   Tobacco comments:    08/08/2019 update, trying to cut back   Vaping Use   Vaping Use: Former  Substance Use Topics   Alcohol use: Yes    Comment: social   Drug use: Yes    Types: Marijuana    Family History  Problem Relation Age of Onset   Stroke Mother        carotid artery dissection during massage    Aneurysm Paternal Grandmother    Hypertension Other        both sides of family   Lung cancer Other        both sides of family    Cerebral aneurysm Maternal Uncle    Migraines Neg Hx  none that he is aware of    No Known Allergies  Medication list has been reviewed and updated.  Current Outpatient Medications on File Prior to Visit  Medication Sig Dispense Refill   amLODipine (NORVASC) 10 MG tablet TAKE 1 TABLET(10 MG) BY MOUTH DAILY 90 tablet 0   cyclobenzaprine (FLEXERIL) 5 MG tablet Take 1 tablet (5 mg total) by mouth every 8 (eight) hours as needed for muscle spasms. 30 tablet 0   meloxicam (MOBIC) 15 MG tablet Take 1 tablet (15 mg total) by mouth daily. 30 tablet 0   methocarbamol (ROBAXIN) 500 MG tablet Take 1 tablet (500 mg total) by mouth every 8 (eight) hours as needed for muscle spasms. 30 tablet 0   No current facility-administered medications on file prior to visit.    Review of Systems:  As per HPI- otherwise negative.   Physical Examination: There were no vitals  filed for this visit. There were no vitals filed for this visit. There is no height or weight on file to calculate BMI. Ideal Body Weight:    Patient observed over video monitor.  Appears his normal self, no distress or shortness of breath is noted   Assessment and Plan: Neck pain - Plan: Ambulatory referral to Physical Therapy  History of hypertension  Patient seen virtually today for concerns of elevated blood pressure and neck pain. As above, he was seen in my partners regarding his neck pain on 8/31, he has since seen sports medicine twice as well as a chiropractor and acupuncturist He wonders what my recommendations are about his neck.  I advised patient I would need to see him and assess his neck prior to making any distinct recommendations.  He is interested in physical therapy, referral is placed.  Main appointment to see him next week-he will let me know sooner if any other concerns  His blood pressure has generally been okay except for 1 visit when his neck was acutely painful.  I advised him he likely can get away without blood pressure medication for now, but that when he is in his 40s and 50s blood pressure medicine may be necessary.  I encouraged him to get a blood pressure cuff so he can monitor his readings at home  Overdue for lab work, we can also do this next week Signed Lamar Blinks, MD

## 2022-07-31 NOTE — Progress Notes (Deleted)
[place] at [facility] [address], [place], [place] [address] [phone]  Date:  08/05/2022   Name:  Stephen Castro   DOB:  [birth year]   MRN:  [id number]  PCP:  Darreld Mclean, MD    Chief Complaint: No chief complaint on file.   History of Present Illness:  Stephen Castro is a 31 y.o. 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  Previously he was seen by sports medicine, Dr. Glennon Mac twice and also my partner Jodi Mourning once Per Dr. Marisue Brooklyn note dated 9/5:   1. Neck pain 2. Cervical paraspinal muscle spasm 3. Somatic dysfunction of cervical region 4. Somatic dysfunction of thoracic region 5. Somatic dysfunction of rib region  -Acute, improving, subsequent visit - Overall improving neck pain, ROM with HEP and pain medicine course - Continue meloxicam for additional 1 to 2 weeks - Continue Flexeril 5 mg every 8 hours as needed for muscle spasms - Continue HEP - Patient has received significant relief with OMT in the past.  Elects for repeat OMT today.  Tolerated well per note below. - Decision today to treat with OMT was based on Physical Exam After verbal consent patient was treated with HVLA (high velocity low amplitude), ME (muscle energy), FPR (flex positional release), ST (soft tissue),   techniques in cervical, rib, thoracic,   areas. Patient tolerated the procedure well with improvement in symptoms.  Patient educated on potential side effects of soreness and recommended to rest, hydrate, and use Tylenol as needed for pain control.   At our previous visit Stephen Castro was not currently taking blood pressure medication,  his recent blood pressures had generally been under okay control Update labs today Patient Active Problem List   Diagnosis Date Noted   Chronic migraine without aura without status migrainosus, not intractable 08/08/2019   Hypertension 06/30/2019   Brain aneurysm 06/17/2019   Gastroenteritis 04/13/2019   Shoulder injury, left, initial encounter 01/24/2018   ADHD (attention deficit hyperactivity disorder) 04/04/2013   Anxiety state, unspecified 04/04/2013    Past Medical History:  Diagnosis Date   Aneurysm ([place]) 2020   Anxiety    managed per pt   Depression    "haven't really dealt with" in "quite some time"   High blood pressure    slightly elevated    No past surgical history on file.  Social History   Tobacco Use   Smoking status: Every Day    Packs/day: 0.50    Types: Cigarettes   Smokeless tobacco: Never   Tobacco comments:    08/08/2019 update, trying to cut back   Vaping Use   Vaping Use: Former  Substance Use Topics   Alcohol use: Yes    Comment: social   Drug use: Yes    Types: Marijuana    Family History  Problem Relation Age of Onset   Stroke Mother        carotid artery dissection during massage    Aneurysm Paternal Grandmother    Hypertension Other        both sides of family   Lung cancer Other  both sides of family    Cerebral aneurysm Maternal Uncle    Migraines Neg Hx        none that he is aware of    No Known Allergies  Medication list has been reviewed and updated.  Current Outpatient Medications on File Prior to Visit  Medication Sig Dispense Refill   cyclobenzaprine (FLEXERIL) 5 MG tablet Take 1 tablet (5 mg total) by mouth every 8 (eight) hours as needed for muscle spasms. 30 tablet 0   meloxicam (MOBIC) 15 MG tablet Take 1 tablet (15 mg total) by mouth daily. 30 tablet 0   methocarbamol (ROBAXIN) 500 MG tablet Take 1 tablet (500 mg total) by mouth every 8 (eight) hours as needed for muscle spasms. 30 tablet 0   No current  facility-administered medications on file prior to visit.    Review of Systems:  As per HPI- otherwise negative.   Physical Examination: There were no vitals filed for this visit. There were no vitals filed for this visit. There is no height or weight on file to calculate BMI. Ideal Body Weight:    GEN: no acute distress. HEENT: Atraumatic, Normocephalic.  Ears and Nose: No external deformity. CV: RRR, No M/G/R. No JVD. No thrill. No extra heart sounds. PULM: CTA B, no wheezes, crackles, rhonchi. No retractions. No resp. distress. No accessory muscle use. ABD: S, NT, ND, +BS. No rebound. No HSM. EXTR: No c/c/e PSYCH: Normally interactive. Conversant.    Assessment and Plan: ***  Signed Lamar Blinks, MD

## 2022-08-05 ENCOUNTER — Ambulatory Visit: Payer: BC Managed Care – PPO | Admitting: Family Medicine

## 2022-08-05 DIAGNOSIS — Z130 Encounter for screening for diseases of the blood and blood-forming organs and certain disorders involving the immune mechanism: Secondary | ICD-10-CM

## 2022-08-05 DIAGNOSIS — Z13220 Encounter for screening for lipoid disorders: Secondary | ICD-10-CM

## 2022-08-05 DIAGNOSIS — Z131 Encounter for screening for diabetes mellitus: Secondary | ICD-10-CM

## 2022-08-05 DIAGNOSIS — I158 Other secondary hypertension: Secondary | ICD-10-CM

## 2022-08-11 ENCOUNTER — Ambulatory Visit: Payer: BC Managed Care – PPO | Attending: Family Medicine | Admitting: Physical Therapy

## 2022-08-11 NOTE — Progress Notes (Deleted)
[place] at [facility] [address] [place], [place] [address] [phone]  Date:  08/12/2022   Name:  Stephen Castro   DOB:  01-19-1991   MRN:  [id number]  PCP:  Darreld Mclean, MD    Chief Complaint: No chief complaint on file.   History of Present Illness:  Stephen Castro is a 31 y.o. 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      BP Readings from Last 3 Encounters:  07/07/22 138/78  07/02/22 (!) 178/120  02/18/22 132/85    He does note that his neck is still bothering him He is seeing chiro, accupuncture, sports medicine-he wonders what else should be done He is not taking any BP meds right now- he has tried to bring his BP down with stress reduction, etc He does not have a BP cuff at home but he can get one  He has been off BP meds for a year or so  His blood pressure was elevated on 8/31, but otherwise recent readings have been acceptable He would really prefer not to go back on medication if possible  Patient Active Problem List   Diagnosis Date Noted   Chronic migraine without aura without status migrainosus, not intractable 08/08/2019   Hypertension 06/30/2019   Brain aneurysm 06/17/2019   Gastroenteritis 04/13/2019   Shoulder injury, left, initial encounter 01/24/2018   ADHD (attention  deficit hyperactivity disorder) 04/04/2013   Anxiety state, unspecified 04/04/2013    Past Medical History:  Diagnosis Date   Aneurysm ([place]) 2020   Anxiety    managed per pt   Depression    "haven't really dealt with" in "quite some time"   High blood pressure    slightly elevated    No past surgical history on file.  Social History   Tobacco Use   Smoking status: Every Day    Packs/day: 0.50    Types: Cigarettes   Smokeless tobacco: Never   Tobacco comments:    08/08/2019 update, trying to cut back   Vaping Use   Vaping Use: Former  Substance Use Topics   Alcohol use: Yes    Comment: social   Drug use: Yes    Types: Marijuana    Family History  Problem Relation Age of Onset   Stroke Mother        carotid artery dissection during massage    Aneurysm Paternal Grandmother    Hypertension Other        both sides of family   Lung cancer Other        both sides of family    Cerebral aneurysm Maternal Uncle    Migraines Neg Hx        none that he is aware of    No  Known Allergies  Medication list has been reviewed and updated.  Current Outpatient Medications on File Prior to Visit  Medication Sig Dispense Refill   cyclobenzaprine (FLEXERIL) 5 MG tablet Take 1 tablet (5 mg total) by mouth every 8 (eight) hours as needed for muscle spasms. 30 tablet 0   meloxicam (MOBIC) 15 MG tablet Take 1 tablet (15 mg total) by mouth daily. 30 tablet 0   methocarbamol (ROBAXIN) 500 MG tablet Take 1 tablet (500 mg total) by mouth every 8 (eight) hours as needed for muscle spasms. 30 tablet 0   No current facility-administered medications on file prior to visit.    Review of Systems:  As per HPI- otherwise negative.   Physical Examination: There were no vitals filed for this visit. There were no vitals filed for this visit. There is no height or weight on file to calculate BMI. Ideal Body Weight:    GEN: no acute distress. HEENT: Atraumatic, Normocephalic.  Ears and  Nose: No external deformity. CV: RRR, No M/G/R. No JVD. No thrill. No extra heart sounds. PULM: CTA B, no wheezes, crackles, rhonchi. No retractions. No resp. distress. No accessory muscle use. ABD: S, NT, ND, +BS. No rebound. No HSM. EXTR: No c/c/e PSYCH: Normally interactive. Conversant.    Assessment and Plan: ***  Signed Lamar Blinks, MD

## 2022-08-12 ENCOUNTER — Ambulatory Visit: Payer: BC Managed Care – PPO | Admitting: Family Medicine

## 2022-08-12 ENCOUNTER — Encounter: Payer: Self-pay | Admitting: Family Medicine

## 2022-08-27 ENCOUNTER — Telehealth (INDEPENDENT_AMBULATORY_CARE_PROVIDER_SITE_OTHER): Payer: BC Managed Care – PPO | Admitting: Family Medicine

## 2022-08-27 ENCOUNTER — Encounter: Payer: Self-pay | Admitting: Family Medicine

## 2022-08-27 DIAGNOSIS — S46812A Strain of other muscles, fascia and tendons at shoulder and upper arm level, left arm, initial encounter: Secondary | ICD-10-CM

## 2022-08-27 MED ORDER — HYDROCODONE-ACETAMINOPHEN 5-325 MG PO TABS: 1.0000 | ORAL_TABLET | Freq: Four times a day (QID) | ORAL | 0 refills | Status: DC | PRN

## 2022-08-27 MED ORDER — MELOXICAM 15 MG PO TABS: 15.0000 mg | ORAL_TABLET | Freq: Every day | ORAL | 0 refills | Status: DC

## 2022-08-27 MED ORDER — TIZANIDINE HCL 4 MG PO TABS: 4.0000 mg | ORAL_TABLET | Freq: Four times a day (QID) | ORAL | 0 refills | Status: DC | PRN

## 2022-08-27 NOTE — Progress Notes (Signed)
Musculoskeletal Exam  Patient: Stephen Castro DOB: [birth year]  DOS: 08/27/2022  SUBJECTIVE:  Chief Complaint:   Chief Complaint  Patient presents with   Neck Pain    Patient complains of "severe neck pain running down to shoulder and jaw line". Started 3 weeks ago.     Stephen Castro is a 31 y.o.  male for evaluation and treatment of neck pain.  Due to COVID-19 pandemic, we are interacting via web portal for an electronic face-to-face visit. I verified patient's ID using 2 identifiers. Patient agreed to proceed with visit via this method. Patient is at home, I am at office. Patient and I are present for visit.   Onset:  3 weeks ago. No inj or change in activity.  Location: L trap Character:   spasming   Progression of issue:  has worsened Associated symptoms: decreased ROM of neck and movement 2/2 pain No redness, bruising, swelling.  Treatment: to date has been rest and OTC NSAIDS.   Neurovascular symptoms: no  Past Medical History:  Diagnosis Date   Aneurysm ([place]) 2020   Anxiety    managed per pt   Depression    "haven't really dealt with" in "quite some time"   High blood pressure    slightly elevated    Objective: No conversational dyspnea Age appropriate judgment and insight Nml affect and mood  Assessment:  Strain of left trapezius muscle, initial encounter - Plan: Ambulatory referral to Physical Therapy, tiZANidine (ZANAFLEX) 4 MG tablet, HYDROcodone-acetaminophen (NORCO) 5-325 MG tablet, meloxicam (MOBIC) 15 MG tablet  Plan: Stretches/exercises, heat, ice, Tylenol, PT, Mobic, Norco prn. Warnings about Norco verbalized. Letter for work given.   F/u prn. The patient voiced understanding and agreement to the plan.   [place], DO 08/27/22  2:50 PM

## 2022-09-08 ENCOUNTER — Ambulatory Visit: Payer: BC Managed Care – PPO | Admitting: Physical Therapy

## 2022-10-14 ENCOUNTER — Other Ambulatory Visit: Payer: Self-pay | Admitting: Family Medicine

## 2022-10-14 DIAGNOSIS — S46812A Strain of other muscles, fascia and tendons at shoulder and upper arm level, left arm, initial encounter: Secondary | ICD-10-CM

## 2022-10-14 NOTE — Telephone Encounter (Signed)
Ok to refill, is not your patient

## 2022-10-14 NOTE — Telephone Encounter (Signed)
I will refill for now but if he needs more, will likely need to be re-evaluated by PCP.

## 2022-10-16 ENCOUNTER — Other Ambulatory Visit: Payer: Self-pay | Admitting: Family Medicine

## 2022-10-16 DIAGNOSIS — S46812A Strain of other muscles, fascia and tendons at shoulder and upper arm level, left arm, initial encounter: Secondary | ICD-10-CM

## 2022-10-19 ENCOUNTER — Telehealth: Payer: Self-pay | Admitting: Family Medicine

## 2022-10-19 DIAGNOSIS — S46812A Strain of other muscles, fascia and tendons at shoulder and upper arm level, left arm, initial encounter: Secondary | ICD-10-CM

## 2022-10-19 MED ORDER — TIZANIDINE HCL 4 MG PO TABS: ORAL_TABLET | ORAL | 0 refills | Status: DC

## 2022-10-19 NOTE — Telephone Encounter (Signed)
As of right now:   Stephen Castro- 10 am Lovena Le: [address] [place], [place]

## 2022-10-19 NOTE — Telephone Encounter (Signed)
From [facility] MyChart message on 10/18/22:  Stephen Castro is also having some problems but can't get logged into his mychart.   "Chest pains like indigestion, exhausted and sleeping constantly, high blood pressure around 142 over 107. Around my solar plexes are kind of swollen. Not sure how better to explain. The last few times I have been to the office I have not been able to get you and I do not feel I get the same level of care from the other doctors."

## 2022-10-19 NOTE — Telephone Encounter (Signed)
Called pt back regarding a message from his wife  Pt notes he "slept 20 hours a night for the last 2 days." He is feeling really tired He feels like his belly is "slightly distended right where the xiphoid process is" This area was also hurting a lot yesterday, tums not helping him He tried famotidine This is not bothering him today He notes his BP was 147/107 earlier in this illness, he did not check it today He is no longer taking his BP meds  He notes that the day prior to onset of symptoms he did eat pizza and had lemonade, wonders if this might have bothered his stomach. Apologized that I do not have any spots open this week right now. Offered to get him an appt today with another provider today but he declines, he would like to be seen tomorrow if possible He has not noted any fever  Will work on this Refilled his tizanidine He will go to the ER if getting worse Recommended that he take a covid test and he will do so

## 2022-10-19 NOTE — Telephone Encounter (Signed)
Called pt back and offered appt for tomorrow- scheduled to see Lovena Le at 11:20. Also offered to facilitate ER or UC visit today- pt declines, he notes he is feeling better but is aware he should seek care if getting worse

## 2022-10-20 ENCOUNTER — Encounter: Payer: Self-pay | Admitting: Family Medicine

## 2022-10-20 ENCOUNTER — Ambulatory Visit (INDEPENDENT_AMBULATORY_CARE_PROVIDER_SITE_OTHER): Payer: BC Managed Care – PPO | Admitting: Family Medicine

## 2022-10-20 VITALS — BP 148/92 | HR 94 | Temp 98.0°F | Resp 18 | Ht 70.0 in | Wt 196.4 lb

## 2022-10-20 DIAGNOSIS — R0789 Other chest pain: Secondary | ICD-10-CM | POA: Diagnosis not present

## 2022-10-20 DIAGNOSIS — I158 Other secondary hypertension: Secondary | ICD-10-CM | POA: Diagnosis not present

## 2022-10-20 DIAGNOSIS — R5383 Other fatigue: Secondary | ICD-10-CM | POA: Diagnosis not present

## 2022-10-20 NOTE — Progress Notes (Signed)
Acute Office Visit  Subjective:     Patient ID: Stephen Castro, male    DOB: Jun 14, 1991, 31 y.o.   MRN: [id number]  Chief Complaint  Patient presents with   Hypertension    Pt says he started feeling lethargic, feeling swollen in the chest and his BP has been elevated for the past 2-3 days.  He was having a pain in the chest- this subsided.     Patient is in today for reflux, elevated blood pressure.  Patient's wife left a message for PCP yesterday reporting that he was fatigued (sleeping 20 hours a night for the past 2 days), mild distention and discomfort to epigastric region that did not improve with Tums, elevated BP at home. Reported that the day prior to symptom onset he had eaten pizza and lemonade. He was offered a same day appointment, but declined and scheduled for today instead.   Reports that it felt like a lot of pressure that was shooting up esophagus like reflux, but OTC remedies were not helpful. Reports he has been sleeping a lot, but only a few hours at a time, not quality sleep. He has been waking up with chills/sweats for the past couple days. BP has also been high at home - admits to poor diet choices, lots of sodium. He has not had any pain in the past 2 days (only lasted one day total).   Reports the slight, palpable distention near the middle of his chest (xyphoid process) started about a month ago but he never thought anything of it. Patient denies any cough, fevers, chest pain, palpitations, dyspnea, wheezing, edema, recurrent headaches, vision changes.     ROS All review of systems negative except what is listed in the HPI      Objective:    BP (!) 148/92   Pulse 94   Temp 98 F (36.7 C) (Oral)   Resp 18   Ht '5\' 10"'$  (1.778 m)   Wt 196 lb 6.4 oz (89.1 kg)   SpO2 95%   BMI 28.18 kg/m    Physical Exam Vitals reviewed.  Constitutional:      Appearance: Normal appearance.  Cardiovascular:     Rate and Rhythm: Normal rate and regular rhythm.      Pulses: Normal pulses.     Heart sounds: Normal heart sounds.  Pulmonary:     Effort: Pulmonary effort is normal.     Breath sounds: Normal breath sounds.  Skin:    General: Skin is warm and dry.  Neurological:     Mental Status: He is alert and oriented to person, place, and time.  Psychiatric:        Mood and Affect: Mood normal.        Behavior: Behavior normal.        Thought Content: Thought content normal.        Judgment: Judgment normal.     No results found for any visits on 10/20/22.      Assessment & Plan:   Problem List Items Addressed This Visit       Cardiovascular and Mediastinum   Hypertension Blood pressure is not at goal for age and co-morbidities.   Recommendations: monitoring at home, follow-up in 2 weeks to reassess  - BP goal <130/80 - monitor and log blood pressures at home - check around the same time each day in a relaxed setting - Limit salt to <2000 mg/day - Follow DASH eating plan (heart healthy diet) - limit alcohol  to 2 standard drinks per day for men and 1 per day for women - avoid tobacco products - get at least 2 hours of regular aerobic exercise weekly Patient aware of signs/symptoms requiring further/urgent evaluation.   Relevant Orders   EKG 12-Lead (Completed)   Other Visit Diagnoses     Fatigue, unspecified type    -  Primary Declined additional workup today including labs.    Relevant Orders   EKG 12-Lead (Completed)   Chest wall discomfort     No red flags on exam today. Previously concerning symptoms are not present at this time. EKG is SR 71, RSR, consider LAE; consistent with previous EKGs. No signs of ischemia. Declined additional workup today including labs Consider hiatal hernia, muscle strain, reflux Possible reflux: - If symptoms return, recommend trying daily omeprazole (OTC) and as needed Pepcid - avoid trigger foods, heavy meals, lying down after meals - see attached eduction sheet Reassess symptoms and BP  with PCP in 2 weeks.  Please contact office for follow-up if symptoms do not improve or worsen. Seek emergency care if symptoms become severe.    Relevant Orders   EKG 12-Lead (Completed)       No orders of the defined types were placed in this encounter.   Return in about 2 weeks (around 11/03/2022) for follow-up PCP.  Terrilyn Saver, NP

## 2022-10-20 NOTE — Patient Instructions (Addendum)
EKG is consistent with previous EKGs. No signs of ischemia  Possible reflux: - If symptoms return, recommend trying daily omeprazole (OTC) and as needed Pepcid - avoid trigger foods, heavy meals, lying down after meals - see attached eduction sheet  Blood pressure is not at goal for age and co-morbidities.   Recommendations: monitoring at home, follow-up in 2 weeks to reassess  - BP goal <130/80 - monitor and log blood pressures at home - check around the same time each day in a relaxed setting - Limit salt to <2000 mg/day - Follow DASH eating plan (heart healthy diet) - limit alcohol to 2 standard drinks per day for men and 1 per day for women - avoid tobacco products - get at least 2 hours of regular aerobic exercise weekly Patient aware of signs/symptoms requiring further/urgent evaluation.

## 2022-10-21 ENCOUNTER — Encounter: Payer: Self-pay | Admitting: Neurology

## 2022-10-23 ENCOUNTER — Other Ambulatory Visit: Payer: Self-pay | Admitting: Family Medicine

## 2022-10-23 ENCOUNTER — Ambulatory Visit
Admission: RE | Admit: 2022-10-23 | Discharge: 2022-10-23 | Disposition: A | Discharge status: Home / Self Care | Payer: BC Managed Care – PPO | Source: Ambulatory Visit | Attending: Neurology | Admitting: Neurology

## 2022-10-23 DIAGNOSIS — I671 Cerebral aneurysm, nonruptured: Secondary | ICD-10-CM | POA: Diagnosis not present

## 2022-10-23 DIAGNOSIS — S46812A Strain of other muscles, fascia and tendons at shoulder and upper arm level, left arm, initial encounter: Secondary | ICD-10-CM

## 2022-10-24 NOTE — Progress Notes (Signed)
Kindly inform the patient that MR angiogram study of the blood vessels in the brain shows no major blockages or aneurysms to worry about.

## 2022-11-05 DIAGNOSIS — J069 Acute upper respiratory infection, unspecified: Secondary | ICD-10-CM | POA: Diagnosis not present

## 2022-11-06 NOTE — Progress Notes (Deleted)
[place] at [facility] [address], [place], [place] [address] [phone]  Date:  11/09/2022   Name:  Stephen Castro   DOB:  May 02, 1991   MRN:  [id number]  PCP:  Stephen Mclean, MD    Chief Complaint: No chief complaint on file.   History of Present Illness:  Stephen Castro is a 32 y.o. very pleasant male patient who presents with the following:  Pt seen today for periodic recheck Last seen by myself virtually in September: Patient seen virtually today for concerns of elevated blood pressure and neck pain. As above, he was seen in my partners regarding his neck pain on 8/31, he has since seen sports medicine twice as well as a chiropractor and acupuncturist He wonders what my recommendations are about his neck.  I advised patient I would need to see him and assess his neck prior to making any distinct recommendations.  He is interested in physical therapy, referral is placed.  Maid appointment to see him next week-he will let me know sooner if any other concerns His blood pressure has generally been okay except for 1 visit when his neck was acutely painful.  I advised him he likely can get away without blood pressure medication for now, but that when he is in his 40s and 50s blood pressure medicine may be necessary.  I encouraged him to get a blood pressure cuff so he can monitor his readings at home   He saw Caleen Jobs on 12/19 with concern of fatigue, chest wall pain and mild HTN His BP was noted to be a bit elevated- 148/92  Flu shot Recommend covid booster and tetanus  Patient Active Problem List   Diagnosis Date Noted   Chronic migraine without aura without status migrainosus, not intractable 08/08/2019   Hypertension 06/30/2019   Brain aneurysm 06/17/2019   Gastroenteritis 04/13/2019   Shoulder injury, left, initial encounter 01/24/2018   ADHD (attention deficit hyperactivity disorder) 04/04/2013   Anxiety state,  unspecified 04/04/2013    Past Medical History:  Diagnosis Date   Aneurysm ([place]) 2020   Anxiety    managed per pt   Depression    "haven't really dealt with" in "quite some time"   High blood pressure    slightly elevated    No past surgical history on file.  Social History   Tobacco Use   Smoking status: Every Day    Packs/day: 0.50    Types: Cigarettes   Smokeless tobacco: Never   Tobacco comments:    08/08/2019 update, trying to cut back   Vaping Use   Vaping Use: Former  Substance Use Topics   Alcohol use: Yes    Comment: social   Drug use: Yes    Types: Marijuana    Family History  Problem Relation Age of Onset   Stroke Mother        carotid artery dissection during massage    Aneurysm Paternal Grandmother    Hypertension Other        both sides of family   Lung cancer Other        both sides of family    Cerebral aneurysm Maternal Uncle    Migraines Neg Hx        none that he is aware of    No Known Allergies  Medication list has been reviewed and updated.  Current Outpatient Medications on File Prior to Visit  Medication  Sig Dispense Refill   meloxicam (MOBIC) 15 MG tablet Take 1 tablet (15 mg total) by mouth daily. 30 tablet 0   tiZANidine (ZANAFLEX) 4 MG tablet Take 1 tablet (4 mg total) by mouth every 6 (six) hours as needed for muscle spasms. 30 tablet 0   No current facility-administered medications on file prior to visit.    Review of Systems:  As per HPI- otherwise negative.   Physical Examination: There were no vitals filed for this visit. There were no vitals filed for this visit. There is no height or weight on file to calculate BMI. Ideal Body Weight:    GEN: no acute distress. HEENT: Atraumatic, Normocephalic.  Ears and Nose: No external deformity. CV: RRR, No M/G/R. No JVD. No thrill. No extra heart sounds. PULM: CTA B, no wheezes, crackles, rhonchi. No retractions. No resp. distress. No accessory muscle use. ABD: S, NT,  ND, +BS. No rebound. No HSM. EXTR: No c/c/e PSYCH: Normally interactive. Conversant.    Assessment and Plan: ***  Signed Lamar Blinks, MD

## 2022-11-09 ENCOUNTER — Ambulatory Visit: Payer: BC Managed Care – PPO | Admitting: Family Medicine

## 2022-11-11 NOTE — Patient Instructions (Incomplete)
It was good to see you again today-I am sorry things have and so difficult recently  Lets have you start back on losartan, I gave you 50 mg.  Okay to try 1/2 tablet / 25 mg at first.  If your blood pressure does not come under about 135/85 increase to 50 mg  Use the cough syrup as needed, however do not take when you are driving  I wonder if AFOs might benefit Stephen Castro-these would prevent her toes from dragging and might make it easier for her to walk Also, as we discussed certainly plenty of people drive who do not have good lower extremity function.  You can definitely get modifications made to a vehicle if it comes to that!

## 2022-11-11 NOTE — Progress Notes (Unsigned)
[place] at [facility] [address] [place], [place] [address] [phone]  Date:  11/12/2022   Name:  Stephen Castro   DOB:  08/10/1991   MRN:  [id number]  PCP:  Darreld Mclean, MD    Chief Complaint: No chief complaint on file.   History of Present Illness:  Stephen Castro is a 32 y.o. very pleasant male patient who presents with the following:  Patient seen today for short-term follow-up He was seen in the office by Caleen Jobs, nurse practitioner on 12/19 for concern of elevated blood pressure, fatigue, atypical chest discomfort Patient Active Problem List   Diagnosis Date Noted   Chronic migraine without aura without status migrainosus, not intractable 08/08/2019   Hypertension 06/30/2019   Brain aneurysm 06/17/2019   Gastroenteritis 04/13/2019   Shoulder injury, left, initial encounter 01/24/2018   ADHD (attention deficit hyperactivity disorder) 04/04/2013   Anxiety state, unspecified 04/04/2013    Past Medical History:  Diagnosis Date   Aneurysm ([place]) 2020   Anxiety    managed per pt   Depression    "haven't really dealt with" in "quite some time"   High blood pressure    slightly elevated    No past surgical history on file.  Social History   Tobacco Use   Smoking status: Every Day    Packs/day: 0.50    Types: Cigarettes   Smokeless tobacco: Never   Tobacco comments:    08/08/2019 update, trying to cut back   Vaping Use   Vaping Use: Former  Substance Use Topics   Alcohol use: Yes    Comment: social   Drug use: Yes    Types: Marijuana    Family History  Problem Relation Age of Onset   Stroke Mother        carotid artery dissection during massage    Aneurysm Paternal Grandmother    Hypertension Other        both sides of family   Lung cancer Other        both sides of family    Cerebral aneurysm Maternal Uncle    Migraines Neg Hx        none that he is aware of    No Known  Allergies  Medication list has been reviewed and updated.  Current Outpatient Medications on File Prior to Visit  Medication Sig Dispense Refill   meloxicam (MOBIC) 15 MG tablet Take 1 tablet (15 mg total) by mouth daily. 30 tablet 0   tiZANidine (ZANAFLEX) 4 MG tablet Take 1 tablet (4 mg total) by mouth every 6 (six) hours as needed for muscle spasms. 30 tablet 0   No current facility-administered medications on file prior to visit.    Review of Systems:  As per HPI- otherwise negative.   Physical Examination: There were no vitals filed for this visit. There were no vitals filed for this visit. There is no height or weight on file to calculate BMI. Ideal Body Weight:    GEN: no acute distress. HEENT: Atraumatic, Normocephalic.  Ears and Nose: No external deformity. CV: RRR, No M/G/R. No JVD. No thrill. No extra heart sounds. PULM: CTA B, no wheezes, crackles, rhonchi. No retractions. No resp. distress. No accessory muscle use. ABD: S, NT, ND, +BS. No rebound. No HSM. EXTR: No c/c/e PSYCH: Normally interactive. Conversant.    Assessment and Plan: ***  Signed Lamar Blinks, MD

## 2022-11-12 ENCOUNTER — Ambulatory Visit (INDEPENDENT_AMBULATORY_CARE_PROVIDER_SITE_OTHER): Payer: BC Managed Care – PPO | Admitting: Family Medicine

## 2022-11-12 VITALS — BP 154/94 | HR 81 | Temp 97.6°F | Resp 18 | Ht 70.0 in | Wt 198.8 lb

## 2022-11-12 DIAGNOSIS — I10 Essential (primary) hypertension: Secondary | ICD-10-CM | POA: Diagnosis not present

## 2022-11-12 DIAGNOSIS — R051 Acute cough: Secondary | ICD-10-CM

## 2022-11-12 MED ORDER — HYDROCODONE BIT-HOMATROP MBR 5-1.5 MG/5ML PO SOLN: 5.0000 mL | Freq: Three times a day (TID) | ORAL | 0 refills | Status: DC | PRN

## 2022-11-12 MED ORDER — LOSARTAN POTASSIUM 50 MG PO TABS: ORAL_TABLET | ORAL | 3 refills | Status: DC

## 2022-11-17 ENCOUNTER — Telehealth: Payer: Self-pay | Admitting: Family Medicine

## 2022-11-17 DIAGNOSIS — R051 Acute cough: Secondary | ICD-10-CM

## 2022-11-17 MED ORDER — HYDROCODONE BIT-HOMATROP MBR 5-1.5 MG/5ML PO SOLN: 5.0000 mL | Freq: Three times a day (TID) | ORAL | 0 refills | Status: AC | PRN

## 2022-11-17 MED ORDER — AZITHROMYCIN 250 MG PO TABS: ORAL_TABLET | ORAL | 0 refills | Status: AC

## 2022-11-17 MED ORDER — ALBUTEROL SULFATE HFA 108 (90 BASE) MCG/ACT IN AERS: 2.0000 | INHALATION_SPRAY | Freq: Four times a day (QID) | RESPIRATORY_TRACT | 0 refills | Status: DC | PRN

## 2022-11-17 NOTE — Telephone Encounter (Signed)
Pt was seen on 11/12/22- please advise.

## 2022-11-17 NOTE — Telephone Encounter (Signed)
Pt called stating that he is still having issue off his previous appt. Pt stated that the body fatigue has gone away but he is crashing out of nowhere. He also stated that he is only getting about 2 hours of sleep at a time. Pt also stated he has been using OTC cough syrup with not much effect and his albuterol inhaler has run out. Please Advise.

## 2022-11-17 NOTE — Telephone Encounter (Signed)
Received message, gave patient a call He is not running fevers, but cough seems worse and he is still not feeling well.  I added azithromycin, refilled albuterol, gave him a refill of hydrocodone syrup.  Offered to do a chest x-ray, he declines right now but I can order this for him later if need be.  He will let me know if not feeling better soon

## 2022-12-09 ENCOUNTER — Other Ambulatory Visit: Payer: Self-pay | Admitting: Family Medicine

## 2022-12-09 DIAGNOSIS — R051 Acute cough: Secondary | ICD-10-CM

## 2022-12-29 ENCOUNTER — Other Ambulatory Visit: Payer: Self-pay

## 2022-12-29 ENCOUNTER — Encounter (HOSPITAL_BASED_OUTPATIENT_CLINIC_OR_DEPARTMENT_OTHER): Payer: Self-pay | Admitting: Emergency Medicine

## 2022-12-29 ENCOUNTER — Encounter: Payer: Self-pay | Admitting: Family Medicine

## 2022-12-29 ENCOUNTER — Telehealth: Payer: Self-pay | Admitting: Family Medicine

## 2022-12-29 ENCOUNTER — Emergency Department (HOSPITAL_BASED_OUTPATIENT_CLINIC_OR_DEPARTMENT_OTHER)
Admission: EM | Admit: 2022-12-29 | Discharge: 2022-12-29 | Disposition: A | Discharge status: Home / Self Care | Payer: BC Managed Care – PPO | Attending: Emergency Medicine | Admitting: Emergency Medicine

## 2022-12-29 ENCOUNTER — Emergency Department (HOSPITAL_BASED_OUTPATIENT_CLINIC_OR_DEPARTMENT_OTHER): Payer: BC Managed Care – PPO

## 2022-12-29 DIAGNOSIS — F17210 Nicotine dependence, cigarettes, uncomplicated: Secondary | ICD-10-CM | POA: Diagnosis not present

## 2022-12-29 DIAGNOSIS — R079 Chest pain, unspecified: Secondary | ICD-10-CM | POA: Diagnosis not present

## 2022-12-29 DIAGNOSIS — I10 Essential (primary) hypertension: Secondary | ICD-10-CM | POA: Insufficient documentation

## 2022-12-29 DIAGNOSIS — Z79899 Other long term (current) drug therapy: Secondary | ICD-10-CM | POA: Diagnosis not present

## 2022-12-29 LAB — CBC WITH DIFFERENTIAL/PLATELET
Abs Immature Granulocytes: 0.02 10*3/uL (ref 0.00–0.07)
Basophils Absolute: 0.1 10*3/uL (ref 0.0–0.1)
Basophils Relative: 1 %
Eosinophils Absolute: 0.5 10*3/uL (ref 0.0–0.5)
Eosinophils Relative: 4 %
HCT: 47 % (ref 39.0–52.0)
Hemoglobin: 16.1 g/dL (ref 13.0–17.0)
Immature Granulocytes: 0 %
Lymphocytes Relative: 33 %
Lymphs Abs: 3.4 10*3/uL (ref 0.7–4.0)
MCH: 32.6 pg (ref 26.0–34.0)
MCHC: 34.3 g/dL (ref 30.0–36.0)
MCV: 95.1 fL (ref 80.0–100.0)
Monocytes Absolute: 0.8 10*3/uL (ref 0.1–1.0)
Monocytes Relative: 8 %
Neutro Abs: 5.5 10*3/uL (ref 1.7–7.7)
Neutrophils Relative %: 54 %
Platelets: 290 10*3/uL (ref 150–400)
RBC: 4.94 MIL/uL (ref 4.22–5.81)
RDW: 11.6 % (ref 11.5–15.5)
WBC: 10.1 10*3/uL (ref 4.0–10.5)
nRBC: 0 % (ref 0.0–0.2)

## 2022-12-29 LAB — COMPREHENSIVE METABOLIC PANEL
ALT: 133 U/L — ABNORMAL HIGH (ref 0–44)
AST: 78 U/L — ABNORMAL HIGH (ref 15–41)
Albumin: 4.2 g/dL (ref 3.5–5.0)
Alkaline Phosphatase: 88 U/L (ref 38–126)
Anion gap: 7 (ref 5–15)
BUN: 13 mg/dL (ref 6–20)
CO2: 25 mmol/L (ref 22–32)
Calcium: 8.7 mg/dL — ABNORMAL LOW (ref 8.9–10.3)
Chloride: 101 mmol/L (ref 98–111)
Creatinine, Ser: 1.05 mg/dL (ref 0.61–1.24)
GFR, Estimated: >60 mL/min (ref >60)
Glucose, Bld: 112 mg/dL — ABNORMAL HIGH (ref 70–99)
Potassium: 3.6 mmol/L (ref 3.5–5.1)
Sodium: 133 mmol/L — ABNORMAL LOW (ref 135–145)
Total Bilirubin: 0.7 mg/dL (ref 0.3–1.2)
Total Protein: 7.4 g/dL (ref 6.5–8.1)

## 2022-12-29 LAB — TROPONIN I (HIGH SENSITIVITY): Troponin I (High Sensitivity): 5 ng/L (ref <18)

## 2022-12-29 MED ORDER — LOSARTAN POTASSIUM 50 MG PO TABS: 75.0000 mg | ORAL_TABLET | Freq: Every day | ORAL | 0 refills | Status: DC

## 2022-12-29 NOTE — Telephone Encounter (Signed)
Pt states his bp was 170/110 yesterday and heart rate 96 with tingling and pain in his arm along with a headache, sweats and ringing in his ears. He did not go to the ED yesterday and wanted to wait till pcp's first opening 3/6. Transferred to triage just to be safe for nurse eval as he had not checked bp today.

## 2022-12-29 NOTE — Telephone Encounter (Signed)
Pt was seen at ED today.

## 2022-12-29 NOTE — ED Triage Notes (Signed)
States had HTN yesterday,  it was 170/ 110  and he does take BP meds called his dr and was referred to ER , jsut keeps flushed and sweats and states forgets to breath , pinches in chest

## 2022-12-29 NOTE — ED Notes (Addendum)
Pt states had some HBP yesterday , had some chest pressure and broke out in sweat then and today also states had some tingling in his left arm, spoke to his dr and was told to come to the ER , does admit to occ missing his meds

## 2022-12-29 NOTE — Discharge Instructions (Addendum)
We evaluated you for your high blood pressure.  Your laboratory testing, including your cardiac enzymes, was reassuring.  Your chest x-ray was also reassuring.  Please follow-up with your primary doctor to recheck your blood pressure.  We have increased your blood pressure medication to 75 mg daily.  Please stop smoking as this is likely contributing to your high blood pressure.  If you develop any new symptoms or worsening symptoms such as severe headaches, vision changes, vomiting, difficulty breathing, severe chest pain, or any other concerning symptoms, please return to the emergency department.

## 2022-12-29 NOTE — Telephone Encounter (Signed)
Initial Comment Caller states his blood pressure spike to 170/110 yesterday. Translation No Nurse Assessment Nurse: Quentin Ore, RN, April Date/Time Eilene Ghazi Time): 12/29/2022 11:16:42 AM Confirm and document reason for call. If symptomatic, describe symptoms. ---Caller states he had a blood pressure spike of 170/110 HR 92 Yesterday and today it's 142/105 HR 84. Caller states he has a hard time remembering his meds and misses it every now and again, he's only been on it for about a month and is trying to quit smoking. Does the patient have any new or worsening symptoms? ---Yes Will a triage be completed? ---Yes Related visit to physician within the last 2 weeks? ---No Does the PT have any chronic conditions? (i.e. diabetes, asthma, this includes High risk factors for pregnancy, etc.) ---Yes List chronic conditions. ---HTN Is this a behavioral health or substance abuse call? ---No Guidelines Guideline Title Affirmed Question Affirmed Notes Nurse Date/Time (Eastern Time) Blood Pressure - High [id number] Systolic BP >= 0000000 OR Diastolic >= [id number] AND [id number] cardiac (e.g., breathing difficulty, chest pain) or neurologic symptoms (e.g., new-onset blurred Quentin Ore, RN, April 12/29/2022 11:20:40 AM PLEASE NOTE: All timestamps contained within this report are represented as [place] Standard Time. CONFIDENTIALTY NOTICE: This fax transmission is intended only for the addressee. It contains information that is legally privileged, confidential or otherwise protected from use or disclosure. If you are not the intended recipient, you are strictly prohibited from reviewing, disclosing, copying using or disseminating any of this information or taking any action in reliance on or regarding this information. If you have received this fax in error, please notify us immediately by telephone so that we can arrange for its return to [place]. Phone: [phone], Toll-Free: [phone], Fax: [phone] Page: 2 of  2 Call Id: [id number] Guidelines Guideline Title Affirmed Question Affirmed Notes Nurse Date/Time Eilene Ghazi Time) or double vision, unsteady gait) Disp. Time Eilene Ghazi Time) Disposition Final User 12/29/2022 11:23:59 AM Go to ED Now Yes Quentin Ore, RN, April Final Disposition 12/29/2022 11:23:59 AM Go to ED Now Yes Quentin Ore, RN, April Caller Disagree/Comply Comply Caller Understands Yes PreDisposition InappropriateToAsk Care Advice Given Per Guideline GO TO ED NOW: * You need to be seen in the Emergency Department. * Go to the ED at ___________ [place] now. Drive carefully. NOTE TO TRIAGER - DRIVING: * Another adult should drive. * Patient should not delay going to the emergency department. CALL EMS 911 IF: * Passes out or faints * Becomes confused * Becomes too weak to stand * You become worse CARE ADVICE given per High Blood Pressure (Adult) guideline. Referrals [facility] - ED

## 2022-12-29 NOTE — ED Provider Notes (Signed)
[facility] Provider Note  CSN: [id number] Arrival date & time: 12/29/22 1139  Chief Complaint(s) Hypertension  HPI Stephen Castro is a 32 y.o. male with history of anxiety continue to the emergency department with elevated blood pressure.  Patient reports he checked his blood pressure yesterday and it was 170/110.  It remained elevated today to 150s so he called the triage helpline and they advised him to come to the ER.  He reports some "pinching" chest pain located in the left chest, mild shortness of breath.  He reports that on arrival to the emergency department he had a mild headache which lasted 5 minutes and is now resolved.  No sore throat, runny nose, fevers.  Reports increased anxiety recently as well as sweating.  He reports that he has been compliant with his blood pressure for the past week.  He reports he checked it 1 week ago and is [id number] systolic.   Past Medical History Past Medical History:  Diagnosis Date   Aneurysm ([place]) 2020   Anxiety    managed per pt   Depression    "haven't really dealt with" in "quite some time"   High blood pressure    slightly elevated   Patient Active Problem List   Diagnosis Date Noted   Chronic migraine without aura without status migrainosus, not intractable 08/08/2019   Hypertension 06/30/2019   Brain aneurysm 06/17/2019   Gastroenteritis 04/13/2019   Shoulder injury, left, initial encounter 01/24/2018   ADHD (attention deficit hyperactivity disorder) 04/04/2013   Anxiety state, unspecified 04/04/2013   Home Medication(s) Prior to Admission medications   Medication Sig Start Date End Date Taking? Authorizing Provider  albuterol (VENTOLIN HFA) 108 (90 Base) MCG/ACT inhaler INHALE 2 PUFFS INTO THE LUNGS EVERY 6 HOURS AS NEEDED FOR WHEEZING OR SHORTNESS OF BREATH 12/09/22   Copland, Gay Filler, MD  losartan (COZAAR) 50 MG tablet Take 1.5 tablets (75 mg total) by mouth daily. 12/29/22    Cristie Hem, MD  meloxicam (MOBIC) 15 MG tablet Take 1 tablet (15 mg total) by mouth daily. 08/27/22   Wendling, Crosby Oyster, DO  tiZANidine (ZANAFLEX) 4 MG tablet Take 1 tablet (4 mg total) by mouth every 6 (six) hours as needed for muscle spasms. 10/23/22   Copland, Gay Filler, MD                                                                                                                                    Past Surgical History History reviewed. No pertinent surgical history. Family History Family History  Problem Relation Age of Onset   Stroke Mother        carotid artery dissection during massage    Aneurysm Paternal Grandmother    Hypertension Other        both sides of family   Lung cancer Other        both  sides of family    Cerebral aneurysm Maternal Uncle    Migraines Neg Hx        none that he is aware of    Social History Social History   Tobacco Use   Smoking status: Every Day    Packs/day: 0.50    Types: Cigarettes   Smokeless tobacco: Never   Tobacco comments:    08/08/2019 update, trying to cut back   Vaping Use   Vaping Use: Former  Substance Use Topics   Alcohol use: Yes    Comment: social   Drug use: Yes    Types: Marijuana   Allergies Patient has no known allergies.  Review of Systems Review of Systems  All other systems reviewed and are negative.   Physical Exam Vital Signs  I have reviewed the triage vital signs BP (!) 147/106   Pulse 62   Temp 98 F (36.7 C) (Oral)   Resp 16   Ht '5\' 10"'$  (1.778 m)   Wt 86.2 kg   SpO2 96%   BMI 27.26 kg/m  Physical Exam Vitals and nursing note reviewed.  Constitutional:      General: He is not in acute distress.    Appearance: Normal appearance.  HENT:     Mouth/Throat:     Mouth: Mucous membranes are moist.  Eyes:     Conjunctiva/sclera: Conjunctivae normal.  Cardiovascular:     Rate and Rhythm: Normal rate and regular rhythm.  Pulmonary:     Effort: Pulmonary effort is normal.  No respiratory distress.     Breath sounds: Normal breath sounds.  Abdominal:     General: Abdomen is flat.     Palpations: Abdomen is soft.     Tenderness: There is no abdominal tenderness.  Musculoskeletal:     Right lower leg: No edema.     Left lower leg: No edema.  Skin:    General: Skin is warm and dry.     Capillary Refill: Capillary refill takes less than 2 seconds.  Neurological:     General: No focal deficit present.     Mental Status: He is alert and oriented to person, place, and time. Mental status is at baseline.     Cranial Nerves: No cranial nerve deficit.  Psychiatric:        Mood and Affect: Mood is anxious.        Behavior: Behavior normal.     ED Results and Treatments Labs (all labs ordered are listed, but only abnormal results are displayed) Labs Reviewed  COMPREHENSIVE METABOLIC PANEL - Abnormal; Notable for the following components:      Result Value   Sodium 133 (*)    Glucose, Bld 112 (*)    Calcium 8.7 (*)    AST 78 (*)    ALT 133 (*)    All other components within normal limits  CBC WITH DIFFERENTIAL/PLATELET  TROPONIN I (HIGH SENSITIVITY)  Radiology DG Chest Port 1 View  Result Date: 12/29/2022 CLINICAL DATA:  Hypertension with chest pain EXAM: PORTABLE CHEST 1 VIEW COMPARISON:  Chest radiograph dated 04/18/2020 FINDINGS: Normal lung volumes. No focal consolidations. No pleural effusion or pneumothorax. The heart size and mediastinal contours are within normal limits. The visualized skeletal structures are unremarkable. IMPRESSION: No active disease. Electronically Signed   By: Darrin Nipper M.D.   On: 12/29/2022 13:02    Pertinent labs & imaging results that were available during my care of the patient were reviewed by me and considered in my medical decision making (see MDM for details).  Medications Ordered in ED Medications  - No data to display                                                                                                                                   Procedures Procedures  (including critical care time)  Medical Decision Making / ED Course   MDM:  32 year old male presenting to the emergency department with elevated blood pressure.  In the emergency department, patient blood pressure 155/102.  Physical exam overall reassuring.  Patient also having some chest pain, overall low concern for ACS given age, will check troponin x 1 since symptoms began yesterday, EKG reassuring.  Patient PERC negative, doubt PE.  Will check chest x-ray given shortness of breath and chest pain but no focal lung sounds to suggest pneumothorax, pneumonia.  Patient has not had labs for some time, given elevated blood pressure and young patient will obtain basic labs.  Patient does appear very anxious and continues to smoke.  Both of these are probably contributing to his elevated blood pressure.  Advised smoking cessation.  Since he has been compliant with his blood pressure medication for the past week, if workup otherwise negative will consider increasing to 75 mg daily and follow up with PMD.   He also reported a brief headache lasting less than 5 minutes.  He reports that it was mild.  He has history of aneurysm listed on his chart, however his most recent neuroimaging did not demonstrate any aneurysm.  His symptoms are not consistent with subarachnoid hemorrhage and his neurologic exam is normal. Doubt neuroimaging would be beneficial given complete resolution of headache and minimal symptoms.    Clinical Course as of 12/29/22 1454  Tue Dec 29, 2022  1452 Workup reassuring including negative troponin.  Patient reports feeling better after reassurance and normal testing.  Will increase his home losartan to 75 mg daily advised follow-up with primary doctor soon.  He reports he has an appointment with them early  next week. Will discharge patient to home. All questions answered. Patient comfortable with plan of discharge. Return precautions discussed with patient and specified on the after visit summary. [WS]    Clinical Course User Index [WS] Cristie Hem, MD     Additional history obtained: -Additional history obtained from spouse -External  records from outside source obtained and reviewed including: Chart review including previous notes, labs, imaging, consultation notes including telephone triage note   Lab Tests: -I ordered, reviewed, and interpreted labs.   The pertinent results include:   Labs Reviewed  COMPREHENSIVE METABOLIC PANEL - Abnormal; Notable for the following components:      Result Value   Sodium 133 (*)    Glucose, Bld 112 (*)    Calcium 8.7 (*)    AST 78 (*)    ALT 133 (*)    All other components within normal limits  CBC WITH DIFFERENTIAL/PLATELET  TROPONIN I (HIGH SENSITIVITY)    Notable for normal troponin, non specific LFT changes w/o abdominal pain or tenderness. Advised re-check with PMD  EKG   EKG Interpretation  Date/Time:  Tuesday December 29 2022 11:49:30 EST Ventricular Rate:  75 PR Interval:  123 QRS Duration: 102 QT Interval:  371 QTC Calculation: 415 R Axis:   98 Text Interpretation: Sinus rhythm Borderline right axis deviation ST elev, probable normal early repol pattern Confirmed by Garnette Gunner [phone]) on 12/29/2022 12:33:05 PM         Imaging Studies ordered: I ordered imaging studies including CXR On my interpretation imaging demonstrates no acute process I independently visualized and interpreted imaging. I agree with the radiologist interpretation   Medicines ordered and prescription drug management: Meds ordered this encounter  Medications   DISCONTD: losartan (COZAAR) 50 MG tablet    Sig: Take 1.5 tablets (75 mg total) by mouth daily. Take 1 by mouth daily    Dispense:  90 tablet    Refill:  0   losartan  (COZAAR) 50 MG tablet    Sig: Take 1.5 tablets (75 mg total) by mouth daily.    Dispense:  90 tablet    Refill:  0    -I have reviewed the patients home medicines and have made adjustments as needed    Cardiac Monitoring: The patient was maintained on a cardiac monitor.  I personally viewed and interpreted the cardiac monitored which showed an underlying rhythm of: NSR  Social Determinants of Health:  Diagnosis or treatment significantly limited by social determinants of health: current smoker   Reevaluation: After the interventions noted above, I reevaluated the patient and found that they have improved  Co morbidities that complicate the patient evaluation  Past Medical History:  Diagnosis Date   Aneurysm ([place]) 2020   Anxiety    managed per pt   Depression    "haven't really dealt with" in "quite some time"   High blood pressure    slightly elevated      Dispostion: Disposition decision including need for hospitalization was considered, and patient discharged from emergency department.    Final Clinical Impression(s) / ED Diagnoses Final diagnoses:  Hypertension, unspecified type     This chart was dictated using voice recognition software.  Despite best efforts to proofread,  errors can occur which can change the documentation meaning.    Cristie Hem, MD 12/29/22 [phone]

## 2022-12-29 NOTE — ED Notes (Signed)
D/c paperwork reviewed with pt, including prescriptions and follow up care.  No questions or concerns voiced at time of d/c. Stephen Castro Pt verbalized understanding, Ambulatory with family to ED exit, NAD.

## 2022-12-31 NOTE — Patient Instructions (Signed)
It was good to see you again today!   

## 2022-12-31 NOTE — Progress Notes (Signed)
Could not reach patient at time of virtual visit.  No show

## 2023-01-06 ENCOUNTER — Telehealth: Payer: BC Managed Care – PPO | Admitting: Family Medicine

## 2023-01-06 ENCOUNTER — Encounter: Payer: Self-pay | Admitting: Family Medicine

## 2023-01-06 DIAGNOSIS — Z91199 Patient's noncompliance with other medical treatment and regimen due to unspecified reason: Secondary | ICD-10-CM

## 2023-02-16 DIAGNOSIS — F1020 Alcohol dependence, uncomplicated: Secondary | ICD-10-CM | POA: Diagnosis not present

## 2023-02-16 DIAGNOSIS — Z5181 Encounter for therapeutic drug level monitoring: Secondary | ICD-10-CM | POA: Diagnosis not present

## 2023-03-03 DIAGNOSIS — F1020 Alcohol dependence, uncomplicated: Secondary | ICD-10-CM | POA: Diagnosis not present

## 2023-03-04 DIAGNOSIS — F1020 Alcohol dependence, uncomplicated: Secondary | ICD-10-CM | POA: Diagnosis not present

## 2023-03-05 DIAGNOSIS — F1020 Alcohol dependence, uncomplicated: Secondary | ICD-10-CM | POA: Diagnosis not present

## 2023-03-06 DIAGNOSIS — F1020 Alcohol dependence, uncomplicated: Secondary | ICD-10-CM | POA: Diagnosis not present

## 2023-03-07 DIAGNOSIS — F1020 Alcohol dependence, uncomplicated: Secondary | ICD-10-CM | POA: Diagnosis not present

## 2023-03-08 DIAGNOSIS — F1020 Alcohol dependence, uncomplicated: Secondary | ICD-10-CM | POA: Diagnosis not present

## 2023-03-09 DIAGNOSIS — F1020 Alcohol dependence, uncomplicated: Secondary | ICD-10-CM | POA: Diagnosis not present

## 2023-03-10 DIAGNOSIS — F1020 Alcohol dependence, uncomplicated: Secondary | ICD-10-CM | POA: Diagnosis not present

## 2023-03-11 DIAGNOSIS — F1020 Alcohol dependence, uncomplicated: Secondary | ICD-10-CM | POA: Diagnosis not present

## 2023-03-12 DIAGNOSIS — F1020 Alcohol dependence, uncomplicated: Secondary | ICD-10-CM | POA: Diagnosis not present

## 2023-03-13 DIAGNOSIS — F1020 Alcohol dependence, uncomplicated: Secondary | ICD-10-CM | POA: Diagnosis not present

## 2023-03-14 DIAGNOSIS — F1020 Alcohol dependence, uncomplicated: Secondary | ICD-10-CM | POA: Diagnosis not present

## 2023-03-15 DIAGNOSIS — F1020 Alcohol dependence, uncomplicated: Secondary | ICD-10-CM | POA: Diagnosis not present

## 2023-03-16 DIAGNOSIS — F1020 Alcohol dependence, uncomplicated: Secondary | ICD-10-CM | POA: Diagnosis not present

## 2023-03-17 DIAGNOSIS — F1020 Alcohol dependence, uncomplicated: Secondary | ICD-10-CM | POA: Diagnosis not present

## 2023-03-18 DIAGNOSIS — F1020 Alcohol dependence, uncomplicated: Secondary | ICD-10-CM | POA: Diagnosis not present

## 2023-03-19 DIAGNOSIS — F1020 Alcohol dependence, uncomplicated: Secondary | ICD-10-CM | POA: Diagnosis not present

## 2023-03-20 DIAGNOSIS — F1020 Alcohol dependence, uncomplicated: Secondary | ICD-10-CM | POA: Diagnosis not present

## 2023-03-21 DIAGNOSIS — F1020 Alcohol dependence, uncomplicated: Secondary | ICD-10-CM | POA: Diagnosis not present

## 2023-03-22 DIAGNOSIS — F1020 Alcohol dependence, uncomplicated: Secondary | ICD-10-CM | POA: Diagnosis not present

## 2023-03-23 DIAGNOSIS — F1020 Alcohol dependence, uncomplicated: Secondary | ICD-10-CM | POA: Diagnosis not present

## 2023-03-24 DIAGNOSIS — F1020 Alcohol dependence, uncomplicated: Secondary | ICD-10-CM | POA: Diagnosis not present

## 2023-03-25 DIAGNOSIS — F1020 Alcohol dependence, uncomplicated: Secondary | ICD-10-CM | POA: Diagnosis not present

## 2023-03-26 DIAGNOSIS — F1020 Alcohol dependence, uncomplicated: Secondary | ICD-10-CM | POA: Diagnosis not present

## 2023-03-27 DIAGNOSIS — F1020 Alcohol dependence, uncomplicated: Secondary | ICD-10-CM | POA: Diagnosis not present

## 2023-03-28 DIAGNOSIS — F1020 Alcohol dependence, uncomplicated: Secondary | ICD-10-CM | POA: Diagnosis not present

## 2023-03-29 DIAGNOSIS — F1020 Alcohol dependence, uncomplicated: Secondary | ICD-10-CM | POA: Diagnosis not present

## 2023-03-30 DIAGNOSIS — F1020 Alcohol dependence, uncomplicated: Secondary | ICD-10-CM | POA: Diagnosis not present

## 2023-03-31 ENCOUNTER — Encounter: Payer: Self-pay | Admitting: Family Medicine

## 2023-03-31 ENCOUNTER — Telehealth: Payer: Self-pay | Admitting: Family Medicine

## 2023-03-31 NOTE — Telephone Encounter (Signed)
I called patient, no answer and voicemail full We will try him again, will send a MyChart message

## 2023-03-31 NOTE — Telephone Encounter (Signed)
Pt just completed inpatient treatment and he said his wife will not let him come home until he is on "a recovery phase anti anxiety medication" so he would like to have a medication called in. Pt would like a call back to discuss.

## 2023-03-31 NOTE — Telephone Encounter (Signed)
Please see below.

## 2023-04-01 MED ORDER — NALTREXONE HCL 50 MG PO TABS: 50.0000 mg | ORAL_TABLET | Freq: Every day | ORAL | 5 refills | Status: DC

## 2023-04-01 MED ORDER — HYDROXYZINE PAMOATE 25 MG PO CAPS: 25.0000 mg | ORAL_CAPSULE | Freq: Three times a day (TID) | ORAL | 1 refills | Status: DC | PRN

## 2023-04-01 NOTE — Addendum Note (Signed)
Addended by: Abbe Amsterdam C on: 04/01/2023 12:32 PM   Modules accepted: Orders

## 2023-04-01 NOTE — Telephone Encounter (Signed)
Called him back- he went to inpt treatment for alcohol abuse and has just been released to home He wonders about medication for anxiety which would be safe for his recovery He was on vistaril while he was inpt- he was discharged home with vistaril 25 mg which he can take up to TID as needed.  He does not feel like it makes him sleepy and so far seems to be tolerating well He is also on naltrexone 25 mg daily, increase to 50 mg after 3 days; he was just given this upon discharge home, he will need a prescription for this medication He is also using nicotine patches Besides that he is taking his losartan and some vitamins  So far Stephen Castro feels that he is adjusting to home well.  He has been staying away from alcohol, other drugs such as opioids have not typically been his problem  Meds ordered this encounter  Medications   naltrexone (DEPADE) 50 MG tablet    Sig: Take 1 tablet (50 mg total) by mouth daily.    Dispense:  30 tablet    Refill:  5   hydrOXYzine (VISTARIL) 25 MG capsule    Sig: Take 1 capsule (25 mg total) by mouth every 8 (eight) hours as needed.    Dispense:  90 capsule    Refill:  1   We may want to start him on an SSRI/SNRI or BuSpar, but since he just got home I do not want to make too many changes right now.  For the time being we will continue hydroxyzine every 8 hours as needed, I also given prescription for naltrexone Made appointment to see him here in the office in 10 days, he will alert me if any concerns in the meantime

## 2023-04-05 DIAGNOSIS — F419 Anxiety disorder, unspecified: Secondary | ICD-10-CM | POA: Diagnosis not present

## 2023-04-05 DIAGNOSIS — F909 Attention-deficit hyperactivity disorder, unspecified type: Secondary | ICD-10-CM | POA: Diagnosis not present

## 2023-04-05 DIAGNOSIS — F1020 Alcohol dependence, uncomplicated: Secondary | ICD-10-CM | POA: Diagnosis not present

## 2023-04-05 DIAGNOSIS — I10 Essential (primary) hypertension: Secondary | ICD-10-CM | POA: Diagnosis not present

## 2023-04-05 DIAGNOSIS — J4520 Mild intermittent asthma, uncomplicated: Secondary | ICD-10-CM | POA: Diagnosis not present

## 2023-04-06 DIAGNOSIS — J4520 Mild intermittent asthma, uncomplicated: Secondary | ICD-10-CM | POA: Diagnosis not present

## 2023-04-06 DIAGNOSIS — F419 Anxiety disorder, unspecified: Secondary | ICD-10-CM | POA: Diagnosis not present

## 2023-04-06 DIAGNOSIS — F1020 Alcohol dependence, uncomplicated: Secondary | ICD-10-CM | POA: Diagnosis not present

## 2023-04-06 DIAGNOSIS — I10 Essential (primary) hypertension: Secondary | ICD-10-CM | POA: Diagnosis not present

## 2023-04-06 DIAGNOSIS — F909 Attention-deficit hyperactivity disorder, unspecified type: Secondary | ICD-10-CM | POA: Diagnosis not present

## 2023-04-08 DIAGNOSIS — J4520 Mild intermittent asthma, uncomplicated: Secondary | ICD-10-CM | POA: Diagnosis not present

## 2023-04-08 DIAGNOSIS — F419 Anxiety disorder, unspecified: Secondary | ICD-10-CM | POA: Diagnosis not present

## 2023-04-08 DIAGNOSIS — F909 Attention-deficit hyperactivity disorder, unspecified type: Secondary | ICD-10-CM | POA: Diagnosis not present

## 2023-04-08 DIAGNOSIS — F1020 Alcohol dependence, uncomplicated: Secondary | ICD-10-CM | POA: Diagnosis not present

## 2023-04-08 DIAGNOSIS — I10 Essential (primary) hypertension: Secondary | ICD-10-CM | POA: Diagnosis not present

## 2023-04-10 NOTE — Progress Notes (Unsigned)
[facility] at [facility] [address], [address] [place], [place] [address] [phone]  Date:  04/12/2023   Name:  Stephen Castro   DOB:  12/16/1990   MRN:  [id number]  PCP:  Pearline Cables, MD    Chief Complaint: No chief complaint on file.   History of Present Illness:  Stephen Castro is a 32 y.o. very pleasant male patient who presents with the following:  Patient seen today for follow-up- pt missed appt as he was asleep, we decided to change to a virtual visit, phone only as pt does not have access to mychart.  Connected with pt via phone, he is at home and I am at office. Pt ID confirmed with 2 factors, he gives consent for a virtual visit today.  The patient and myself are present on the call today   Most recent visit with myself was in January for sick visit.  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  He does not feel like it makes him sleepy and so far seems to be tolerating well He is also on naltrexone 25 mg daily, increase to 50 mg after 3 days; he was just given this upon discharge home, he will need a prescription for this medication He is also using nicotine patches Besides that he is taking his losartan and some vitamins  So far Casmier feels that he is adjusting to home well.  He has been staying away from alcohol, other drugs such as opioids have not typically been his problem       Meds ordered this encounter  Medications   naltrexone (DEPADE) 50 MG tablet      Sig: Take 1 tablet (50 mg total) by mouth daily.      Dispense:  30 tablet      Refill:  5   hydrOXYzine  (VISTARIL) 25 MG capsule      Sig: Take 1 capsule (25 mg total) by mouth every 8 (eight) hours as needed.      Dispense:  90 capsule      Refill:  1    We may want to start him on an SSRI/SNRI or BuSpar, but since he just got home I do not want to make too many changes right now.  For the time being we will continue hydroxyzine every 8 hours as needed, I also given prescription for naltrexone Made appointment to see him here in the office in 10 days, he will alert me if any concerns in the meantime  Pt notes he is doing an IOP 3x a week and is seeing a counselor and also going to meetings He is using naltrexone daily Melatonin for sleep He is using hydroxyzine BID - morning and night- he does feel like it helps a bit  He notes his sleep is pretty good- he may wake up 1-2x a night  We had discussed starting BuSpar for anxiety at our last discussion.  However, we did not want to make too many changes at that time since he had just gotten home from rehab.  He would like to go ahead and start on buspirone now which is reasonable  Patient Active Problem List   Diagnosis Date Noted   Chronic migraine without aura without status migrainosus, not intractable 08/08/2019   Hypertension 06/30/2019   Brain aneurysm 06/17/2019   Gastroenteritis 04/13/2019   Shoulder injury, left, initial encounter 01/24/2018   ADHD (attention deficit hyperactivity disorder) 04/04/2013   Anxiety state, unspecified 04/04/2013    Past Medical History:  Diagnosis Date   Aneurysm (HCC) 2020   Anxiety    managed per pt   Depression    "haven't really dealt with" in "quite some time"   High blood pressure    slightly elevated    No past surgical history on file.  Social History   Tobacco Use   Smoking status: Every Day    Packs/day: .5    Types: Cigarettes   Smokeless tobacco: Never   Tobacco comments:    08/08/2019 update, trying to cut back   Vaping Use   Vaping Use: Former  Substance Use Topics    Alcohol use: Yes    Comment: social   Drug use: Yes    Types: Marijuana    Family History  Problem Relation Age of Onset   Stroke Mother        carotid artery dissection during massage    Aneurysm Paternal Grandmother    Hypertension Other        both sides of family   Lung cancer Other        both sides of family    Cerebral aneurysm Maternal Uncle    Migraines Neg Hx        none that he is aware of    No Known Allergies  Medication list has been reviewed and updated.  Current Outpatient Medications on File Prior to Visit  Medication Sig Dispense Refill   albuterol (VENTOLIN HFA) 108 (90 Base) MCG/ACT inhaler INHALE 2 PUFFS INTO THE LUNGS EVERY 6 HOURS AS NEEDED FOR WHEEZING OR SHORTNESS OF BREATH 8.5 g 1   hydrOXYzine (VISTARIL) 25 MG capsule Take 1 capsule (25 mg total) by mouth every 8 (eight) hours as needed. 90 capsule 1   losartan (COZAAR) 50 MG tablet Take 1.5 tablets (75 mg total) by mouth daily. 90 tablet 0   meloxicam (MOBIC) 15 MG tablet Take 1 tablet (15 mg total) by mouth daily. 30 tablet 0   naltrexone (DEPADE) 50 MG tablet Take 1 tablet (50 mg total) by mouth daily. 30 tablet 5   tiZANidine (ZANAFLEX) 4 MG tablet Take 1 tablet (4 mg total) by mouth every 6 (six) hours as needed for muscle spasms. 30 tablet 0   No current facility-administered medications on file prior to visit.    Review of Systems:  As per HPI- otherwise negative.   Physical Examination: There were no vitals filed for this visit. There were no vitals filed for this visit. There is no height or weight on file to calculate BMI. Ideal Body Weight:   Connected with patient via telephone.  He sounds well, no shortness of breath or distress is noted  Assessment and Plan: GAD (generalized anxiety disorder) - Plan: busPIRone (BUSPAR) 15 MG tablet  Substance abuse (HCC)  Following up today on anxiety and history of substance disorder.  Patient is doing well abstaining from substance abuse,  he is doing intensive outpatient therapy as well as going to alcoholic Anonymous meetings.  Will have him start on BuSpar 7.5 twice daily for  1 week, then can increase to 15 twice daily.  I have asked him to keep posted on his progress, we scheduled an in person visit in about 1 month Spoke to patient for 12 minutes today   Signed Abbe Amsterdam, MD

## 2023-04-12 ENCOUNTER — Telehealth (INDEPENDENT_AMBULATORY_CARE_PROVIDER_SITE_OTHER): Payer: BC Managed Care – PPO | Admitting: Family Medicine

## 2023-04-12 DIAGNOSIS — F1910 Other psychoactive substance abuse, uncomplicated: Secondary | ICD-10-CM

## 2023-04-12 DIAGNOSIS — F411 Generalized anxiety disorder: Secondary | ICD-10-CM | POA: Diagnosis not present

## 2023-04-12 DIAGNOSIS — F909 Attention-deficit hyperactivity disorder, unspecified type: Secondary | ICD-10-CM | POA: Diagnosis not present

## 2023-04-12 DIAGNOSIS — I10 Essential (primary) hypertension: Secondary | ICD-10-CM | POA: Diagnosis not present

## 2023-04-12 DIAGNOSIS — F1020 Alcohol dependence, uncomplicated: Secondary | ICD-10-CM | POA: Diagnosis not present

## 2023-04-12 DIAGNOSIS — J4520 Mild intermittent asthma, uncomplicated: Secondary | ICD-10-CM | POA: Diagnosis not present

## 2023-04-12 DIAGNOSIS — F419 Anxiety disorder, unspecified: Secondary | ICD-10-CM | POA: Diagnosis not present

## 2023-04-12 MED ORDER — BUSPIRONE HCL 15 MG PO TABS: 15.0000 mg | ORAL_TABLET | Freq: Two times a day (BID) | ORAL | 2 refills | Status: DC

## 2023-04-13 DIAGNOSIS — F909 Attention-deficit hyperactivity disorder, unspecified type: Secondary | ICD-10-CM | POA: Diagnosis not present

## 2023-04-13 DIAGNOSIS — F1020 Alcohol dependence, uncomplicated: Secondary | ICD-10-CM | POA: Diagnosis not present

## 2023-04-13 DIAGNOSIS — F419 Anxiety disorder, unspecified: Secondary | ICD-10-CM | POA: Diagnosis not present

## 2023-04-13 DIAGNOSIS — J4520 Mild intermittent asthma, uncomplicated: Secondary | ICD-10-CM | POA: Diagnosis not present

## 2023-04-13 DIAGNOSIS — I10 Essential (primary) hypertension: Secondary | ICD-10-CM | POA: Diagnosis not present

## 2023-04-15 DIAGNOSIS — F1020 Alcohol dependence, uncomplicated: Secondary | ICD-10-CM | POA: Diagnosis not present

## 2023-04-15 DIAGNOSIS — F909 Attention-deficit hyperactivity disorder, unspecified type: Secondary | ICD-10-CM | POA: Diagnosis not present

## 2023-04-15 DIAGNOSIS — F419 Anxiety disorder, unspecified: Secondary | ICD-10-CM | POA: Diagnosis not present

## 2023-04-15 DIAGNOSIS — I10 Essential (primary) hypertension: Secondary | ICD-10-CM | POA: Diagnosis not present

## 2023-04-15 DIAGNOSIS — J4520 Mild intermittent asthma, uncomplicated: Secondary | ICD-10-CM | POA: Diagnosis not present

## 2023-04-19 DIAGNOSIS — F909 Attention-deficit hyperactivity disorder, unspecified type: Secondary | ICD-10-CM | POA: Diagnosis not present

## 2023-04-19 DIAGNOSIS — F1020 Alcohol dependence, uncomplicated: Secondary | ICD-10-CM | POA: Diagnosis not present

## 2023-04-19 DIAGNOSIS — J4520 Mild intermittent asthma, uncomplicated: Secondary | ICD-10-CM | POA: Diagnosis not present

## 2023-04-19 DIAGNOSIS — F419 Anxiety disorder, unspecified: Secondary | ICD-10-CM | POA: Diagnosis not present

## 2023-04-19 DIAGNOSIS — I10 Essential (primary) hypertension: Secondary | ICD-10-CM | POA: Diagnosis not present

## 2023-04-20 DIAGNOSIS — F419 Anxiety disorder, unspecified: Secondary | ICD-10-CM | POA: Diagnosis not present

## 2023-04-20 DIAGNOSIS — F909 Attention-deficit hyperactivity disorder, unspecified type: Secondary | ICD-10-CM | POA: Diagnosis not present

## 2023-04-20 DIAGNOSIS — F1020 Alcohol dependence, uncomplicated: Secondary | ICD-10-CM | POA: Diagnosis not present

## 2023-04-20 DIAGNOSIS — J4520 Mild intermittent asthma, uncomplicated: Secondary | ICD-10-CM | POA: Diagnosis not present

## 2023-04-20 DIAGNOSIS — I10 Essential (primary) hypertension: Secondary | ICD-10-CM | POA: Diagnosis not present

## 2023-04-22 DIAGNOSIS — I10 Essential (primary) hypertension: Secondary | ICD-10-CM | POA: Diagnosis not present

## 2023-04-22 DIAGNOSIS — F909 Attention-deficit hyperactivity disorder, unspecified type: Secondary | ICD-10-CM | POA: Diagnosis not present

## 2023-04-22 DIAGNOSIS — F419 Anxiety disorder, unspecified: Secondary | ICD-10-CM | POA: Diagnosis not present

## 2023-04-22 DIAGNOSIS — F1020 Alcohol dependence, uncomplicated: Secondary | ICD-10-CM | POA: Diagnosis not present

## 2023-04-22 DIAGNOSIS — J4520 Mild intermittent asthma, uncomplicated: Secondary | ICD-10-CM | POA: Diagnosis not present

## 2023-04-26 DIAGNOSIS — F419 Anxiety disorder, unspecified: Secondary | ICD-10-CM | POA: Diagnosis not present

## 2023-04-26 DIAGNOSIS — F1020 Alcohol dependence, uncomplicated: Secondary | ICD-10-CM | POA: Diagnosis not present

## 2023-04-26 DIAGNOSIS — I10 Essential (primary) hypertension: Secondary | ICD-10-CM | POA: Diagnosis not present

## 2023-04-26 DIAGNOSIS — J4520 Mild intermittent asthma, uncomplicated: Secondary | ICD-10-CM | POA: Diagnosis not present

## 2023-04-26 DIAGNOSIS — F909 Attention-deficit hyperactivity disorder, unspecified type: Secondary | ICD-10-CM | POA: Diagnosis not present

## 2023-04-27 DIAGNOSIS — J4520 Mild intermittent asthma, uncomplicated: Secondary | ICD-10-CM | POA: Diagnosis not present

## 2023-04-27 DIAGNOSIS — I10 Essential (primary) hypertension: Secondary | ICD-10-CM | POA: Diagnosis not present

## 2023-04-27 DIAGNOSIS — F909 Attention-deficit hyperactivity disorder, unspecified type: Secondary | ICD-10-CM | POA: Diagnosis not present

## 2023-04-27 DIAGNOSIS — F419 Anxiety disorder, unspecified: Secondary | ICD-10-CM | POA: Diagnosis not present

## 2023-04-27 DIAGNOSIS — F1020 Alcohol dependence, uncomplicated: Secondary | ICD-10-CM | POA: Diagnosis not present

## 2023-04-29 DIAGNOSIS — F909 Attention-deficit hyperactivity disorder, unspecified type: Secondary | ICD-10-CM | POA: Diagnosis not present

## 2023-04-29 DIAGNOSIS — J4520 Mild intermittent asthma, uncomplicated: Secondary | ICD-10-CM | POA: Diagnosis not present

## 2023-04-29 DIAGNOSIS — F1020 Alcohol dependence, uncomplicated: Secondary | ICD-10-CM | POA: Diagnosis not present

## 2023-04-29 DIAGNOSIS — I10 Essential (primary) hypertension: Secondary | ICD-10-CM | POA: Diagnosis not present

## 2023-04-29 DIAGNOSIS — F419 Anxiety disorder, unspecified: Secondary | ICD-10-CM | POA: Diagnosis not present

## 2023-05-03 DIAGNOSIS — I10 Essential (primary) hypertension: Secondary | ICD-10-CM | POA: Diagnosis not present

## 2023-05-03 DIAGNOSIS — F909 Attention-deficit hyperactivity disorder, unspecified type: Secondary | ICD-10-CM | POA: Diagnosis not present

## 2023-05-03 DIAGNOSIS — F419 Anxiety disorder, unspecified: Secondary | ICD-10-CM | POA: Diagnosis not present

## 2023-05-03 DIAGNOSIS — F1020 Alcohol dependence, uncomplicated: Secondary | ICD-10-CM | POA: Diagnosis not present

## 2023-05-03 DIAGNOSIS — J4520 Mild intermittent asthma, uncomplicated: Secondary | ICD-10-CM | POA: Diagnosis not present

## 2023-05-04 DIAGNOSIS — F1020 Alcohol dependence, uncomplicated: Secondary | ICD-10-CM | POA: Diagnosis not present

## 2023-05-04 DIAGNOSIS — I10 Essential (primary) hypertension: Secondary | ICD-10-CM | POA: Diagnosis not present

## 2023-05-04 DIAGNOSIS — F419 Anxiety disorder, unspecified: Secondary | ICD-10-CM | POA: Diagnosis not present

## 2023-05-04 DIAGNOSIS — F909 Attention-deficit hyperactivity disorder, unspecified type: Secondary | ICD-10-CM | POA: Diagnosis not present

## 2023-05-04 DIAGNOSIS — J4520 Mild intermittent asthma, uncomplicated: Secondary | ICD-10-CM | POA: Diagnosis not present

## 2023-05-04 NOTE — Progress Notes (Unsigned)
[facility]care at [facility] [address] [place], [place] [address] [phone]  Date:  05/05/2023   Name:  Stephen Castro   DOB:  [birth year]   MRN:  [id number]  PCP:  Pearline Cables, MD    Chief Complaint: No chief complaint on file.   History of Present Illness:  Stephen Castro is a 32 y.o. very pleasant male patient who presents with the following:  Patient seen today for follow-up Most recent visit with myself was a virtual about 3 weeks ago History of hypertension, migraine headache, ADHD, anxiety, alcohol and nitrous oxide abuse  At her most recent visit he was using naltrexone daily to reinforce sobriety.  He was doing intensive outpatient program 3 times a week and also seeing a counselor We started on BuSpar at his last visit  He was seen by neurology in April 2023 to follow-up history of incidentally discovered cerebral aneurysm And MR angiogram was recommended and completed in December 2023-unremarkable  May be due for tetanus updated  Patient Active Problem List   Diagnosis Date Noted   Chronic migraine without aura without status migrainosus, not intractable 08/08/2019   Hypertension 06/30/2019   Brain aneurysm 06/17/2019   Gastroenteritis 04/13/2019   Shoulder injury, left, initial encounter 01/24/2018   ADHD (attention deficit hyperactivity disorder) 04/04/2013   Anxiety state, unspecified 04/04/2013    Past Medical History:  Diagnosis Date   Aneurysm (HCC) 2020   Anxiety    managed per pt   Depression    "haven't really dealt with" in "quite some time"   High blood pressure    slightly elevated    No past surgical history on file.  Social History   Tobacco Use   Smoking status: Every Day    Packs/day: .5    Types: Cigarettes   Smokeless tobacco: Never   Tobacco comments:    08/08/2019 update, trying to cut back   Vaping Use   Vaping Use: Former  Substance Use Topics   Alcohol use: Yes     Comment: social   Drug use: Yes    Types: Marijuana    Family History  Problem Relation Age of Onset   Stroke Mother        carotid artery dissection during massage    Aneurysm Paternal Grandmother    Hypertension Other        both sides of family   Lung cancer Other        both sides of family    Cerebral aneurysm Maternal Uncle    Migraines Neg Hx        none that he is aware of    No Known Allergies  Medication list has been reviewed and updated.  Current Outpatient Medications on File Prior to Visit  Medication Sig Dispense Refill   albuterol (VENTOLIN HFA) 108 (90 Base) MCG/ACT inhaler INHALE 2 PUFFS INTO THE LUNGS EVERY 6 HOURS AS NEEDED FOR WHEEZING OR SHORTNESS OF BREATH 8.5 g 1   busPIRone (BUSPAR) 15 MG tablet Take 1 tablet (15 mg total) by mouth 2 (two) times daily. Start 7.5 mg BID- increase by 15 mg BID after one week 30 tablet 2   hydrOXYzine (VISTARIL) 25 MG capsule Take 1 capsule (25 mg total) by mouth every 8 (eight) hours as needed. 90 capsule 1   losartan (COZAAR) 50 MG tablet Take 1.5 tablets (75 mg total) by mouth daily. 90 tablet 0  meloxicam (MOBIC) 15 MG tablet Take 1 tablet (15 mg total) by mouth daily. 30 tablet 0   naltrexone (DEPADE) 50 MG tablet Take 1 tablet (50 mg total) by mouth daily. 30 tablet 5   tiZANidine (ZANAFLEX) 4 MG tablet Take 1 tablet (4 mg total) by mouth every 6 (six) hours as needed for muscle spasms. 30 tablet 0   No current facility-administered medications on file prior to visit.    Review of Systems:  As per HPI- otherwise negative.   Physical Examination: There were no vitals filed for this visit. There were no vitals filed for this visit. There is no height or weight on file to calculate BMI. Ideal Body Weight:    GEN: no acute distress. HEENT: Atraumatic, Normocephalic.  Ears and Nose: No external deformity. CV: RRR, No M/G/R. No JVD. No thrill. No extra heart sounds. PULM: CTA B, no wheezes, crackles,  rhonchi. No retractions. No resp. distress. No accessory muscle use. ABD: S, NT, ND, +BS. No rebound. No HSM. EXTR: No c/c/e PSYCH: Normally interactive. Conversant.    Assessment and Plan: ***  Signed Abbe Amsterdam, MD

## 2023-05-05 ENCOUNTER — Ambulatory Visit (INDEPENDENT_AMBULATORY_CARE_PROVIDER_SITE_OTHER): Payer: BC Managed Care – PPO | Admitting: Family Medicine

## 2023-05-05 ENCOUNTER — Telehealth: Payer: Self-pay

## 2023-05-05 ENCOUNTER — Encounter: Payer: Self-pay | Admitting: Family Medicine

## 2023-05-05 VITALS — BP 135/90 | HR 79 | Temp 97.6°F | Resp 18 | Ht 70.0 in | Wt 193.2 lb

## 2023-05-05 DIAGNOSIS — I10 Essential (primary) hypertension: Secondary | ICD-10-CM | POA: Diagnosis not present

## 2023-05-05 DIAGNOSIS — R7401 Elevation of levels of liver transaminase levels: Secondary | ICD-10-CM

## 2023-05-05 DIAGNOSIS — F411 Generalized anxiety disorder: Secondary | ICD-10-CM | POA: Diagnosis not present

## 2023-05-05 DIAGNOSIS — Z720 Tobacco use: Secondary | ICD-10-CM | POA: Diagnosis not present

## 2023-05-05 DIAGNOSIS — L700 Acne vulgaris: Secondary | ICD-10-CM

## 2023-05-05 DIAGNOSIS — F1910 Other psychoactive substance abuse, uncomplicated: Secondary | ICD-10-CM | POA: Diagnosis not present

## 2023-05-05 MED ORDER — NICOTINE 21 MG/24HR TD PT24: 21.0000 mg | MEDICATED_PATCH | Freq: Every day | TRANSDERMAL | 99 refills | Status: DC

## 2023-05-05 MED ORDER — BUSPIRONE HCL 15 MG PO TABS: 15.0000 mg | ORAL_TABLET | Freq: Three times a day (TID) | ORAL | 2 refills | Status: DC

## 2023-05-05 MED ORDER — TRETINOIN 0.05 % EX CREA: TOPICAL_CREAM | Freq: Every day | CUTANEOUS | 2 refills | Status: AC

## 2023-05-05 MED ORDER — NICOTINE 14 MG/24HR TD PT24: 14.0000 mg | MEDICATED_PATCH | Freq: Every day | TRANSDERMAL | 99 refills | Status: DC

## 2023-05-05 MED ORDER — LOSARTAN POTASSIUM 50 MG PO TABS: 50.0000 mg | ORAL_TABLET | Freq: Every day | ORAL | 3 refills | Status: DC

## 2023-05-05 MED ORDER — BUSPIRONE HCL 15 MG PO TABS: 22.5000 mg | ORAL_TABLET | Freq: Two times a day (BID) | ORAL | 2 refills | Status: DC

## 2023-05-05 MED ORDER — NALTREXONE HCL 50 MG PO TABS: 50.0000 mg | ORAL_TABLET | Freq: Every day | ORAL | 1 refills | Status: DC

## 2023-05-05 NOTE — Patient Instructions (Addendum)
It was good to see you today, I am glad to see you doing so well! If you back on losartan 50 mg once daily  Can increase your BuSpar to a tablet and a half twice a day=22.5 mg  Let me look into other options for treating ADHD that would be safe for you  Can decrease your dose of the nicotine patch to 14 when you feel ready  Please touch base with me in about one month with an update- sooner if needed

## 2023-05-05 NOTE — Telephone Encounter (Signed)
[facility] called for clarification on Buspar Rx. I was unable to help since I could not determine how you want it to be take. Please advise and I will give them a call back.

## 2023-05-06 DIAGNOSIS — F419 Anxiety disorder, unspecified: Secondary | ICD-10-CM | POA: Diagnosis not present

## 2023-05-06 DIAGNOSIS — J4520 Mild intermittent asthma, uncomplicated: Secondary | ICD-10-CM | POA: Diagnosis not present

## 2023-05-06 DIAGNOSIS — F1020 Alcohol dependence, uncomplicated: Secondary | ICD-10-CM | POA: Diagnosis not present

## 2023-05-06 DIAGNOSIS — I10 Essential (primary) hypertension: Secondary | ICD-10-CM | POA: Diagnosis not present

## 2023-05-06 DIAGNOSIS — F909 Attention-deficit hyperactivity disorder, unspecified type: Secondary | ICD-10-CM | POA: Diagnosis not present

## 2023-05-10 DIAGNOSIS — F1020 Alcohol dependence, uncomplicated: Secondary | ICD-10-CM | POA: Diagnosis not present

## 2023-05-10 DIAGNOSIS — F909 Attention-deficit hyperactivity disorder, unspecified type: Secondary | ICD-10-CM | POA: Diagnosis not present

## 2023-05-10 DIAGNOSIS — I10 Essential (primary) hypertension: Secondary | ICD-10-CM | POA: Diagnosis not present

## 2023-05-10 DIAGNOSIS — F419 Anxiety disorder, unspecified: Secondary | ICD-10-CM | POA: Diagnosis not present

## 2023-05-10 DIAGNOSIS — J4520 Mild intermittent asthma, uncomplicated: Secondary | ICD-10-CM | POA: Diagnosis not present

## 2023-05-11 DIAGNOSIS — F419 Anxiety disorder, unspecified: Secondary | ICD-10-CM | POA: Diagnosis not present

## 2023-05-11 DIAGNOSIS — I10 Essential (primary) hypertension: Secondary | ICD-10-CM | POA: Diagnosis not present

## 2023-05-11 DIAGNOSIS — F1020 Alcohol dependence, uncomplicated: Secondary | ICD-10-CM | POA: Diagnosis not present

## 2023-05-11 DIAGNOSIS — F909 Attention-deficit hyperactivity disorder, unspecified type: Secondary | ICD-10-CM | POA: Diagnosis not present

## 2023-05-11 DIAGNOSIS — J4520 Mild intermittent asthma, uncomplicated: Secondary | ICD-10-CM | POA: Diagnosis not present

## 2023-05-13 DIAGNOSIS — J4520 Mild intermittent asthma, uncomplicated: Secondary | ICD-10-CM | POA: Diagnosis not present

## 2023-05-13 DIAGNOSIS — F419 Anxiety disorder, unspecified: Secondary | ICD-10-CM | POA: Diagnosis not present

## 2023-05-13 DIAGNOSIS — I10 Essential (primary) hypertension: Secondary | ICD-10-CM | POA: Diagnosis not present

## 2023-05-13 DIAGNOSIS — F909 Attention-deficit hyperactivity disorder, unspecified type: Secondary | ICD-10-CM | POA: Diagnosis not present

## 2023-05-13 DIAGNOSIS — F1020 Alcohol dependence, uncomplicated: Secondary | ICD-10-CM | POA: Diagnosis not present

## 2023-05-17 DIAGNOSIS — I10 Essential (primary) hypertension: Secondary | ICD-10-CM | POA: Diagnosis not present

## 2023-05-17 DIAGNOSIS — J4520 Mild intermittent asthma, uncomplicated: Secondary | ICD-10-CM | POA: Diagnosis not present

## 2023-05-17 DIAGNOSIS — F419 Anxiety disorder, unspecified: Secondary | ICD-10-CM | POA: Diagnosis not present

## 2023-05-17 DIAGNOSIS — F909 Attention-deficit hyperactivity disorder, unspecified type: Secondary | ICD-10-CM | POA: Diagnosis not present

## 2023-05-17 DIAGNOSIS — F1020 Alcohol dependence, uncomplicated: Secondary | ICD-10-CM | POA: Diagnosis not present

## 2023-05-18 DIAGNOSIS — I10 Essential (primary) hypertension: Secondary | ICD-10-CM | POA: Diagnosis not present

## 2023-05-18 DIAGNOSIS — F909 Attention-deficit hyperactivity disorder, unspecified type: Secondary | ICD-10-CM | POA: Diagnosis not present

## 2023-05-18 DIAGNOSIS — J4520 Mild intermittent asthma, uncomplicated: Secondary | ICD-10-CM | POA: Diagnosis not present

## 2023-05-18 DIAGNOSIS — F419 Anxiety disorder, unspecified: Secondary | ICD-10-CM | POA: Diagnosis not present

## 2023-05-18 DIAGNOSIS — F1020 Alcohol dependence, uncomplicated: Secondary | ICD-10-CM | POA: Diagnosis not present

## 2023-05-24 DIAGNOSIS — J4520 Mild intermittent asthma, uncomplicated: Secondary | ICD-10-CM | POA: Diagnosis not present

## 2023-05-24 DIAGNOSIS — I10 Essential (primary) hypertension: Secondary | ICD-10-CM | POA: Diagnosis not present

## 2023-05-24 DIAGNOSIS — F419 Anxiety disorder, unspecified: Secondary | ICD-10-CM | POA: Diagnosis not present

## 2023-05-24 DIAGNOSIS — F1020 Alcohol dependence, uncomplicated: Secondary | ICD-10-CM | POA: Diagnosis not present

## 2023-05-24 DIAGNOSIS — F909 Attention-deficit hyperactivity disorder, unspecified type: Secondary | ICD-10-CM | POA: Diagnosis not present

## 2023-05-25 DIAGNOSIS — F419 Anxiety disorder, unspecified: Secondary | ICD-10-CM | POA: Diagnosis not present

## 2023-05-25 DIAGNOSIS — I10 Essential (primary) hypertension: Secondary | ICD-10-CM | POA: Diagnosis not present

## 2023-05-25 DIAGNOSIS — F909 Attention-deficit hyperactivity disorder, unspecified type: Secondary | ICD-10-CM | POA: Diagnosis not present

## 2023-05-25 DIAGNOSIS — F1020 Alcohol dependence, uncomplicated: Secondary | ICD-10-CM | POA: Diagnosis not present

## 2023-05-25 DIAGNOSIS — J4520 Mild intermittent asthma, uncomplicated: Secondary | ICD-10-CM | POA: Diagnosis not present

## 2023-05-27 DIAGNOSIS — I10 Essential (primary) hypertension: Secondary | ICD-10-CM | POA: Diagnosis not present

## 2023-05-27 DIAGNOSIS — J4520 Mild intermittent asthma, uncomplicated: Secondary | ICD-10-CM | POA: Diagnosis not present

## 2023-05-27 DIAGNOSIS — F1020 Alcohol dependence, uncomplicated: Secondary | ICD-10-CM | POA: Diagnosis not present

## 2023-05-27 DIAGNOSIS — F419 Anxiety disorder, unspecified: Secondary | ICD-10-CM | POA: Diagnosis not present

## 2023-05-27 DIAGNOSIS — F909 Attention-deficit hyperactivity disorder, unspecified type: Secondary | ICD-10-CM | POA: Diagnosis not present

## 2023-06-23 DIAGNOSIS — M9902 Segmental and somatic dysfunction of thoracic region: Secondary | ICD-10-CM | POA: Diagnosis not present

## 2023-06-23 DIAGNOSIS — M9901 Segmental and somatic dysfunction of cervical region: Secondary | ICD-10-CM | POA: Diagnosis not present

## 2023-06-23 DIAGNOSIS — M6240 Contracture of muscle, unspecified site: Secondary | ICD-10-CM | POA: Diagnosis not present

## 2023-06-23 DIAGNOSIS — M9903 Segmental and somatic dysfunction of lumbar region: Secondary | ICD-10-CM | POA: Diagnosis not present

## 2023-06-25 DIAGNOSIS — M9902 Segmental and somatic dysfunction of thoracic region: Secondary | ICD-10-CM | POA: Diagnosis not present

## 2023-06-25 DIAGNOSIS — M9901 Segmental and somatic dysfunction of cervical region: Secondary | ICD-10-CM | POA: Diagnosis not present

## 2023-06-25 DIAGNOSIS — M6240 Contracture of muscle, unspecified site: Secondary | ICD-10-CM | POA: Diagnosis not present

## 2023-06-25 DIAGNOSIS — M9903 Segmental and somatic dysfunction of lumbar region: Secondary | ICD-10-CM | POA: Diagnosis not present

## 2023-06-28 DIAGNOSIS — M9903 Segmental and somatic dysfunction of lumbar region: Secondary | ICD-10-CM | POA: Diagnosis not present

## 2023-06-28 DIAGNOSIS — M9902 Segmental and somatic dysfunction of thoracic region: Secondary | ICD-10-CM | POA: Diagnosis not present

## 2023-06-28 DIAGNOSIS — M9901 Segmental and somatic dysfunction of cervical region: Secondary | ICD-10-CM | POA: Diagnosis not present

## 2023-06-28 DIAGNOSIS — M6240 Contracture of muscle, unspecified site: Secondary | ICD-10-CM | POA: Diagnosis not present

## 2023-07-06 DIAGNOSIS — M6240 Contracture of muscle, unspecified site: Secondary | ICD-10-CM | POA: Diagnosis not present

## 2023-07-06 DIAGNOSIS — M9901 Segmental and somatic dysfunction of cervical region: Secondary | ICD-10-CM | POA: Diagnosis not present

## 2023-07-06 DIAGNOSIS — M9902 Segmental and somatic dysfunction of thoracic region: Secondary | ICD-10-CM | POA: Diagnosis not present

## 2023-07-06 DIAGNOSIS — M9903 Segmental and somatic dysfunction of lumbar region: Secondary | ICD-10-CM | POA: Diagnosis not present

## 2023-07-09 DIAGNOSIS — M9903 Segmental and somatic dysfunction of lumbar region: Secondary | ICD-10-CM | POA: Diagnosis not present

## 2023-07-09 DIAGNOSIS — M9901 Segmental and somatic dysfunction of cervical region: Secondary | ICD-10-CM | POA: Diagnosis not present

## 2023-07-09 DIAGNOSIS — M9902 Segmental and somatic dysfunction of thoracic region: Secondary | ICD-10-CM | POA: Diagnosis not present

## 2023-07-09 DIAGNOSIS — M6240 Contracture of muscle, unspecified site: Secondary | ICD-10-CM | POA: Diagnosis not present

## 2023-07-13 DIAGNOSIS — M9903 Segmental and somatic dysfunction of lumbar region: Secondary | ICD-10-CM | POA: Diagnosis not present

## 2023-07-13 DIAGNOSIS — M9901 Segmental and somatic dysfunction of cervical region: Secondary | ICD-10-CM | POA: Diagnosis not present

## 2023-07-13 DIAGNOSIS — M6240 Contracture of muscle, unspecified site: Secondary | ICD-10-CM | POA: Diagnosis not present

## 2023-07-13 DIAGNOSIS — M9902 Segmental and somatic dysfunction of thoracic region: Secondary | ICD-10-CM | POA: Diagnosis not present

## 2023-07-15 DIAGNOSIS — M9902 Segmental and somatic dysfunction of thoracic region: Secondary | ICD-10-CM | POA: Diagnosis not present

## 2023-07-15 DIAGNOSIS — M6240 Contracture of muscle, unspecified site: Secondary | ICD-10-CM | POA: Diagnosis not present

## 2023-07-15 DIAGNOSIS — M9901 Segmental and somatic dysfunction of cervical region: Secondary | ICD-10-CM | POA: Diagnosis not present

## 2023-07-15 DIAGNOSIS — M9903 Segmental and somatic dysfunction of lumbar region: Secondary | ICD-10-CM | POA: Diagnosis not present

## 2023-07-20 DIAGNOSIS — M9903 Segmental and somatic dysfunction of lumbar region: Secondary | ICD-10-CM | POA: Diagnosis not present

## 2023-07-20 DIAGNOSIS — M9902 Segmental and somatic dysfunction of thoracic region: Secondary | ICD-10-CM | POA: Diagnosis not present

## 2023-07-20 DIAGNOSIS — M6240 Contracture of muscle, unspecified site: Secondary | ICD-10-CM | POA: Diagnosis not present

## 2023-07-20 DIAGNOSIS — M9901 Segmental and somatic dysfunction of cervical region: Secondary | ICD-10-CM | POA: Diagnosis not present

## 2023-07-22 DIAGNOSIS — M6240 Contracture of muscle, unspecified site: Secondary | ICD-10-CM | POA: Diagnosis not present

## 2023-07-22 DIAGNOSIS — M9901 Segmental and somatic dysfunction of cervical region: Secondary | ICD-10-CM | POA: Diagnosis not present

## 2023-07-22 DIAGNOSIS — M9903 Segmental and somatic dysfunction of lumbar region: Secondary | ICD-10-CM | POA: Diagnosis not present

## 2023-07-22 DIAGNOSIS — M9902 Segmental and somatic dysfunction of thoracic region: Secondary | ICD-10-CM | POA: Diagnosis not present

## 2023-07-27 DIAGNOSIS — M6240 Contracture of muscle, unspecified site: Secondary | ICD-10-CM | POA: Diagnosis not present

## 2023-07-27 DIAGNOSIS — M9902 Segmental and somatic dysfunction of thoracic region: Secondary | ICD-10-CM | POA: Diagnosis not present

## 2023-07-27 DIAGNOSIS — M9903 Segmental and somatic dysfunction of lumbar region: Secondary | ICD-10-CM | POA: Diagnosis not present

## 2023-07-27 DIAGNOSIS — M9901 Segmental and somatic dysfunction of cervical region: Secondary | ICD-10-CM | POA: Diagnosis not present

## 2023-07-29 DIAGNOSIS — M9903 Segmental and somatic dysfunction of lumbar region: Secondary | ICD-10-CM | POA: Diagnosis not present

## 2023-07-29 DIAGNOSIS — M9901 Segmental and somatic dysfunction of cervical region: Secondary | ICD-10-CM | POA: Diagnosis not present

## 2023-07-29 DIAGNOSIS — M9902 Segmental and somatic dysfunction of thoracic region: Secondary | ICD-10-CM | POA: Diagnosis not present

## 2023-07-29 DIAGNOSIS — M6240 Contracture of muscle, unspecified site: Secondary | ICD-10-CM | POA: Diagnosis not present

## 2023-08-03 ENCOUNTER — Ambulatory Visit (INDEPENDENT_AMBULATORY_CARE_PROVIDER_SITE_OTHER): Payer: BC Managed Care – PPO | Admitting: Family Medicine

## 2023-08-03 ENCOUNTER — Encounter: Payer: Self-pay | Admitting: Family Medicine

## 2023-08-03 ENCOUNTER — Ambulatory Visit (HOSPITAL_BASED_OUTPATIENT_CLINIC_OR_DEPARTMENT_OTHER)
Admission: RE | Admit: 2023-08-03 | Discharge: 2023-08-03 | Disposition: A | Discharge status: Home / Self Care | Payer: BC Managed Care – PPO | Source: Ambulatory Visit | Attending: Family Medicine | Admitting: Family Medicine

## 2023-08-03 VITALS — BP 122/72 | HR 64 | Temp 98.7°F | Resp 18 | Ht 70.0 in | Wt 193.6 lb

## 2023-08-03 DIAGNOSIS — M79606 Pain in leg, unspecified: Secondary | ICD-10-CM | POA: Diagnosis not present

## 2023-08-03 DIAGNOSIS — M545 Low back pain, unspecified: Secondary | ICD-10-CM

## 2023-08-03 DIAGNOSIS — F411 Generalized anxiety disorder: Secondary | ICD-10-CM | POA: Diagnosis not present

## 2023-08-03 DIAGNOSIS — R051 Acute cough: Secondary | ICD-10-CM

## 2023-08-03 MED ORDER — CYCLOBENZAPRINE HCL 10 MG PO TABS: 10.0000 mg | ORAL_TABLET | Freq: Three times a day (TID) | ORAL | 0 refills | Status: DC | PRN

## 2023-08-03 MED ORDER — MELOXICAM 15 MG PO TABS: 15.0000 mg | ORAL_TABLET | Freq: Every day | ORAL | 2 refills | Status: DC

## 2023-08-03 MED ORDER — BUSPIRONE HCL 15 MG PO TABS: 22.5000 mg | ORAL_TABLET | Freq: Two times a day (BID) | ORAL | 2 refills | Status: AC

## 2023-08-03 MED ORDER — ALBUTEROL SULFATE HFA 108 (90 BASE) MCG/ACT IN AERS: 2.0000 | INHALATION_SPRAY | Freq: Four times a day (QID) | RESPIRATORY_TRACT | 1 refills | Status: DC | PRN

## 2023-08-03 NOTE — Patient Instructions (Signed)
Low Back Sprain or Strain Rehab Ask your health care provider which exercises are safe for you. Do exercises exactly as told by your health care provider and adjust them as directed. It is normal to feel mild stretching, pulling, tightness, or discomfort as you do these exercises. Stop right away if you feel sudden pain or your pain gets worse. Do not begin these exercises until told by your health care provider. Stretching and range-of-motion exercises These exercises warm up your muscles and joints and improve the movement and flexibility of your back. These exercises also help to relieve pain, numbness, and tingling. Lumbar rotation  Lie on your back on a firm bed or the floor with your knees bent. Straighten your arms out to your sides so each arm forms a 90-degree angle (right angle) with a side of your body. Slowly move (rotate) both of your knees to one side of your body until you feel a stretch in your lower back (lumbar). Try not to let your shoulders lift off the floor. Hold this position for __________ seconds. Tense your abdominal muscles and slowly move your knees back to the starting position. Repeat this exercise on the other side of your body. Repeat __________ times. Complete this exercise __________ times a day. Single knee to chest  Lie on your back on a firm bed or the floor with both legs straight. Bend one of your knees. Use your hands to move your knee up toward your chest until you feel a gentle stretch in your lower back and buttock. Hold your leg in this position by holding on to the front of your knee. Keep your other leg as straight as possible. Hold this position for __________ seconds. Slowly return to the starting position. Repeat with your other leg. Repeat __________ times. Complete this exercise __________ times a day. Prone extension on elbows  Lie on your abdomen on a firm bed or the floor (prone position). Prop yourself up on your elbows. Use your arms  to help lift your chest up until you feel a gentle stretch in your abdomen and your lower back. This will place some of your body weight on your elbows. If this is uncomfortable, try stacking pillows under your chest. Your hips should stay down, against the surface that you are lying on. Keep your hip and back muscles relaxed. Hold this position for __________ seconds. Slowly relax your upper body and return to the starting position. Repeat __________ times. Complete this exercise __________ times a day. Strengthening exercises These exercises build strength and endurance in your back. Endurance is the ability to use your muscles for a long time, even after they get tired. Pelvic tilt This exercise strengthens the muscles that lie deep in the abdomen. Lie on your back on a firm bed or the floor with your legs extended. Bend your knees so they are pointing toward the ceiling and your feet are flat on the floor. Tighten your lower abdominal muscles to press your lower back against the floor. This motion will tilt your pelvis so your tailbone points up toward the ceiling instead of pointing to your feet or the floor. To help with this exercise, you may place a small towel under your lower back and try to push your back into the towel. Hold this position for __________ seconds. Let your muscles relax completely before you repeat this exercise. Repeat __________ times. Complete this exercise __________ times a day. Alternating arm and leg raises  Get on your hands  and knees on a firm surface. If you are on a hard floor, you may want to use padding, such as an exercise mat, to cushion your knees. Line up your arms and legs. Your hands should be directly below your shoulders, and your knees should be directly below your hips. Lift your left leg behind you. At the same time, raise your right arm and straighten it in front of you. Do not lift your leg higher than your hip. Do not lift your arm higher  than your shoulder. Keep your abdominal and back muscles tight. Keep your hips facing the ground. Do not arch your back. Keep your balance carefully, and do not hold your breath. Hold this position for __________ seconds. Slowly return to the starting position. Repeat with your right leg and your left arm. Repeat __________ times. Complete this exercise __________ times a day. Abdominal set with straight leg raise  Lie on your back on a firm bed or the floor. Bend one of your knees and keep your other leg straight. Tense your abdominal muscles and lift your straight leg up, 4-6 inches (10-15 cm) off the ground. Keep your abdominal muscles tight and hold this position for __________ seconds. Do not hold your breath. Do not arch your back. Keep it flat against the ground. Keep your abdominal muscles tense as you slowly lower your leg back to the starting position. Repeat with your other leg. Repeat __________ times. Complete this exercise __________ times a day. Single leg lower with bent knees Lie on your back on a firm bed or the floor. Tense your abdominal muscles and lift your feet off the floor, one foot at a time, so your knees and hips are bent in 90-degree angles (right angles). Your knees should be over your hips and your lower legs should be parallel to the floor. Keeping your abdominal muscles tense and your knee bent, slowly lower one of your legs so your toe touches the ground. Lift your leg back up to return to the starting position. Do not hold your breath. Do not let your back arch. Keep your back flat against the ground. Repeat with your other leg. Repeat __________ times. Complete this exercise __________ times a day. Posture and body mechanics Good posture and healthy body mechanics can help to relieve stress in your body's tissues and joints. Body mechanics refers to the movements and positions of your body while you do your daily activities. Posture is part of body  mechanics. Good posture means: Your spine is in its natural S-curve position (neutral). Your shoulders are pulled back slightly. Your head is not tipped forward (neutral). Follow these guidelines to improve your posture and body mechanics in your everyday activities. Standing  When standing, keep your spine neutral and your feet about hip-width apart. Keep a slight bend in your knees. Your ears, shoulders, and hips should line up. When you do a task in which you stand in one place for a long time, place one foot up on a stable object that is 2-4 inches (5-10 cm) high, such as a footstool. This helps keep your spine neutral. Sitting  When sitting, keep your spine neutral and keep your feet flat on the floor. Use a footrest, if necessary, and keep your thighs parallel to the floor. Avoid rounding your shoulders, and avoid tilting your head forward. When working at a desk or a computer, keep your desk at a height where your hands are slightly lower than your elbows. Slide your  chair under your desk so you are close enough to maintain good posture. When working at a computer, place your monitor at a height where you are looking straight ahead and you do not have to tilt your head forward or downward to look at the screen. Resting When lying down and resting, avoid positions that are most painful for you. If you have pain with activities such as sitting, bending, stooping, or squatting, lie in a position in which your body does not bend very much. For example, avoid curling up on your side with your arms and knees near your chest (fetal position). If you have pain with activities such as standing for a long time or reaching with your arms, lie with your spine in a neutral position and bend your knees slightly. Try the following positions: Lying on your side with a pillow between your knees. Lying on your back with a pillow under your knees. Lifting  When lifting objects, keep your feet at least  shoulder-width apart and tighten your abdominal muscles. Bend your knees and hips and keep your spine neutral. It is important to lift using the strength of your legs, not your back. Do not lock your knees straight out. Always ask for help to lift heavy or awkward objects. This information is not intended to replace advice given to you by your health care provider. Make sure you discuss any questions you have with your health care provider. Document Revised: 02/22/2023 Document Reviewed: 01/06/2021 Elsevier Patient Education  2024 [employer].

## 2023-08-03 NOTE — Progress Notes (Signed)
Established Patient Office Visit  Subjective   Patient ID: Stephen Castro, male    DOB: 12-24-1990  Age: 32 y.o. MRN: [id number]  Chief Complaint  Patient presents with   Back Pain    X2 weeks, no falls or injuries. Pt states having tingling in the toes and fingers.     HPI Discussed the use of AI scribe software for clinical note transcription with the patient, who gave verbal consent to proceed.  History of Present Illness   The patient, with a history of aneurysm, presents with a 2-week history of recurrent lower back pain. The pain is described as 'electricity in my lower lumbar' and 'lightning in my lower back.' The pain is severe enough to disrupt sleep and is not relieved by chiropractic treatment, acupuncture, massage therapy, ibuprofen, or Tylenol. The patient also reports a sensation of a vertebra bulging in the lower back. The pain is associated with a 'restless leg kind of feeling' and a pulsing sensation that radiates to the fingers and toes. The patient denies any recent trauma or heavy lifting but has recently taken up golf. The patient has been trying to quit smoking and has been abstinent from drugs and alcohol for six months.       History of Present Illness   The patient, with a history of aneurysm, presents with a 2-week history of recurrent lower back pain. The pain is described as 'electricity in my lower lumbar' and 'lightning in my lower back.' The pain is severe enough to disrupt sleep and is not relieved by chiropractic treatment, acupuncture, massage therapy, ibuprofen, or Tylenol. The patient also reports a sensation of a vertebra bulging in the lower back. The pain is associated with a 'restless leg kind of feeling' and a pulsing sensation that radiates to the fingers and toes. The patient denies any recent trauma or heavy lifting but has recently taken up golf. The patient has been trying to quit smoking and has been abstinent from drugs and alcohol for six  months.      Patient Active Problem List   Diagnosis Date Noted   Chronic migraine without aura without status migrainosus, not intractable 08/08/2019   Hypertension 06/30/2019   Brain aneurysm 06/17/2019   Gastroenteritis 04/13/2019   Shoulder injury, left, initial encounter 01/24/2018   ADHD (attention deficit hyperactivity disorder) 04/04/2013   Anxiety state 04/04/2013   Past Medical History:  Diagnosis Date   Aneurysm (HCC) 2020   Anxiety    managed per pt   Depression    "haven't really dealt with" in "quite some time"   High blood pressure    slightly elevated   No past surgical history on file. Social History   Tobacco Use   Smoking status: Every Day    Current packs/day: 0.50    Types: Cigarettes   Smokeless tobacco: Never   Tobacco comments:    08/08/2019 update, trying to cut back   Vaping Use   Vaping status: Former  Substance Use Topics   Alcohol use: Yes    Comment: social   Drug use: Yes    Types: Marijuana   Social History   Socioeconomic History   Marital status: Married    Spouse name: Not on file   Number of children: Not on file   Years of education: Not on file   Highest education level: Some college, no degree  Occupational History   Not on file  Tobacco Use   Smoking status: Every Day  Current packs/day: 0.50    Types: Cigarettes   Smokeless tobacco: Never   Tobacco comments:    08/08/2019 update, trying to cut back   Vaping Use   Vaping status: Former  Substance and Sexual Activity   Alcohol use: Yes    Comment: social   Drug use: Yes    Types: Marijuana   Sexual activity: Yes    Birth control/protection: Condom  Other Topics Concern   Not on file  Social History Narrative   Lives at home with his fiance [name]   Right handed   Caffeine: rarely a red bull, not daily. Coffee some, not daily.   Social Determinants of Health   Financial Resource Strain: Not on file  Food Insecurity: Not on file  Transportation Needs:  Not on file  Physical Activity: Not on file  Stress: Not on file  Social Connections: Not on file  Intimate Partner Violence: Not on file   Family Status  Relation Name Status   Mother  Alive   Father  Alive   PGM  (Not Specified)   Other  (Not Specified)   Mat Uncle  (Not Specified)   Neg Hx  (Not Specified)  No partnership data on file   Family History  Problem Relation Age of Onset   Stroke Mother        carotid artery dissection during massage    Aneurysm Paternal Grandmother    Hypertension Other        both sides of family   Lung cancer Other        both sides of family    Cerebral aneurysm Maternal Uncle    Migraines Neg Hx        none that he is aware of   Allergies  Allergen Reactions   Corticosteroids Other (See Comments)    Makes him mean.      Review of Systems  Constitutional:  Negative for chills, fever and malaise/fatigue.  HENT:  Negative for congestion and hearing loss.   Eyes:  Negative for blurred vision and discharge.  Respiratory:  Negative for cough, sputum production and shortness of breath.   Cardiovascular:  Negative for chest pain, palpitations and leg swelling.  Gastrointestinal:  Negative for abdominal pain, blood in stool, constipation, diarrhea, heartburn, nausea and vomiting.  Genitourinary:  Negative for dysuria, frequency, hematuria and urgency.  Musculoskeletal:  Negative for back pain, falls and myalgias.  Skin:  Negative for rash.  Neurological:  Negative for dizziness, sensory change, loss of consciousness, weakness and headaches.  Endo/Heme/Allergies:  Negative for environmental allergies. Does not bruise/bleed easily.  Psychiatric/Behavioral:  Negative for depression and suicidal ideas. The patient is not nervous/anxious and does not have insomnia.       Objective:     BP 122/72 (BP Location: Left Arm, Patient Position: Sitting, Cuff Size: Normal)   Pulse 64   Temp 98.7 F (37.1 C) (Oral)   Resp 18   Ht 5\' 10"  (1.778  m)   Wt 193 lb 9.6 oz (87.8 kg)   SpO2 97%   BMI 27.78 kg/m  BP Readings from Last 3 Encounters:  08/03/23 122/72  05/05/23 (!) 135/90  12/29/22 (!) 147/106   Wt Readings from Last 3 Encounters:  08/03/23 193 lb 9.6 oz (87.8 kg)  05/05/23 193 lb 3.2 oz (87.6 kg)  12/29/22 190 lb (86.2 kg)   SpO2 Readings from Last 3 Encounters:  08/03/23 97%  05/05/23 97%  12/29/22 96%  Physical Exam Vitals and nursing note reviewed.  Constitutional:      General: He is not in acute distress.    Appearance: Normal appearance. He is well-developed.  HENT:     Head: Normocephalic and atraumatic.  Eyes:     General: No scleral icterus.       Right eye: No discharge.        Left eye: No discharge.  Cardiovascular:     Rate and Rhythm: Normal rate and regular rhythm.     Heart sounds: No murmur heard. Pulmonary:     Effort: Pulmonary effort is normal. No respiratory distress.     Breath sounds: Normal breath sounds.  Musculoskeletal:        General: Tenderness present. Normal range of motion.     Cervical back: Normal range of motion and neck supple.     Lumbar back: Spasms and tenderness present. Normal range of motion. Negative right straight leg raise test and negative left straight leg raise test.     Right lower leg: No edema.     Left lower leg: No edema.  Skin:    General: Skin is warm and dry.  Neurological:     Mental Status: He is alert and oriented to person, place, and time.     Motor: No weakness.     Gait: Gait normal.     Deep Tendon Reflexes: Reflexes normal.  Psychiatric:        Mood and Affect: Mood normal.        Behavior: Behavior normal.        Thought Content: Thought content normal.        Judgment: Judgment normal.      No results found for any visits on 08/03/23.  Last CBC Lab Results  Component Value Date   WBC 10.1 12/29/2022   HGB 16.1 12/29/2022   HCT 47.0 12/29/2022   MCV 95.1 12/29/2022   MCH 32.6 12/29/2022   RDW 11.6 12/29/2022    PLT 290 12/29/2022   Last metabolic panel Lab Results  Component Value Date   GLUCOSE 112 (H) 12/29/2022   NA 133 (L) 12/29/2022   K 3.6 12/29/2022   CL 101 12/29/2022   CO2 25 12/29/2022   BUN 13 12/29/2022   CREATININE 1.05 12/29/2022   GFRNONAA >60 12/29/2022   CALCIUM 8.7 (L) 12/29/2022   PROT 7.4 12/29/2022   ALBUMIN 4.2 12/29/2022   BILITOT 0.7 12/29/2022   ALKPHOS 88 12/29/2022   AST 78 (H) 12/29/2022   ALT 133 (H) 12/29/2022   ANIONGAP 7 12/29/2022   Last lipids Lab Results  Component Value Date   CHOL 186 10/14/2020   HDL 30.70 (L) 10/14/2020   LDLCALC 128 (H) 10/14/2020   TRIG 137.0 10/14/2020   CHOLHDL 6 10/14/2020   Last hemoglobin A1c Lab Results  Component Value Date   HGBA1C 5.4 10/14/2020   Last thyroid functions Lab Results  Component Value Date   TSH 3.33 10/14/2020   Last vitamin D No results found for: "25OHVITD2", "25OHVITD3", "VD25OH" Last vitamin B12 and Folate Lab Results  Component Value Date   VITAMINB12 >1526 (H) 10/14/2020   FOLATE 15.5 10/14/2020      The ASCVD Risk score (Arnett DK, et al., 2019) failed to calculate for the following reasons:   The 2019 ASCVD risk score is only valid for ages 64 to 68    Assessment & Plan:   Problem List Items Addressed This Visit   None Visit Diagnoses  Low back pain radiating down leg    -  Primary   Relevant Medications   cyclobenzaprine (FLEXERIL) 10 MG tablet   meloxicam (MOBIC) 15 MG tablet   Other Relevant Orders   DG Lumbar Spine Complete   Acute cough       Relevant Medications   albuterol (VENTOLIN HFA) 108 (90 Base) MCG/ACT inhaler   GAD (generalized anxiety disorder)       Relevant Medications   busPIRone (BUSPAR) 15 MG tablet     Assessment and Plan    Lower Back Pain Acute onset of lower back pain with spasms and radiating discomfort to the extremities. No clear precipitating event. Failed conservative management with chiropractic care, acupuncture, and  over-the-counter analgesics. Physical exam suggests muscular etiology. No signs of neurological compromise. -Order lumbar spine X-ray to assess for any structural abnormalities. -Prescribe muscle relaxant for use at night to aid with sleep and pain control. -Prescribe Meloxicam 15mg  daily for anti-inflammatory effect. -Consider physical therapy if no improvement.  Medication Refills Patient reports running out of Buspar and Albuterol. -Refill Buspar and Albuterol prescriptions at [facility].  Smoking Cessation Patient reports recent unsuccessful attempt to quit smoking. -Encourage continued efforts for smoking cessation.        No follow-ups on file.    Donato Schultz, DO

## 2023-08-09 ENCOUNTER — Ambulatory Visit: Payer: BC Managed Care – PPO | Admitting: Family Medicine

## 2023-08-09 ENCOUNTER — Encounter: Payer: Self-pay | Admitting: Family Medicine

## 2023-08-09 VITALS — BP 122/60 | HR 80 | Temp 97.8°F | Resp 18 | Ht 70.0 in | Wt 193.6 lb

## 2023-08-09 DIAGNOSIS — R7401 Elevation of levels of liver transaminase levels: Secondary | ICD-10-CM

## 2023-08-09 DIAGNOSIS — Z13220 Encounter for screening for lipoid disorders: Secondary | ICD-10-CM | POA: Diagnosis not present

## 2023-08-09 DIAGNOSIS — Z720 Tobacco use: Secondary | ICD-10-CM

## 2023-08-09 DIAGNOSIS — Z131 Encounter for screening for diabetes mellitus: Secondary | ICD-10-CM

## 2023-08-09 DIAGNOSIS — M549 Dorsalgia, unspecified: Secondary | ICD-10-CM

## 2023-08-09 DIAGNOSIS — M545 Low back pain, unspecified: Secondary | ICD-10-CM | POA: Diagnosis not present

## 2023-08-09 DIAGNOSIS — M79606 Pain in leg, unspecified: Secondary | ICD-10-CM

## 2023-08-09 LAB — CBC
HCT: 45.6 % (ref 39.0–52.0)
Hemoglobin: 15.1 g/dL (ref 13.0–17.0)
MCHC: 33 g/dL (ref 30.0–36.0)
MCV: 91.8 fL (ref 78.0–100.0)
Platelets: 266 10*3/uL (ref 150.0–400.0)
RBC: 4.97 Mil/uL (ref 4.22–5.81)
RDW: 13.8 % (ref 11.5–15.5)
WBC: 8.2 10*3/uL (ref 4.0–10.5)

## 2023-08-09 LAB — LIPID PANEL
Cholesterol: 220 mg/dL — ABNORMAL HIGH (ref 0–200)
HDL: 31.8 mg/dL — ABNORMAL LOW (ref >39.00)
LDL Cholesterol: 144 mg/dL — ABNORMAL HIGH (ref 0–99)
NonHDL: 188.28
Total CHOL/HDL Ratio: 7
Triglycerides: 223 mg/dL — ABNORMAL HIGH (ref 0.0–149.0)
VLDL: 44.6 mg/dL — ABNORMAL HIGH (ref 0.0–40.0)

## 2023-08-09 LAB — COMPREHENSIVE METABOLIC PANEL
ALT: 26 U/L (ref 0–53)
AST: 19 U/L (ref 0–37)
Albumin: 4.5 g/dL (ref 3.5–5.2)
Alkaline Phosphatase: 59 U/L (ref 39–117)
BUN: 15 mg/dL (ref 6–23)
CO2: 30 meq/L (ref 19–32)
Calcium: 9.8 mg/dL (ref 8.4–10.5)
Chloride: 101 meq/L (ref 96–112)
Creatinine, Ser: 1.18 mg/dL (ref 0.40–1.50)
GFR: 80.62 mL/min (ref >60.00)
Glucose, Bld: 87 mg/dL (ref 70–99)
Potassium: 4.6 meq/L (ref 3.5–5.1)
Sodium: 137 meq/L (ref 135–145)
Total Bilirubin: 0.4 mg/dL (ref 0.2–1.2)
Total Protein: 6.7 g/dL (ref 6.0–8.3)

## 2023-08-09 LAB — HEMOGLOBIN A1C: Hgb A1c MFr Bld: 5.6 % (ref 4.6–6.5)

## 2023-08-09 LAB — POCT URINALYSIS DIPSTICK
Bilirubin, UA: NEGATIVE
Blood, UA: NEGATIVE
Glucose, UA: NEGATIVE
Ketones, UA: NEGATIVE
Nitrite, UA: NEGATIVE
Protein, UA: NEGATIVE
Spec Grav, UA: <=1.005 — AB (ref 1.010–1.025)
Urobilinogen, UA: 0.2 U/dL
pH, UA: 7.5 (ref 5.0–8.0)

## 2023-08-09 MED ORDER — CYCLOBENZAPRINE HCL 10 MG PO TABS: 10.0000 mg | ORAL_TABLET | Freq: Three times a day (TID) | ORAL | 0 refills | Status: DC | PRN

## 2023-08-09 MED ORDER — NICOTINE 21 MG/24HR TD PT24: 21.0000 mg | MEDICATED_PATCH | Freq: Every day | TRANSDERMAL | 99 refills | Status: DC

## 2023-08-09 MED ORDER — NICOTINE 14 MG/24HR TD PT24: 14.0000 mg | MEDICATED_PATCH | Freq: Every day | TRANSDERMAL | 99 refills | Status: DC

## 2023-08-09 NOTE — Progress Notes (Addendum)
[facility] [address], [address] [place], [place] [address] [phone]  Date:  08/09/2023   Name:  Stephen Castro   DOB:  May 16, 1991   MRN:  [id number]  PCP:  Stephen Cables, MD    Chief Complaint: low back pain (Seen by Dr Stephen Castro on 08/03/23: Lower Back Pain. Ordered a L spine X-ray.Rx: Muscle relaxant, and Meloxicam 15mg . Advised PT if no improvement. Pt says the pain is now more on the L side and wonders if this might be Kidney related. /)   History of Present Illness:  Stephen Castro is a 32 y.o. very pleasant male patient who presents with the following:  Pt seen today for recheck visit -most recent visit with myself was in July-notes from that time: History of hypertension, migraine headache, ADHD, anxiety, alcohol and nitrous oxide abuse At our most recent visit he was using naltrexone daily to reinforce sobriety.  He was doing intensive outpatient program 3 times a week and also seeing a counselor He is doing IOP and is back at work - he notes he has "very little free time" right now between these 2 major activities We started on BuSpar at his last visit-he is down to 15 mg twice a day and feels like this is working well.  His anxiety is under reasonable control He continues to use naltrexone 50 mg daily I would like to stay on this at least for the time being, it is helping to reinforce his sobriety.  He notes he has been sober now for almost 80 days He is using nicotine patch, 21 mg daily.  He would like to continue this for now, but eventually decreased to 14 mg. He was seen by neurology in April 2023 to follow-up history of incidentally discovered cerebral aneurysm And MR angiogram was recommended and completed in December 2023-unremarkable  In July I refilled his naltrexone and increased his dose of BuSpar, got him back on losartan which he had run out of  He was seen by my partner Dr. Zola Castro last week for  back pain-she treated him with Flexeril and meloxicam, refilled his BuSpar  Need to follow-up on transaminitis today.  In February his AST and ALT were significantly elevated.  Patient did not feel able to have blood work in July and asked to delay  Pt noted back pain 3-4 weeks ago He has been doing his accupuncture and chiro treatment as he normally would However he has continued to have pain so he came in 1 week ago as above  He notes insidious onset of pain, no injury that he can think of.  No change in his routine, no new exercise program Meloxicam and flexeril do help and he can sleep with them He does sit at a desk all day He notes back pain in the past, but never lasted this long in the past   The pain does not go down his left leg generally, but he may feel some tingling in both his legs "like restless legs"  No fever, no chills, no cough He is doing well otherwise, notes his BP looks good   They did x-rays 10/1-  LUMBAR SPINE - COMPLETE 4+ VIEW COMPARISON:  None Available. FINDINGS: There are 5 non-rib-bearing lumbar vertebra. The alignment is maintained. Vertebral body heights are normal. There is no listhesis. Disc spaces are preserved. No fracture. Slight L5-S1 facet hypertrophy. No pars defects or focal bone  abnormality. Sacroiliac joints are symmetric and normal. IMPRESSION: Slight L5-S1 facet hypertrophy.  He does not feel like he is getting better- the pain has moved a bit higher up in his left flank No particular urinary sx that he has noted   He does not like to use steroids as they make him feel   Patient Active Problem List   Diagnosis Date Noted   Chronic migraine without aura without status migrainosus, not intractable 08/08/2019   Hypertension 06/30/2019   Brain aneurysm 06/17/2019   Gastroenteritis 04/13/2019   Shoulder injury, left, initial encounter 01/24/2018   ADHD (attention deficit hyperactivity disorder) 04/04/2013   Anxiety state 04/04/2013     Past Medical History:  Diagnosis Date   Aneurysm (HCC) 2020   Anxiety    managed per pt   Depression    "haven't really dealt with" in "quite some time"   High blood pressure    slightly elevated    No past surgical history on file.  Social History   Tobacco Use   Smoking status: Every Day    Current packs/day: 0.50    Types: Cigarettes   Smokeless tobacco: Never   Tobacco comments:    08/08/2019 update, trying to cut back   Vaping Use   Vaping status: Former  Substance Use Topics   Alcohol use: Yes    Comment: social   Drug use: Yes    Types: Marijuana    Family History  Problem Relation Age of Onset   Stroke Mother        carotid artery dissection during massage    Aneurysm Paternal Grandmother    Hypertension Other        both sides of family   Lung cancer Other        both sides of family    Cerebral aneurysm Maternal Uncle    Migraines Neg Hx        none that he is aware of    Allergies  Allergen Reactions   Corticosteroids Other (See Comments)    Makes him mean.    Medication list has been reviewed and updated.  Current Outpatient Medications on File Prior to Visit  Medication Sig Dispense Refill   albuterol (VENTOLIN HFA) 108 (90 Base) MCG/ACT inhaler Inhale 2 puffs into the lungs every 6 (six) hours as needed for wheezing or shortness of breath. 8.5 g 1   busPIRone (BUSPAR) 15 MG tablet Take 1.5 tablets (22.5 mg total) by mouth 2 (two) times daily. Take 1.5 tablets twice daily 270 tablet 2   cyclobenzaprine (FLEXERIL) 10 MG tablet Take 1 tablet (10 mg total) by mouth 3 (three) times daily as needed for muscle spasms. 30 tablet 0   hydrOXYzine (VISTARIL) 25 MG capsule Take 1 capsule (25 mg total) by mouth every 8 (eight) hours as needed. 90 capsule 1   losartan (COZAAR) 50 MG tablet Take 1 tablet (50 mg total) by mouth daily. 90 tablet 3   meloxicam (MOBIC) 15 MG tablet Take 1 tablet (15 mg total) by mouth daily. 30 tablet 2   naltrexone  (DEPADE) 50 MG tablet Take 1 tablet (50 mg total) by mouth daily. 90 tablet 1   nicotine (NICODERM CQ) 14 mg/24hr patch Place 1 patch (14 mg total) onto the skin daily. 28 patch PRN   tiZANidine (ZANAFLEX) 4 MG tablet Take 1 tablet (4 mg total) by mouth every 6 (six) hours as needed for muscle spasms. 30 tablet 0   tretinoin (RETIN-A) 0.05 %  cream Apply topically at bedtime. Use every 2-3 days 45 g 2   No current facility-administered medications on file prior to visit.    Review of Systems:  As per HPI- otherwise negative.   Physical Examination: Vitals:   08/09/23 1116  BP: 122/60  Pulse: 80  Resp: 18  Temp: 97.8 F (36.6 C)  SpO2: 98%   Vitals:   08/09/23 1116  Weight: 193 lb 9.6 oz (87.8 kg)  Height: 5\' 10"  (1.778 m)   Body mass index is 27.78 kg/m. Ideal Body Weight: Weight in (lb) to have BMI = 25: 173.9  GEN: no acute distress.  Looks well, normal weight HEENT: Atraumatic, Normocephalic.  Ears and Nose: No external deformity. CV: RRR, No M/G/R. No JVD. No thrill. No extra heart sounds. PULM: CTA B, no wheezes, crackles, rhonchi. No retractions. No resp. distress. No accessory muscle use. ABD: S, NT, ND, +BS. No rebound. No HSM. EXTR: No c/c/e PSYCH: Normally interactive. Conversant.  Patient indicates the thoracic is sacral junction and left sided thoracic muscles as the site of his discomfort.  There is palpable paraspinous muscle spasm on the left.  No skin changes, no lesion.  Thoracolumbar flexion is mildly decreased due to pain.  Extension is normal.  Bilateral lower extremity strength, sensation, DTR's normal.  Mildly positive straight leg raise on the left only  Results for orders placed or performed in visit on 08/09/23  POCT Urinalysis Dipstick  Result Value Ref Range   Color, UA yellow    Clarity, UA cloudy    Glucose, UA Negative Negative   Bilirubin, UA negative    Ketones, UA negative    Spec Grav, UA <=1.005 (A) 1.010 - 1.025   Blood, UA  negative    pH, UA 7.5 5.0 - 8.0   Protein, UA Negative Negative   Urobilinogen, UA 0.2 0.2 or 1.0 E.U./dL   Nitrite, UA negative    Leukocytes, UA Trace (A) Negative   Appearance cloudy    Odor none     Assessment and Plan: Transaminitis - Plan: Comprehensive metabolic panel  Acute left-sided back pain, unspecified back location - Plan: CBC, POCT Urinalysis Dipstick, Urine Culture  Low back pain radiating down leg - Plan: cyclobenzaprine (FLEXERIL) 10 MG tablet, Ambulatory referral to Physical Therapy  Tobacco use - Plan: nicotine (NICODERM CQ) 14 mg/24hr patch, nicotine (NICODERM CQ) 21 mg/24hr patch  Screening for diabetes mellitus - Plan: Hemoglobin A1c  Screening for hyperlipidemia - Plan: Lipid panel  Patient seen today with concern of left lower back/left flank pain.  Advised that his urine dip appears reassuring, will obtain a culture to make sure there is not an infection.  However I think this is musculoskeletal as opposed to due to his kidneys. We discussed using a course of steroids, patient notes severe side effects and really prefers to avoid steroids unless absolutely necessary In this case we decided to start physical therapy, I refilled Flexeril to use as needed He will continue acupuncture, heat, stretches and exercises, Flexeril meloxicam Refilled nicotine patches Follow-up on transaminitis, other routine lab work Signed Abbe Amsterdam, MD  Received labs as below, message to pt  Results for orders placed or performed in visit on 08/09/23  Comprehensive metabolic panel  Result Value Ref Range   Sodium 137 135 - 145 mEq/L   Potassium 4.6 3.5 - 5.1 mEq/L   Chloride 101 96 - 112 mEq/L   CO2 30 19 - 32 mEq/L   Glucose, Bld 87 70 -  99 mg/dL   BUN 15 6 - 23 mg/dL   Creatinine, Ser 1.61 0.40 - 1.50 mg/dL   Total Bilirubin 0.4 0.2 - 1.2 mg/dL   Alkaline Phosphatase 59 39 - 117 U/L   AST 19 0 - 37 U/L   ALT 26 0 - 53 U/L   Total Protein 6.7 6.0 - 8.3 g/dL    Albumin 4.5 3.5 - 5.2 g/dL   GFR 09.60 >45.40 mL/min   Calcium 9.8 8.4 - 10.5 mg/dL  CBC  Result Value Ref Range   WBC 8.2 4.0 - 10.5 K/uL   RBC 4.97 4.22 - 5.81 Mil/uL   Platelets 266.0 150.0 - 400.0 K/uL   Hemoglobin 15.1 13.0 - 17.0 g/dL   HCT 98.1 19.1 - 47.8 %   MCV 91.8 78.0 - 100.0 fl   MCHC 33.0 30.0 - 36.0 g/dL   RDW 29.5 62.1 - 30.8 %  Hemoglobin A1c  Result Value Ref Range   Hgb A1c MFr Bld 5.6 4.6 - 6.5 %  Lipid panel  Result Value Ref Range   Cholesterol 220 (H) 0 - 200 mg/dL   Triglycerides 657.8 (H) 0.0 - 149.0 mg/dL   HDL 46.96 (L) >29.52 mg/dL   VLDL 84.1 (H) 0.0 - 32.4 mg/dL   LDL Cholesterol 401 (H) 0 - 99 mg/dL   Total CHOL/HDL Ratio 7    NonHDL 188.28   POCT Urinalysis Dipstick  Result Value Ref Range   Color, UA yellow    Clarity, UA cloudy    Glucose, UA Negative Negative   Bilirubin, UA negative    Ketones, UA negative    Spec Grav, UA <=1.005 (A) 1.010 - 1.025   Blood, UA negative    pH, UA 7.5 5.0 - 8.0   Protein, UA Negative Negative   Urobilinogen, UA 0.2 0.2 or 1.0 E.U./dL   Nitrite, UA negative    Leukocytes, UA Trace (A) Negative   Appearance cloudy    Odor none

## 2023-08-09 NOTE — Patient Instructions (Signed)
It was good to see you today-I am sorry your back has been bothering you so much!   I put in a referral to physical therapy.  I will be in touch with your labs as soon as possible  Please let me know if your back is not feeling better in the next week or so.  Let me know sooner if getting worse!   Recommend flu shot and covid booster this fall

## 2023-08-10 LAB — URINE CULTURE
MICRO NUMBER:: 15560861
Result:: NO GROWTH
SPECIMEN QUALITY:: ADEQUATE

## 2023-08-24 ENCOUNTER — Ambulatory Visit: Payer: BC Managed Care – PPO | Attending: Family Medicine

## 2023-08-24 ENCOUNTER — Other Ambulatory Visit: Payer: Self-pay

## 2023-08-24 DIAGNOSIS — M62830 Muscle spasm of back: Secondary | ICD-10-CM | POA: Diagnosis not present

## 2023-08-24 DIAGNOSIS — M5459 Other low back pain: Secondary | ICD-10-CM | POA: Diagnosis not present

## 2023-08-24 DIAGNOSIS — R293 Abnormal posture: Secondary | ICD-10-CM | POA: Insufficient documentation

## 2023-08-24 DIAGNOSIS — M79606 Pain in leg, unspecified: Secondary | ICD-10-CM | POA: Insufficient documentation

## 2023-08-24 DIAGNOSIS — M545 Low back pain, unspecified: Secondary | ICD-10-CM | POA: Insufficient documentation

## 2023-08-24 NOTE — Therapy (Signed)
OUTPATIENT PHYSICAL THERAPY THORACOLUMBAR EVALUATION   Patient Name: Stephen Castro MRN: [id number] DOB:01/20/1991, 32 y.o., male Today's Date: 08/24/2023  END OF SESSION:  PT End of Session - 08/24/23 1408     Visit Number 1    Date for PT Re-Evaluation 10/19/23    Progress Note Due on Visit 10    PT Start Time 1408    PT Stop Time 1445    PT Time Calculation (min) 37 min    Activity Tolerance Patient tolerated treatment well    Behavior During Therapy [facility] for tasks assessed/performed             Past Medical History:  Diagnosis Date   Aneurysm (HCC) 2020   Anxiety    managed per pt   Depression    "haven't really dealt with" in "quite some time"   High blood pressure    slightly elevated   History reviewed. No pertinent surgical history. Patient Active Problem List   Diagnosis Date Noted   Chronic migraine without aura without status migrainosus, not intractable 08/08/2019   Hypertension 06/30/2019   Brain aneurysm 06/17/2019   Gastroenteritis 04/13/2019   Shoulder injury, left, initial encounter 01/24/2018   ADHD (attention deficit hyperactivity disorder) 04/04/2013   Anxiety state 04/04/2013    PCP: Riley Churches  REFERRING PROVIDER: same   REFERRING DIAG: L lower back/ flank pain  Rationale for Evaluation and Treatment: Rehabilitation  THERAPY DIAG:  Other low back pain  Muscle spasm of back  Abnormal posture  ONSET DATE: 2 months, 06/22/23  SUBJECTIVE:                                                                                                                                                                                           SUBJECTIVE STATEMENT: Pain worst at night, I lie down, go to sleep, then it wakes me up, then turn on my side, the the other side, and finally it spasms so much.  If I eventually lie down on my stomach with pillows under my abdomen then it will calm down some.  The muscle relaxers help. Also my feet and the  tips of my fingers will tingle at night. I go to chiropractor once a week and to acupuncture once a week, gives me temporary relief  PERTINENT HISTORY:  Pain L center lower back which then began radiating out to the side  PAIN:  Are you having pain? Yes: NPRS scale: 0 to 5/10 Pain location: around thoracolumbar junction Pain description: deep pain which progresses to muscle spasm Aggravating factors: lying down at night Relieving factors: muscle relaxers  PRECAUTIONS: None  RED FLAGS: None   WEIGHT BEARING RESTRICTIONS: No  FALLS:  Has patient fallen in last 6 months? 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  DIAGNOSTIC FINDINGS:  Mild arthritic changes L5 S1  PATIENT SURVEYS:  Modified Oswestry 9/50   SCREENING FOR RED FLAGS: Bowel or bladder incontinence: No Spinal tumors: No Cauda equina syndrome: No Compression fracture: No Abdominal aneurysm: No  COGNITION: Overall cognitive status: Within functional limits for tasks assessed     SENSATION: WFL  MUSCLE LENGTH: Hamstrings: Right - 35 deg; Left -20 deg Thomas test: Right wnl deg; Left -15 deg  POSTURE: rounded shoulders and weight shift right, elevated R iliac crest Protruding T 11, then depressed T 12 spinous process and tender over this area  PALPATION: Some tenderness B PSOAS, B iliacus, R T 12 transverse process  LUMBAR ROM: all lumbar ROM wnl , in fact hypermobile   LOWER EXTREMITY ROM:   all wfl   LOWER EXTREMITY MMT:  all wnl  LUMBAR SPECIAL TESTS:  Straight leg raise test: Negative, Single leg stance test: Negative, SI Compression/distraction test:  Positive, Gaenslen's test: Positive, and Thomas test: Positive  GAIT: Distance walked: in clinic, no deficits noted  TODAY'S TREATMENT:                                                                                                                              DATE: 08/24/23:  Muscle energy: supine for alt resisted knee to chest, to align pelvis Instructed in L hip flexor stretch in prone off edge of bed, with press ups and R hamstring stretch Attempted thoracic roll stretch, but started to have muscle spasm. Prayer stretch, tractioning hands on end of table. Therapist provided sacral distraction with relief reported by pt.    PATIENT EDUCATION:  Education details: POC, goals Person educated: Patient Education method: Explanation, Demonstration, and Tactile cues Education comprehension: verbalized understanding, returned demonstration, and verbal cues required  HOME EXERCISE PROGRAM: See above  ASSESSMENT:  CLINICAL IMPRESSION: Patient is a 32 y.o. male, who was evaluated today due to acute onset of mid back pain, which is more pronounced  with attempting to sleep.  He is already attending chiropractic care once a week and acupuncture once a week for this problem.  He is very mobile, excessive ROM lumbar spine all directions noted.  Has restricted R hamstring and L hip flexor flexibility, noted some asymmetry with pelvic alignment. PA pressure over mid and lower thoracic region reproduced some of his Sx.  So today instructed him in stretches to align pelvis as well as to distract/ traction thoracic spine.  He should benefit from skilled  PT, needs further assessment, would recommend TPDN along areas in which he gets spasms. Due to his hypermobility suspect some spinal instability so may need additional assessment specifically in that area in future visits. OBJECTIVE IMPAIRMENTS: impaired perceived functional ability, improper body mechanics, and pain.   ACTIVITY LIMITATIONS:  sleeping  PARTICIPATION LIMITATIONS: yard work  PERSONAL FACTORS: Behavior pattern, Fitness, Time since onset of injury/illness/exacerbation, and 1 comorbidity: migraines, HTN,, substance abuse  are also affecting patient's functional outcome.   REHAB POTENTIAL: Fair    CLINICAL DECISION MAKING: Evolving/moderate complexity  EVALUATION COMPLEXITY: Moderate   GOALS: Goals reviewed with patient? Yes  SHORT TERM GOALS: Target date: 2 weeks 09/07/23  I HEP Baseline: Goal status: INITIAL   LONG TERM GOALS: Target date: 10/19/23  Modified Oswestry improve from 18%  to 8% Baseline:  Goal status: INITIAL  2.  Patient will report no sleep interruption due to back pain Baseline: daily sleep interruption Goal status: INITIAL  3.  Consistent symmetrical hamstring and hip flexor length B  Baseline: -30 R hamstrings, -15 L hip flexors Goal status: INITIAL  PLAN:  PT FREQUENCY: 1x/week  PT DURATION: 8 weeks  PLANNED INTERVENTIONS: 97110-Therapeutic exercises, 97530- Therapeutic activity, [id number]- Neuromuscular re-education, 97535- Self Care, and 53664- Manual therapy.  PLAN FOR NEXT SESSION: utilize TPDN thoracic paraspinals, assess further for stability thoracolumbar region, reassess pelvic alignment   Emari Demmer L Mishayla Sliwinski, PT, DPT, OCS 08/24/2023, 5:47 PM

## 2023-09-09 ENCOUNTER — Ambulatory Visit: Payer: BC Managed Care – PPO | Attending: Family Medicine | Admitting: Physical Therapy

## 2023-09-09 ENCOUNTER — Encounter: Payer: Self-pay | Admitting: Physical Therapy

## 2023-09-09 DIAGNOSIS — M62830 Muscle spasm of back: Secondary | ICD-10-CM

## 2023-09-09 DIAGNOSIS — R293 Abnormal posture: Secondary | ICD-10-CM

## 2023-09-09 DIAGNOSIS — M5459 Other low back pain: Secondary | ICD-10-CM | POA: Diagnosis not present

## 2023-09-09 NOTE — Patient Instructions (Signed)

## 2023-09-09 NOTE — Therapy (Signed)
OUTPATIENT PHYSICAL THERAPY TREATMENT  Patient Name: Stephen Castro MRN: [id number] DOB:1990/11/04, 32 y.o., male Today's Date: 09/09/2023  END OF SESSION:  PT End of Session - 09/09/23 1152     Visit Number 2    Date for PT Re-Evaluation 10/19/23    Progress Note Due on Visit 10    PT Start Time 1150    PT Stop Time 1232    PT Time Calculation (min) 42 min    Activity Tolerance Patient tolerated treatment well    Behavior During Therapy [facility] for tasks assessed/performed             Past Medical History:  Diagnosis Date   Aneurysm (HCC) 2020   Anxiety    managed per pt   Depression    "haven't really dealt with" in "quite some time"   High blood pressure    slightly elevated   History reviewed. No pertinent surgical history. Patient Active Problem List   Diagnosis Date Noted   Chronic migraine without aura without status migrainosus, not intractable 08/08/2019   Hypertension 06/30/2019   Brain aneurysm 06/17/2019   Gastroenteritis 04/13/2019   Shoulder injury, left, initial encounter 01/24/2018   ADHD (attention deficit hyperactivity disorder) 04/04/2013   Anxiety state 04/04/2013    PCP: Riley Churches  REFERRING PROVIDER: same   REFERRING DIAG: L lower back/ flank pain  Rationale for Evaluation and Treatment: Rehabilitation  THERAPY DIAG:  Other low back pain  Muscle spasm of back  Abnormal posture  ONSET DATE: 2 months, 06/22/23  SUBJECTIVE:                                                                                                                                                                                           SUBJECTIVE STATEMENT: Was doing a little better, but last night it woke me up.  Today very stiff, like it needs to pop, and a little bit of electricity.   PERTINENT HISTORY:  Pain L center lower back which then began radiating out to the side  PAIN:  Are you having pain? Yes: NPRS scale: 5/10 Pain location: around  thoracolumbar junction Pain description: deep pain which progresses to muscle spasm Aggravating factors: lying down at night Relieving factors: muscle relaxers  PRECAUTIONS: None  RED FLAGS: None   WEIGHT BEARING RESTRICTIONS: No  FALLS:  Has patient fallen in last 6 months? 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  DIAGNOSTIC FINDINGS:  Mild arthritic changes L5 S1  PATIENT SURVEYS:  Modified Oswestry 9/50   SCREENING FOR RED FLAGS: Bowel or bladder incontinence: No Spinal tumors: No Cauda equina syndrome: No Compression fracture: No Abdominal aneurysm: No  COGNITION: Overall cognitive status: Within functional limits for tasks assessed     SENSATION: WFL  MUSCLE LENGTH: Hamstrings: Right - 35 deg; Left -20 deg Thomas test: Right wnl deg; Left -15 deg  POSTURE: rounded shoulders and weight shift right, elevated R iliac crest Protruding T 11, then depressed T 12 spinous process and tender over this area  PALPATION: Some tenderness B PSOAS, B iliacus, R T 12 transverse process  LUMBAR ROM: all lumbar ROM wnl , in fact hypermobile   LOWER EXTREMITY ROM:   all wfl   LOWER EXTREMITY MMT:  all wnl  LUMBAR SPECIAL TESTS:  Straight leg raise test: Negative, Single leg stance test: Negative, SI Compression/distraction test: Positive, Gaenslen's test: Positive, and Thomas test: Positive  GAIT: Distance walked: in clinic, no deficits noted  TODAY'S TREATMENT:                                                                                                                              DATE:   09/09/23 Therapeutic Exercise:  to improve strength and mobility.  Demo, verbal and tactile cues throughout for technique. Nustep L5 x 6 min Prone leg extensions 3 x 10 each side Supine PPT x 10 Bridges with TrA contraction x 10 S/L open book for lumbothoracic mobilization x  10 each side MET with resisted isometric contraction R hip extension, L hip flexion 10 x 5 sec hold - negative supine to long sit retest after Manual Therapy: to decrease muscle spasm and pain and improve mobility STM/TPR to thoracolumbar erector spinae, L UPA mobs T12-L2 grade 3-4, skilled palpation and monitoring during dry needling. Trigger Point Dry-Needling  Treatment instructions: Expect mild to moderate muscle soreness. S/S of pneumothorax if dry needled over a lung field, and to seek immediate medical attention should they occur. Patient verbalized understanding of these instructions and education. Patient Consent Given: Yes Education handout provided: Previously provided Muscles treated: L T12-L2 multifidi Electrical stimulation performed: No Parameters: N/A Treatment response/outcome: Twitch Response Elicited and Palpable Increase in Muscle Length   08/24/23:  Muscle energy: supine for alt resisted knee to chest, to align pelvis Instructed in L hip flexor stretch in prone off edge of bed, with press ups and R hamstring stretch Attempted thoracic roll stretch, but started to have muscle spasm. Prayer stretch, tractioning hands on end of table. Therapist provided sacral distraction with relief reported by pt.    PATIENT EDUCATION:  Education details: TrDN, HEP update Person educated: Patient Education method: Explanation, Demonstration, Tactile cues, and Handouts Education comprehension: verbalized understanding, returned demonstration, and verbal cues required  HOME EXERCISE PROGRAM: See aboveAccess Code: [email or web address] URL: [email or web address] Date: 09/09/2023 Prepared by: Harrie Foreman  Exercises - Supine  Posterior Pelvic Tilt  - 1 x daily - 7 x weekly - 1 sets - 10 reps - Supine Bridge  - 1 x daily - 7 x weekly - 2-3 sets - 10 reps - Prone Hip Extension with Plantarflexion  - 1 x daily - 7 x weekly - 3 sets - 10 reps - Sidelying Open Book Thoracic Lumbar Rotation and Extension  - 1 x daily - 7 x weekly - 1 sets - 10 reps  ASSESSMENT:  CLINICAL IMPRESSION: Ned Grace reports continued pain at thoracolumbar junction waking him at night.  Noted positive supine to long sit test, corrected with MET.  Started stabilization exercise program, as well as gentle mobilization exercise for thoracic spine as complaints of a lot of stiffness especially when lays down at night, tolerated exercises well.  After explanation of DN rational, procedures, outcomes and potential side effects, patient verbalized consent to DN treatment in conjunction with manual STM/DTM and TPR to reduce ttp/muscle tension. Muscles treated as indicated above. DN produced normal response with good twitches elicited resulting in palpable reduction in pain/ttp and muscle tension, with patient noting less pain upon initiation of movement following DN. Pt educated to expect mild to moderate muscle soreness for up to 24-48 hrs and instructed to continue prescribed home exercise program and current activity level with pt verbalizing understanding of theses instructions.  Ned Grace continues to demonstrate potential for improvement and would benefit from continued skilled therapy to address impairments.     OBJECTIVE IMPAIRMENTS: impaired perceived functional ability, improper body mechanics, and pain.   ACTIVITY LIMITATIONS: sleeping  PARTICIPATION LIMITATIONS: yard work  PERSONAL FACTORS: Behavior pattern, Fitness, Time since onset of injury/illness/exacerbation, and 1 comorbidity: migraines, HTN,, substance abuse  are also affecting patient's functional outcome.   REHAB POTENTIAL: Fair    CLINICAL DECISION MAKING:  Evolving/moderate complexity  EVALUATION COMPLEXITY: Moderate   GOALS: Goals reviewed with patient? Yes  SHORT TERM GOALS: Target date: 2 weeks 09/07/23  I HEP Baseline: Goal status: MET 09/09/23 Performing initial HEP, no change in symptoms, advanced today   LONG TERM GOALS: Target date: 10/19/23  Modified Oswestry improve from 18%  to 8% Baseline:  Goal status: IN PROGRESS  2.  Patient will report no sleep interruption due to back pain Baseline: daily sleep interruption Goal status: IN PROGRESS  3.  Consistent symmetrical hamstring and hip flexor length B  Baseline: -30 R hamstrings, -15 L hip flexors Goal status: IN PROGRESS  PLAN:  PT FREQUENCY: 1x/week  PT DURATION: 8 weeks  PLANNED INTERVENTIONS: 97110-Therapeutic exercises, 97530- Therapeutic activity, [id number]- Neuromuscular re-education, 97535- Self Care, and 86578- Manual therapy.  PLAN FOR NEXT SESSION: assess further for stability thoracolumbar region, response to TrDN   Jena Gauss, PT, DPT 09/09/2023, 12:44 PM

## 2023-09-14 ENCOUNTER — Ambulatory Visit: Payer: BC Managed Care – PPO

## 2023-09-14 DIAGNOSIS — M62830 Muscle spasm of back: Secondary | ICD-10-CM

## 2023-09-14 DIAGNOSIS — M5459 Other low back pain: Secondary | ICD-10-CM

## 2023-09-14 DIAGNOSIS — R293 Abnormal posture: Secondary | ICD-10-CM

## 2023-09-14 NOTE — Therapy (Signed)
OUTPATIENT PHYSICAL THERAPY TREATMENT  Patient Name: Stephen Castro MRN: [id number] DOB:1991/04/22, 32 y.o., male Today's Date: 09/15/2023  END OF SESSION:  PT End of Session - 09/14/23 1521     Visit Number 3    Date for PT Re-Evaluation 10/19/23    Progress Note Due on Visit 10    PT Start Time 1407    PT Stop Time 1441    PT Time Calculation (min) 34 min    Activity Tolerance Patient tolerated treatment well    Behavior During Therapy [facility] for tasks assessed/performed              Past Medical History:  Diagnosis Date   Aneurysm (HCC) 2020   Anxiety    managed per pt   Depression    "haven't really dealt with" in "quite some time"   High blood pressure    slightly elevated   History reviewed. No pertinent surgical history. Patient Active Problem List   Diagnosis Date Noted   Chronic migraine without aura without status migrainosus, not intractable 08/08/2019   Hypertension 06/30/2019   Brain aneurysm 06/17/2019   Gastroenteritis 04/13/2019   Shoulder injury, left, initial encounter 01/24/2018   ADHD (attention deficit hyperactivity disorder) 04/04/2013   Anxiety state 04/04/2013    PCP: Riley Churches  REFERRING PROVIDER: same   REFERRING DIAG: L lower back/ flank pain  Rationale for Evaluation and Treatment: Rehabilitation  THERAPY DIAG:  Muscle spasm of back  Other low back pain  Abnormal posture  ONSET DATE: 2 months, 06/22/23  SUBJECTIVE:                                                                                                                                                                                           SUBJECTIVE STATEMENT: Pretty stiff today, feel like my back needs to pop, difficulty sleeping last night PERTINENT HISTORY:  Pain L center lower back which then began radiating out to the side  PAIN:  Are you having pain? Yes: NPRS scale: 5/10 Pain location: around thoracolumbar junction Pain description: deep pain  which progresses to muscle spasm Aggravating factors: lying down at night Relieving factors: muscle relaxers  PRECAUTIONS: None  RED FLAGS: None   WEIGHT BEARING RESTRICTIONS: No  FALLS:  Has patient fallen in last 6 months? 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  DIAGNOSTIC FINDINGS:  Mild arthritic changes L5 S1  PATIENT SURVEYS:  Modified Oswestry 9/50   SCREENING FOR RED FLAGS: Bowel or bladder incontinence: No Spinal tumors: No Cauda equina syndrome: No Compression fracture: No Abdominal aneurysm: No  COGNITION: Overall cognitive status: Within functional limits for tasks assessed     SENSATION: WFL  MUSCLE LENGTH: Hamstrings: Right - 35 deg; Left -20 deg Thomas test: Right wnl deg; Left -15 deg  POSTURE: rounded shoulders and weight shift right, elevated R iliac crest Protruding T 11, then depressed T 12 spinous process and tender over this area  PALPATION: Some tenderness B PSOAS, B iliacus, R T 12 transverse process  LUMBAR ROM: all lumbar ROM wnl , in fact hypermobile   LOWER EXTREMITY ROM:   all wfl   LOWER EXTREMITY MMT:  all wnl  LUMBAR SPECIAL TESTS:  Straight leg raise test: Negative, Single leg stance test: Negative, SI Compression/distraction test: Positive, Gaenslen's test: Positive, and Thomas test: Positive  GAIT: Distance walked: in clinic, no deficits noted  TODAY'S TREATMENT:                                                                                                                              DATE:  09/15/23: Therapeutic exercise:  Utilized prone instability test for lumbar spine +  for relief of pain at T 11,12,L2,L3, L4 . Consistent click and no relief of pain with PA pressure L1 when combined with L hip extension.   Attempted various stabilization ex to improve upper lumbar instability, pt with difficulty maintaining neutral spine position in plank position, particularly when combined with L hip ext so instructed to try planks with small, controlled alt hip ext to improve his stability  Also inst in self lumbar traction by hanging legs off table in prone to replicate off bed, pt reports he already performs some version of this exercise. 09/09/23 Therapeutic Exercise: to improve strength and mobility.  Demo, verbal and tactile cues throughout for technique. Nustep L5 x 6 min Prone leg extensions 3 x 10 each side Supine PPT x 10 Bridges with TrA contraction x 10 S/L open book for lumbothoracic mobilization x  10 each side MET with resisted isometric contraction R hip extension, L hip flexion 10 x 5 sec hold - negative supine to long sit retest after Manual Therapy: to decrease muscle spasm and pain and improve mobility STM/TPR to thoracolumbar erector spinae, L UPA mobs T12-L2 grade 3-4, skilled palpation and monitoring during dry needling. Trigger Point Dry-Needling  Treatment instructions: Expect mild to moderate muscle soreness. S/S of pneumothorax if dry needled over a lung field, and to seek immediate medical attention should they occur. Patient verbalized understanding of these instructions and education. Patient Consent Given: Yes Education handout provided: Previously provided Muscles treated: L T12-L2 multifidi Electrical stimulation performed: No Parameters: N/A Treatment response/outcome: Twitch Response Elicited and Palpable Increase in Muscle Length  08/24/23:  Muscle energy: supine for alt resisted knee to chest, to align pelvis Instructed in L hip flexor stretch in prone off edge of bed, with press ups and R hamstring stretch Attempted thoracic roll  stretch, but started to have muscle spasm. Prayer stretch, tractioning hands on end of table. Therapist provided sacral distraction with relief reported by pt.    PATIENT EDUCATION:  Education details: TrDN, HEP update Person educated: Patient Education method: Explanation, Demonstration, Tactile cues, and Handouts Education comprehension: verbalized understanding, returned demonstration, and verbal cues required  HOME EXERCISE PROGRAM: See aboveAccess Code: [email or web address] URL: [email or web address] Date: 09/09/2023 Prepared by: Harrie Foreman  Exercises - Supine Posterior Pelvic Tilt  - 1 x daily - 7 x weekly - 1 sets - 10 reps - Supine Bridge  - 1 x daily - 7 x weekly - 2-3 sets - 10 reps - Prone Hip Extension with Plantarflexion  - 1 x daily - 7 x weekly - 3 sets - 10 reps - Sidelying Open Book Thoracic Lumbar Rotation and Extension  - 1 x daily - 7 x weekly - 1 sets - 10 reps  ASSESSMENT:  CLINICAL IMPRESSION: Ned Grace reports continued pain at thoracolumbar junction waking him at night.  Ambiguous regarding whether TPDN was effective, some relief immediately afterward, then same Sx again of feeling like needs to "POP".  He did demonstrate positive instability signs with testing today, particularly at L2, so attempted to educate, instruct him in appropriate strengthening for core, with small controlled trunk movement. Ned Grace continues to demonstrate potential for improvement and would benefit from continued skilled therapy to address impairments.     OBJECTIVE IMPAIRMENTS: impaired perceived functional ability, improper body mechanics, and pain.   ACTIVITY LIMITATIONS: sleeping  PARTICIPATION LIMITATIONS: yard work  PERSONAL FACTORS: Behavior pattern, Fitness, Time since onset of injury/illness/exacerbation, and 1 comorbidity: migraines, HTN,, substance abuse  are also affecting patient's functional outcome.   REHAB POTENTIAL: Fair    CLINICAL  DECISION MAKING: Evolving/moderate complexity  EVALUATION COMPLEXITY: Moderate   GOALS: Goals reviewed with patient? Yes  SHORT TERM GOALS: Target date: 2 weeks 09/07/23  I HEP Baseline: Goal status: MET 09/09/23 Performing initial HEP, no change in symptoms, advanced today   LONG TERM GOALS: Target date: 10/19/23  Modified Oswestry improve from 18%  to 8% Baseline:  Goal status: IN PROGRESS  2.  Patient will report no sleep interruption due to back pain Baseline: daily sleep interruption Goal status: IN PROGRESS  3.  Consistent symmetrical hamstring and hip flexor length B  Baseline: -30 R hamstrings, -15 L hip flexors Goal status: IN PROGRESS  PLAN:  PT FREQUENCY: 1x/week  PT DURATION: 8 weeks  PLANNED INTERVENTIONS: 97110-Therapeutic exercises, 97530- Therapeutic activity, [id number]- Neuromuscular re-education, 97535- Self Care, and 27253- Manual therapy.  PLAN FOR NEXT SESSION: assess further for stability thoracolumbar region, revisit TrDN, assess for psoas/iliacus tightness particularly on L   [facility] Otillia Cordone, PT, DPT, OCS 09/15/2023, 12:04 PM

## 2023-09-15 ENCOUNTER — Other Ambulatory Visit: Payer: Self-pay

## 2023-09-20 ENCOUNTER — Other Ambulatory Visit: Payer: Self-pay | Admitting: Family Medicine

## 2023-09-20 DIAGNOSIS — M79606 Pain in leg, unspecified: Secondary | ICD-10-CM

## 2023-09-20 DIAGNOSIS — M545 Low back pain, unspecified: Secondary | ICD-10-CM

## 2023-09-21 ENCOUNTER — Telehealth: Payer: Self-pay | Admitting: Family Medicine

## 2023-09-21 ENCOUNTER — Other Ambulatory Visit: Payer: Self-pay

## 2023-09-21 ENCOUNTER — Encounter: Payer: Self-pay | Admitting: Physical Therapy

## 2023-09-21 ENCOUNTER — Ambulatory Visit: Payer: BC Managed Care – PPO | Admitting: Physical Therapy

## 2023-09-21 DIAGNOSIS — M79606 Pain in leg, unspecified: Secondary | ICD-10-CM

## 2023-09-21 MED ORDER — MELOXICAM 15 MG PO TABS: 15.0000 mg | ORAL_TABLET | Freq: Every day | ORAL | 2 refills | Status: DC

## 2023-09-21 NOTE — Telephone Encounter (Signed)
Prescription Request  09/21/2023  Is this a "Controlled Substance" medicine? No  LOV: 08/09/2023  What is the name of the medication or equipment?   meloxicam (MOBIC) 15 MG tablet  Have you contacted your pharmacy to request a refill? Yes   Which pharmacy would you like this sent to?  [id number] - [place], [place] - [address] AT [facility] [address] [place] [address] Phone: [phone] Fax: [phone]    Patient notified that their request is being sent to the clinical staff for review and that they should receive a response within 2 business days.   Please advise at [facility] [phone]

## 2023-09-21 NOTE — Telephone Encounter (Signed)
Refill sent.

## 2023-10-18 ENCOUNTER — Telehealth: Payer: Self-pay | Admitting: Family Medicine

## 2023-10-18 DIAGNOSIS — M79606 Pain in leg, unspecified: Secondary | ICD-10-CM

## 2023-10-18 MED ORDER — CYCLOBENZAPRINE HCL 10 MG PO TABS: ORAL_TABLET | ORAL | 0 refills | Status: DC

## 2023-10-18 MED ORDER — MELOXICAM 15 MG PO TABS: 15.0000 mg | ORAL_TABLET | Freq: Every day | ORAL | 2 refills | Status: DC

## 2023-10-18 NOTE — Addendum Note (Signed)
Addended by: Abbe Amsterdam C on: 10/18/2023 03:07 PM   Modules accepted: Orders

## 2023-10-18 NOTE — Telephone Encounter (Signed)
Pt would like refills on Meloxicam and Cyclobenzaprine. Okay to fill? (Not controlled, but would like to make sure its okay to refill).

## 2023-10-18 NOTE — Telephone Encounter (Signed)
Pt leaving for [place] on Friday and will be gone for 8 days so he needs it before he leaves

## 2023-10-18 NOTE — Telephone Encounter (Signed)
Prescription Request  10/18/2023  Is this a "Controlled Substance" medicine? Yes  LOV: 08/09/2023  What is the name of the medication or equipment? Meloxicam  cyclobenzaprine  Have you contacted your pharmacy to request a refill? No   Which pharmacy would you like this sent to?  [id number] - [place], [place] - [address] AT [facility] [address] [place] [address] Phone: [phone] Fax: [phone]    Patient notified that their request is being sent to the clinical staff for review and that they should receive a response within 2 business days.   Please advise at Mobile [phone] (mobile)

## 2023-11-06 NOTE — Progress Notes (Signed)
 [facility] [address] [place], [place] [address] [phone]  Date:  11/08/2023   Name:  Stephen Castro   DOB:  1991-03-23   MRN:  [id number]  PCP:  Watt Harlene BROCKS, MD    Chief Complaint: Back Pain (Concerns/ questions: pt would like to review meds and possibly remove some/Flu shot today: declines /Tdap: none in ncir/HIV/Hep C screen due)   History of Present Illness:  Stephen Castro is a 33 y.o. very pleasant male patient who presents with the following:  Patient seen today for follow-up of back pain Most recent visit with myself was in October History of hypertension, migraine headache, ADHD, anxiety, alcohol  and nitrous oxide abuse   In October he had concern of back pain for about a month-this me his back pain has been present for more than 3 months now We started physical therapy and were using Flexeril  as needed It looks like he has been to a few PT sessions but then stopped going-his therapist sent him a message about a missed appointment in November (she sent a MyChart message which has not been read.  She called and his voicemail was full). He notes PT did not seem to help him very much so he stopped going His back still hurts-not any better Sitting and laying down of the most painful, standing is most comfortable When hie is laying down he feels like he is constantly adjusting to a comfortable position   He had to fly to [place]  over the holidays and the flights were hard -he was very uncomfortable on the plane  Pain does not generally radiate to his legs, he is not aware of any injury.  We did do lumbar x-rays in October which showed some minor changes. At this point he is interested in doing an MRI which we can set up-no metallic foreign body  He is using buspar  twice a day- he notes he still has some anxiety sx, he does not feel that the BuSpar  is necessarily working When his BP is up or he is under  more stress his anxiety is worse He is trying to exercise and read more to manage his sx -this does help, but he also wonders about changing medication  He is hoping to go back to school soon -he has not signed up yet but may return for the fall semester.  If he goes back to school he might like to restart a stimulant for ADHD treatment.  He notes he never had a problem with abusing stimulants, he has also been sober from alcohol  for about 9 months-and he has nearly quit smoking.  He would like to cut back on his nicotine  patches from the 14 mg to the 7 mg strength  He ran out of his BP meds a few weeks ago -blood pressure is mildly elevated but not much.  He would like to try a lower dose of losartan  which is reasonable   Patient Active Problem List   Diagnosis Date Noted   Chronic migraine without aura without status migrainosus, not intractable 08/08/2019   Hypertension 06/30/2019   Brain aneurysm 06/17/2019   Gastroenteritis 04/13/2019   Shoulder injury, left, initial encounter 01/24/2018   ADHD (attention deficit hyperactivity disorder) 04/04/2013   Anxiety state 04/04/2013    Past Medical History:  Diagnosis Date   Aneurysm (HCC) 2020   Anxiety    managed per pt   Depression  haven't really dealt with in quite some time   High blood pressure    slightly elevated    No past surgical history on file.  Social History   Tobacco Use   Smoking status: Every Day    Current packs/day: 0.50    Types: Cigarettes   Smokeless tobacco: Never   Tobacco comments:    08/08/2019 update, trying to cut back   Vaping Use   Vaping status: Former  Substance Use Topics   Alcohol  use: Yes    Comment: social   Drug use: Yes    Types: Marijuana    Family History  Problem Relation Age of Onset   Stroke Mother        carotid artery dissection during massage    Aneurysm Paternal Grandmother    Hypertension Other        both sides of family   Lung cancer Other        both sides  of family    Cerebral aneurysm Maternal Uncle    Migraines Neg Hx        none that he is aware of    Allergies  Allergen Reactions   Corticosteroids Other (See Comments)    Makes him mean.    Medication list has been reviewed and updated.  Current Outpatient Medications on File Prior to Visit  Medication Sig Dispense Refill   albuterol  (VENTOLIN  HFA) 108 (90 Base) MCG/ACT inhaler Inhale 2 puffs into the lungs every 6 (six) hours as needed for wheezing or shortness of breath. 8.5 g 1   busPIRone  (BUSPAR ) 15 MG tablet Take 1.5 tablets (22.5 mg total) by mouth 2 (two) times daily. Take 1.5 tablets twice daily 270 tablet 2   cyclobenzaprine  (FLEXERIL ) 10 MG tablet TAKE 1 TABLET(10 MG) BY MOUTH THREE TIMES DAILY AS NEEDED FOR MUSCLE SPASMS 30 tablet 0   hydrOXYzine  (VISTARIL ) 25 MG capsule Take 1 capsule (25 mg total) by mouth every 8 (eight) hours as needed. 90 capsule 1   losartan  (COZAAR ) 50 MG tablet Take 1 tablet (50 mg total) by mouth daily. 90 tablet 3   meloxicam  (MOBIC ) 15 MG tablet Take 1 tablet (15 mg total) by mouth daily. 30 tablet 2   nicotine  (NICODERM CQ ) 14 mg/24hr patch Place 1 patch (14 mg total) onto the skin daily. 28 patch PRN   tretinoin  (RETIN-A ) 0.05 % cream Apply topically at bedtime. Use every 2-3 days 45 g 2   No current facility-administered medications on file prior to visit.    Review of Systems:  As per HPI- otherwise negative.   Physical Examination: Vitals:   11/08/23 1429  BP: (!) 140/92  Pulse: 93  Resp: 18  Temp: 98.1 F (36.7 C)  SpO2: 97%   Vitals:   11/08/23 1429  Weight: 201 lb 3.2 oz (91.3 kg)  Height: 5' 10 (1.778 m)   Body mass index is 28.87 kg/m. Ideal Body Weight: Weight in (lb) to have BMI = 25: 173.9  GEN: no acute distress.  Minimal overweight, looks well HEENT: Atraumatic, Normocephalic.  Ears and Nose: No external deformity. CV: RRR, No M/G/R. No JVD. No thrill. No extra heart sounds. PULM: CTA B, no wheezes,  crackles, rhonchi. No retractions. No resp. distress. No accessory muscle use. ABD: S, NT, ND, +BS. No rebound. No HSM. EXTR: No c/c/e PSYCH: Normally interactive. Conversant.  Lumbar exam: Normal thoracolumbar flexion and extension.  Able to raise on toes without difficulty.  Normal strength, sensation, deep tendon reflex  both lower extremities.  Negative straight leg raise.  Patient notes discomfort and there is spasm about the lumbar paraspinous muscles, more so on the right.  Assessment and Plan: Lumbar pain - Plan: MR Lumbar Spine Wo Contrast  Hypertension, unspecified type - Plan: losartan  (COZAAR ) 25 MG tablet  Anxiety state - Plan: venlafaxine  XR (EFFEXOR  XR) 75 MG 24 hr capsule  Tobacco use - Plan: nicotine  (NICODERM CQ ) 7 mg/24hr patch  Patient seen today with concern of persistent lumbar pain for more than 3 months.  He has tried muscle relaxers, physical therapy without improvement.  At this point he would like to proceed to an MRI which is reasonable.  We will order this for him  Blood pressure is mildly elevated but not bad given that he has been out of his medicine for a couple of weeks.  Will have him start back on a reduced dose of losartan , 25  He will taper off BuSpar  over the course of about a week, then will start on venlafaxine  75.  He would let me know how this works for him  Gave him a prescription for 7 mg nicotine  patch  Signed Harlene Schroeder, MD

## 2023-11-08 ENCOUNTER — Ambulatory Visit (INDEPENDENT_AMBULATORY_CARE_PROVIDER_SITE_OTHER): Payer: BC Managed Care – PPO | Admitting: Family Medicine

## 2023-11-08 VITALS — BP 140/92 | HR 93 | Temp 98.1°F | Resp 18 | Ht 70.0 in | Wt 201.2 lb

## 2023-11-08 DIAGNOSIS — M545 Low back pain, unspecified: Secondary | ICD-10-CM

## 2023-11-08 DIAGNOSIS — F411 Generalized anxiety disorder: Secondary | ICD-10-CM

## 2023-11-08 DIAGNOSIS — Z720 Tobacco use: Secondary | ICD-10-CM | POA: Diagnosis not present

## 2023-11-08 DIAGNOSIS — I10 Essential (primary) hypertension: Secondary | ICD-10-CM | POA: Diagnosis not present

## 2023-11-08 MED ORDER — LOSARTAN POTASSIUM 25 MG PO TABS: 50.0000 mg | ORAL_TABLET | Freq: Every day | ORAL | 3 refills | Status: DC

## 2023-11-08 MED ORDER — VENLAFAXINE HCL ER 75 MG PO CP24: 75.0000 mg | ORAL_CAPSULE | Freq: Every day | ORAL | 3 refills | Status: DC

## 2023-11-08 MED ORDER — NICOTINE 7 MG/24HR TD PT24: 7.0000 mg | MEDICATED_PATCH | Freq: Every day | TRANSDERMAL | 0 refills | Status: AC

## 2023-11-08 NOTE — Patient Instructions (Addendum)
 It was good to see you today, I hope you have a great new year  We will work on getting you set up for an MRI of your lumbar spine  Taper down to 7 mg of nicotine  patch  Lets taper off of BuSpar  over the course of about a week.  Decrease your dose gradually, let me know if any questions regarding how to do this  Once you have stopped BuSpar  can start on venlafaxine /Effexor  75 once daily-let me know how this works for you  Your blood pressure looks good for being out of medication for couple of weeks; will start back on losartan  at 25 mg

## 2023-11-09 NOTE — Addendum Note (Signed)
 Addended by: Abbe Amsterdam C on: 11/09/2023 04:24 PM   Modules accepted: Orders

## 2023-11-26 ENCOUNTER — Ambulatory Visit
Admission: RE | Admit: 2023-11-26 | Discharge: 2023-11-26 | Disposition: A | Discharge status: Home / Self Care | Payer: BC Managed Care – PPO | Source: Ambulatory Visit | Attending: Family Medicine | Admitting: Family Medicine

## 2023-11-26 DIAGNOSIS — M545 Low back pain, unspecified: Secondary | ICD-10-CM

## 2023-11-26 DIAGNOSIS — M5126 Other intervertebral disc displacement, lumbar region: Secondary | ICD-10-CM | POA: Diagnosis not present

## 2023-12-08 ENCOUNTER — Encounter: Payer: Self-pay | Admitting: Family Medicine

## 2023-12-08 ENCOUNTER — Telehealth: Payer: Self-pay

## 2023-12-08 DIAGNOSIS — M79606 Pain in leg, unspecified: Secondary | ICD-10-CM

## 2023-12-08 MED ORDER — TRAMADOL HCL 50 MG PO TABS: 50.0000 mg | ORAL_TABLET | Freq: Three times a day (TID) | ORAL | 0 refills | Status: DC | PRN

## 2023-12-08 NOTE — Telephone Encounter (Signed)
 Copied from CRM [id number]. Topic: General - Other >> Dec 08, 2023 11:44 AM Pinkey ORN wrote: Reason for CRM: Requesting A Call Back From Copland, Harlene BROCKS, MD >> Dec 08, 2023 11:47 AM Pinkey ORN wrote: Patient states he's still having severe lower back pain, even after imaging and discovering the bulging disc. He's wanting to know what are the next steps he should take, patient also mentioned that he is NOT on any medication for the pain he's experiencing. Patient is requesting a call back at [phone]

## 2023-12-08 NOTE — Addendum Note (Signed)
 Addended by: Gates Kasal C on: 12/08/2023 04:53 PM   Modules accepted: Orders

## 2023-12-08 NOTE — Addendum Note (Signed)
 Addended by: Gates Kasal C on: 12/08/2023 04:29 PM   Modules accepted: Orders

## 2023-12-08 NOTE — Telephone Encounter (Signed)
 Patient also reached out via MyChart, I contacted him there

## 2023-12-14 DIAGNOSIS — F1011 Alcohol abuse, in remission: Secondary | ICD-10-CM | POA: Diagnosis not present

## 2023-12-16 MED ORDER — TRAMADOL HCL 50 MG PO TABS: 50.0000 mg | ORAL_TABLET | Freq: Three times a day (TID) | ORAL | 0 refills | Status: DC | PRN

## 2023-12-16 NOTE — Addendum Note (Signed)
Addended by: Abbe Amsterdam C on: 12/16/2023 06:05 PM   Modules accepted: Orders

## 2023-12-29 DIAGNOSIS — F1011 Alcohol abuse, in remission: Secondary | ICD-10-CM | POA: Diagnosis not present

## 2023-12-30 DIAGNOSIS — F1011 Alcohol abuse, in remission: Secondary | ICD-10-CM | POA: Diagnosis not present

## 2023-12-30 MED ORDER — HYDROCODONE-ACETAMINOPHEN 5-325 MG PO TABS: 1.0000 | ORAL_TABLET | Freq: Three times a day (TID) | ORAL | 0 refills | Status: DC | PRN

## 2023-12-30 NOTE — Addendum Note (Signed)
 Addended by: Pearline Cables on: 12/30/2023 05:42 AM   Modules accepted: Orders

## 2024-01-07 ENCOUNTER — Other Ambulatory Visit: Payer: Self-pay | Admitting: Family Medicine

## 2024-01-07 MED ORDER — TRAMADOL HCL 50 MG PO TABS: 50.0000 mg | ORAL_TABLET | Freq: Three times a day (TID) | ORAL | 0 refills | Status: DC | PRN

## 2024-01-07 MED ORDER — HYDROCODONE-ACETAMINOPHEN 5-325 MG PO TABS: 1.0000 | ORAL_TABLET | Freq: Three times a day (TID) | ORAL | 0 refills | Status: DC | PRN

## 2024-01-07 NOTE — Addendum Note (Signed)
 Addended by: Abbe Amsterdam C on: 01/07/2024 01:37 PM   Modules accepted: Orders

## 2024-01-10 ENCOUNTER — Telehealth: Payer: Self-pay | Admitting: Family Medicine

## 2024-01-10 DIAGNOSIS — M5136 Other intervertebral disc degeneration, lumbar region with discogenic back pain only: Secondary | ICD-10-CM | POA: Diagnosis not present

## 2024-01-10 NOTE — Telephone Encounter (Signed)
 Copied from CRM [id number]. Topic: Medical Record Request - Records Request >> Jan 10, 2024  1:54 PM Martinique E wrote: Reason for CRM: Patient is requesting his physical therapy records to be sent over to [facility] as they are working on getting patient referred to pain management. Fax number is [phone] Attn: Lauren.

## 2024-01-11 NOTE — Telephone Encounter (Signed)
 MyChart message sent to pt letting him know that he will need to call PT to release these recs.

## 2024-01-14 DIAGNOSIS — M6281 Muscle weakness (generalized): Secondary | ICD-10-CM | POA: Diagnosis not present

## 2024-01-14 DIAGNOSIS — M5451 Vertebrogenic low back pain: Secondary | ICD-10-CM | POA: Diagnosis not present

## 2024-01-18 DIAGNOSIS — M5451 Vertebrogenic low back pain: Secondary | ICD-10-CM | POA: Diagnosis not present

## 2024-01-18 DIAGNOSIS — M6281 Muscle weakness (generalized): Secondary | ICD-10-CM | POA: Diagnosis not present

## 2024-01-21 DIAGNOSIS — F1011 Alcohol abuse, in remission: Secondary | ICD-10-CM | POA: Diagnosis not present

## 2024-01-21 MED ORDER — TRAMADOL HCL 50 MG PO TABS: 50.0000 mg | ORAL_TABLET | Freq: Three times a day (TID) | ORAL | 0 refills | Status: DC | PRN

## 2024-01-21 MED ORDER — HYDROCODONE-ACETAMINOPHEN 5-325 MG PO TABS: 1.0000 | ORAL_TABLET | Freq: Three times a day (TID) | ORAL | 0 refills | Status: DC | PRN

## 2024-01-21 NOTE — Addendum Note (Signed)
 Addended by: Abbe Amsterdam C on: 01/21/2024 05:35 AM   Modules accepted: Orders

## 2024-01-25 DIAGNOSIS — M5451 Vertebrogenic low back pain: Secondary | ICD-10-CM | POA: Diagnosis not present

## 2024-01-25 DIAGNOSIS — M6281 Muscle weakness (generalized): Secondary | ICD-10-CM | POA: Diagnosis not present

## 2024-01-27 DIAGNOSIS — M5136 Other intervertebral disc degeneration, lumbar region with discogenic back pain only: Secondary | ICD-10-CM | POA: Diagnosis not present

## 2024-01-31 DIAGNOSIS — M6281 Muscle weakness (generalized): Secondary | ICD-10-CM | POA: Diagnosis not present

## 2024-01-31 DIAGNOSIS — M5451 Vertebrogenic low back pain: Secondary | ICD-10-CM | POA: Diagnosis not present

## 2024-02-01 DIAGNOSIS — M5416 Radiculopathy, lumbar region: Secondary | ICD-10-CM | POA: Diagnosis not present

## 2024-02-11 DIAGNOSIS — M6281 Muscle weakness (generalized): Secondary | ICD-10-CM | POA: Diagnosis not present

## 2024-02-11 DIAGNOSIS — M5451 Vertebrogenic low back pain: Secondary | ICD-10-CM | POA: Diagnosis not present

## 2024-02-14 DIAGNOSIS — M5451 Vertebrogenic low back pain: Secondary | ICD-10-CM | POA: Diagnosis not present

## 2024-02-14 DIAGNOSIS — M6281 Muscle weakness (generalized): Secondary | ICD-10-CM | POA: Diagnosis not present

## 2024-02-15 DIAGNOSIS — F411 Generalized anxiety disorder: Secondary | ICD-10-CM | POA: Diagnosis not present

## 2024-02-15 DIAGNOSIS — F410 Panic disorder [episodic paroxysmal anxiety] without agoraphobia: Secondary | ICD-10-CM | POA: Diagnosis not present

## 2024-02-16 ENCOUNTER — Encounter: Payer: Self-pay | Admitting: Family Medicine

## 2024-02-16 ENCOUNTER — Telehealth: Payer: Self-pay

## 2024-02-16 DIAGNOSIS — M79606 Pain in leg, unspecified: Secondary | ICD-10-CM

## 2024-02-16 NOTE — Telephone Encounter (Signed)
 See MyChart message

## 2024-02-16 NOTE — Telephone Encounter (Signed)
 Copied from CRM [id number]. Topic: General - Other >> Feb 16, 2024 11:49 AM Howard Macho wrote: Reason for CRM: patient would like to be called concerning his back. Patient stated he had a spinal injection and it did not do anything

## 2024-02-17 DIAGNOSIS — M6281 Muscle weakness (generalized): Secondary | ICD-10-CM | POA: Diagnosis not present

## 2024-02-17 DIAGNOSIS — M5451 Vertebrogenic low back pain: Secondary | ICD-10-CM | POA: Diagnosis not present

## 2024-02-17 MED ORDER — HYDROCODONE-ACETAMINOPHEN 5-325 MG PO TABS
ORAL_TABLET | ORAL | 0 refills | Status: DC
Start: 2024-02-17 — End: 2024-03-13

## 2024-02-17 MED ORDER — TRAMADOL HCL 50 MG PO TABS: ORAL_TABLET | ORAL | 0 refills | Status: DC

## 2024-02-17 NOTE — Addendum Note (Signed)
 Addended by: Gates Kasal C on: 02/17/2024 01:37 PM   Modules accepted: Orders

## 2024-02-24 ENCOUNTER — Telehealth: Payer: Self-pay | Admitting: Family Medicine

## 2024-02-24 NOTE — Telephone Encounter (Signed)
 Copied from CRM [id number]. Topic: Referral - Question >> Feb 24, 2024  2:44 PM Jethro Morrison wrote: Reason for CRM: RACHEL [id number] WITH [facility] HAS SOME QUESTIONS ABOUT THE REFERRAL

## 2024-02-25 NOTE — Telephone Encounter (Signed)
 Tried calling the number that was provided. The message on the line stated that the office is closed.

## 2024-02-29 NOTE — Telephone Encounter (Signed)
 Result was faxed to the number provided.

## 2024-02-29 NOTE — Telephone Encounter (Signed)
 Copied from CRM [id number]. Topic: General - Other >> Feb 29, 2024  3:26 PM Adonis Hoot wrote: Reason for CRM: Kayleen Party from pain management called in stating that they received the referral from provider.They are wanting to know if a lumbar MRI was done on patient.If so they are needing that documentation to go along with referral for provider to review. Fax#:[phone] Attn:Rachel

## 2024-03-11 DIAGNOSIS — M545 Low back pain, unspecified: Secondary | ICD-10-CM | POA: Diagnosis not present

## 2024-03-11 DIAGNOSIS — M549 Dorsalgia, unspecified: Secondary | ICD-10-CM | POA: Diagnosis not present

## 2024-03-13 ENCOUNTER — Other Ambulatory Visit: Payer: Self-pay | Admitting: Family Medicine

## 2024-03-13 DIAGNOSIS — F1021 Alcohol dependence, in remission: Secondary | ICD-10-CM | POA: Diagnosis not present

## 2024-03-13 DIAGNOSIS — F419 Anxiety disorder, unspecified: Secondary | ICD-10-CM | POA: Diagnosis not present

## 2024-03-13 DIAGNOSIS — F1611 Hallucinogen abuse, in remission: Secondary | ICD-10-CM | POA: Diagnosis not present

## 2024-03-13 DIAGNOSIS — Z630 Problems in relationship with spouse or partner: Secondary | ICD-10-CM | POA: Diagnosis not present

## 2024-03-13 DIAGNOSIS — F411 Generalized anxiety disorder: Secondary | ICD-10-CM

## 2024-03-13 MED ORDER — TRAMADOL HCL 50 MG PO TABS: ORAL_TABLET | ORAL | 0 refills | Status: DC

## 2024-03-13 MED ORDER — HYDROCODONE-ACETAMINOPHEN 5-325 MG PO TABS: ORAL_TABLET | ORAL | 0 refills | Status: DC

## 2024-03-13 NOTE — Addendum Note (Signed)
 Addended by: Gates Kasal C on: 03/13/2024 07:27 PM   Modules accepted: Orders

## 2024-03-16 DIAGNOSIS — F411 Generalized anxiety disorder: Secondary | ICD-10-CM | POA: Diagnosis not present

## 2024-03-16 DIAGNOSIS — F3340 Major depressive disorder, recurrent, in remission, unspecified: Secondary | ICD-10-CM | POA: Diagnosis not present

## 2024-03-16 DIAGNOSIS — F1021 Alcohol dependence, in remission: Secondary | ICD-10-CM | POA: Diagnosis not present

## 2024-03-16 DIAGNOSIS — F410 Panic disorder [episodic paroxysmal anxiety] without agoraphobia: Secondary | ICD-10-CM | POA: Diagnosis not present

## 2024-03-20 DIAGNOSIS — F419 Anxiety disorder, unspecified: Secondary | ICD-10-CM | POA: Diagnosis not present

## 2024-03-20 DIAGNOSIS — F1611 Hallucinogen abuse, in remission: Secondary | ICD-10-CM | POA: Diagnosis not present

## 2024-03-20 DIAGNOSIS — F1021 Alcohol dependence, in remission: Secondary | ICD-10-CM | POA: Diagnosis not present

## 2024-03-20 DIAGNOSIS — Z630 Problems in relationship with spouse or partner: Secondary | ICD-10-CM | POA: Diagnosis not present

## 2024-03-30 ENCOUNTER — Other Ambulatory Visit: Payer: Self-pay | Admitting: Family Medicine

## 2024-03-30 DIAGNOSIS — M545 Low back pain, unspecified: Secondary | ICD-10-CM

## 2024-03-30 MED ORDER — CYCLOBENZAPRINE HCL 10 MG PO TABS: 10.0000 mg | ORAL_TABLET | Freq: Every day | ORAL | 0 refills | Status: AC

## 2024-04-03 DIAGNOSIS — F1021 Alcohol dependence, in remission: Secondary | ICD-10-CM | POA: Diagnosis not present

## 2024-04-03 DIAGNOSIS — Z630 Problems in relationship with spouse or partner: Secondary | ICD-10-CM | POA: Diagnosis not present

## 2024-04-03 DIAGNOSIS — F1611 Hallucinogen abuse, in remission: Secondary | ICD-10-CM | POA: Diagnosis not present

## 2024-04-03 DIAGNOSIS — F419 Anxiety disorder, unspecified: Secondary | ICD-10-CM | POA: Diagnosis not present

## 2024-04-10 DIAGNOSIS — F419 Anxiety disorder, unspecified: Secondary | ICD-10-CM | POA: Diagnosis not present

## 2024-04-10 DIAGNOSIS — F1611 Hallucinogen abuse, in remission: Secondary | ICD-10-CM | POA: Diagnosis not present

## 2024-04-10 DIAGNOSIS — Z630 Problems in relationship with spouse or partner: Secondary | ICD-10-CM | POA: Diagnosis not present

## 2024-04-10 DIAGNOSIS — F1021 Alcohol dependence, in remission: Secondary | ICD-10-CM | POA: Diagnosis not present

## 2024-04-11 DIAGNOSIS — F3340 Major depressive disorder, recurrent, in remission, unspecified: Secondary | ICD-10-CM | POA: Diagnosis not present

## 2024-04-11 DIAGNOSIS — F410 Panic disorder [episodic paroxysmal anxiety] without agoraphobia: Secondary | ICD-10-CM | POA: Diagnosis not present

## 2024-04-11 DIAGNOSIS — F1021 Alcohol dependence, in remission: Secondary | ICD-10-CM | POA: Diagnosis not present

## 2024-04-11 DIAGNOSIS — F411 Generalized anxiety disorder: Secondary | ICD-10-CM | POA: Diagnosis not present

## 2024-04-17 DIAGNOSIS — F419 Anxiety disorder, unspecified: Secondary | ICD-10-CM | POA: Diagnosis not present

## 2024-04-17 DIAGNOSIS — F1021 Alcohol dependence, in remission: Secondary | ICD-10-CM | POA: Diagnosis not present

## 2024-04-17 DIAGNOSIS — F1611 Hallucinogen abuse, in remission: Secondary | ICD-10-CM | POA: Diagnosis not present

## 2024-04-17 DIAGNOSIS — Z630 Problems in relationship with spouse or partner: Secondary | ICD-10-CM | POA: Diagnosis not present

## 2024-04-17 MED ORDER — TRAMADOL HCL 50 MG PO TABS: ORAL_TABLET | ORAL | 2 refills | Status: DC

## 2024-04-17 MED ORDER — HYDROCODONE-ACETAMINOPHEN 5-325 MG PO TABS: ORAL_TABLET | ORAL | 0 refills | Status: DC

## 2024-04-17 NOTE — Addendum Note (Signed)
 Addended by: Gates Kasal C on: 04/17/2024 12:10 PM   Modules accepted: Orders

## 2024-04-18 DIAGNOSIS — M9903 Segmental and somatic dysfunction of lumbar region: Secondary | ICD-10-CM | POA: Diagnosis not present

## 2024-04-18 DIAGNOSIS — M9905 Segmental and somatic dysfunction of pelvic region: Secondary | ICD-10-CM | POA: Diagnosis not present

## 2024-04-18 DIAGNOSIS — M9902 Segmental and somatic dysfunction of thoracic region: Secondary | ICD-10-CM | POA: Diagnosis not present

## 2024-04-19 DIAGNOSIS — M9902 Segmental and somatic dysfunction of thoracic region: Secondary | ICD-10-CM | POA: Diagnosis not present

## 2024-04-19 DIAGNOSIS — M9905 Segmental and somatic dysfunction of pelvic region: Secondary | ICD-10-CM | POA: Diagnosis not present

## 2024-04-19 DIAGNOSIS — M9903 Segmental and somatic dysfunction of lumbar region: Secondary | ICD-10-CM | POA: Diagnosis not present

## 2024-04-24 DIAGNOSIS — M9902 Segmental and somatic dysfunction of thoracic region: Secondary | ICD-10-CM | POA: Diagnosis not present

## 2024-04-24 DIAGNOSIS — M9903 Segmental and somatic dysfunction of lumbar region: Secondary | ICD-10-CM | POA: Diagnosis not present

## 2024-04-24 DIAGNOSIS — M9905 Segmental and somatic dysfunction of pelvic region: Secondary | ICD-10-CM | POA: Diagnosis not present

## 2024-04-26 DIAGNOSIS — M2140 Flat foot [pes planus] (acquired), unspecified foot: Secondary | ICD-10-CM | POA: Diagnosis not present

## 2024-04-26 DIAGNOSIS — M722 Plantar fascial fibromatosis: Secondary | ICD-10-CM | POA: Diagnosis not present

## 2024-04-26 DIAGNOSIS — M9902 Segmental and somatic dysfunction of thoracic region: Secondary | ICD-10-CM | POA: Diagnosis not present

## 2024-04-26 DIAGNOSIS — M9905 Segmental and somatic dysfunction of pelvic region: Secondary | ICD-10-CM | POA: Diagnosis not present

## 2024-04-26 DIAGNOSIS — M25579 Pain in unspecified ankle and joints of unspecified foot: Secondary | ICD-10-CM | POA: Diagnosis not present

## 2024-04-26 DIAGNOSIS — M9903 Segmental and somatic dysfunction of lumbar region: Secondary | ICD-10-CM | POA: Diagnosis not present

## 2024-05-01 DIAGNOSIS — Z630 Problems in relationship with spouse or partner: Secondary | ICD-10-CM | POA: Diagnosis not present

## 2024-05-01 DIAGNOSIS — M9903 Segmental and somatic dysfunction of lumbar region: Secondary | ICD-10-CM | POA: Diagnosis not present

## 2024-05-01 DIAGNOSIS — F419 Anxiety disorder, unspecified: Secondary | ICD-10-CM | POA: Diagnosis not present

## 2024-05-01 DIAGNOSIS — F1611 Hallucinogen abuse, in remission: Secondary | ICD-10-CM | POA: Diagnosis not present

## 2024-05-01 DIAGNOSIS — M9902 Segmental and somatic dysfunction of thoracic region: Secondary | ICD-10-CM | POA: Diagnosis not present

## 2024-05-01 DIAGNOSIS — M9905 Segmental and somatic dysfunction of pelvic region: Secondary | ICD-10-CM | POA: Diagnosis not present

## 2024-05-01 DIAGNOSIS — F1021 Alcohol dependence, in remission: Secondary | ICD-10-CM | POA: Diagnosis not present

## 2024-05-03 DIAGNOSIS — M9902 Segmental and somatic dysfunction of thoracic region: Secondary | ICD-10-CM | POA: Diagnosis not present

## 2024-05-03 DIAGNOSIS — M546 Pain in thoracic spine: Secondary | ICD-10-CM | POA: Diagnosis not present

## 2024-05-03 DIAGNOSIS — M9903 Segmental and somatic dysfunction of lumbar region: Secondary | ICD-10-CM | POA: Diagnosis not present

## 2024-05-03 DIAGNOSIS — M9905 Segmental and somatic dysfunction of pelvic region: Secondary | ICD-10-CM | POA: Diagnosis not present

## 2024-05-08 DIAGNOSIS — F1611 Hallucinogen abuse, in remission: Secondary | ICD-10-CM | POA: Diagnosis not present

## 2024-05-08 DIAGNOSIS — M9903 Segmental and somatic dysfunction of lumbar region: Secondary | ICD-10-CM | POA: Diagnosis not present

## 2024-05-08 DIAGNOSIS — F1021 Alcohol dependence, in remission: Secondary | ICD-10-CM | POA: Diagnosis not present

## 2024-05-08 DIAGNOSIS — Z630 Problems in relationship with spouse or partner: Secondary | ICD-10-CM | POA: Diagnosis not present

## 2024-05-08 DIAGNOSIS — F419 Anxiety disorder, unspecified: Secondary | ICD-10-CM | POA: Diagnosis not present

## 2024-05-08 DIAGNOSIS — M9905 Segmental and somatic dysfunction of pelvic region: Secondary | ICD-10-CM | POA: Diagnosis not present

## 2024-05-08 DIAGNOSIS — M9902 Segmental and somatic dysfunction of thoracic region: Secondary | ICD-10-CM | POA: Diagnosis not present

## 2024-05-09 DIAGNOSIS — F1021 Alcohol dependence, in remission: Secondary | ICD-10-CM | POA: Diagnosis not present

## 2024-05-09 DIAGNOSIS — F3340 Major depressive disorder, recurrent, in remission, unspecified: Secondary | ICD-10-CM | POA: Diagnosis not present

## 2024-05-09 DIAGNOSIS — F410 Panic disorder [episodic paroxysmal anxiety] without agoraphobia: Secondary | ICD-10-CM | POA: Diagnosis not present

## 2024-05-09 DIAGNOSIS — F411 Generalized anxiety disorder: Secondary | ICD-10-CM | POA: Diagnosis not present

## 2024-05-10 DIAGNOSIS — M9905 Segmental and somatic dysfunction of pelvic region: Secondary | ICD-10-CM | POA: Diagnosis not present

## 2024-05-10 DIAGNOSIS — M2140 Flat foot [pes planus] (acquired), unspecified foot: Secondary | ICD-10-CM | POA: Diagnosis not present

## 2024-05-10 DIAGNOSIS — M9903 Segmental and somatic dysfunction of lumbar region: Secondary | ICD-10-CM | POA: Diagnosis not present

## 2024-05-10 DIAGNOSIS — M9902 Segmental and somatic dysfunction of thoracic region: Secondary | ICD-10-CM | POA: Diagnosis not present

## 2024-05-16 MED ORDER — HYDROCODONE-ACETAMINOPHEN 2.5-325 MG PO TABS: ORAL_TABLET | ORAL | 0 refills | Status: DC

## 2024-05-16 NOTE — Progress Notes (Unsigned)
 Referring Physician:  Watt Harlene BROCKS, MD [address] [place],  [place] [address]  Primary Physician:  Watt Harlene BROCKS, MD  History of Present Illness: 05/18/2024 [name] is here today with a chief complaint of  chronic back pain and new onset arm and leg symptoms. He was referred by Dr. Watt for evaluation of chronic back pain and new arm and leg symptoms.  He experiences severe back pain for over a year, originating from a specific point in his back. Various treatments, including chiropractic care, acupuncture, and physical therapy, have been attempted, along with multiple imaging studies. An epidural spine injection did not provide significant relief.  In the past two days, he developed new symptoms of 'electricity' running up and down his left arm and leg. Tingling is present in the center of his arm and all five fingers, with pain down the left side of his leg, under the calf, and into his toes. These symptoms worsen at night, disrupting sleep, allowing only 30 minutes of rest at a time.  He takes tramadol  in the morning and hydrocodone  at night for pain. Ibuprofen or Tylenol  is used during the day, avoiding concurrent use with hydrocodone . Movement alleviates symptoms, while prolonged sitting or standing worsens them. Ice and wet heat provide relief, whereas dry heat exacerbates pain.  He has not had a nerve conduction study or EMG. He reports some urinary changes, such as difficulty starting urination in the morning and incomplete bladder emptying.   Conservative measures: chiropractor, acupuncture, dry needling Physical therapy:  has participated with [facility] in March 2025-just once Multimodal medical therapy including regular antiinflammatories:  Norco, Tramadol , Diclofenac, Flexeril , Meloxicam  Injections:  02/01/2024 ESI L4-L5 with Dr Darlis  Past Surgery: no spine surgery  Stephen Castro has no symptoms of cervical myelopathy.  The  symptoms are causing a significant impact on the patient's life.   I have utilized the care everywhere function in epic to review the outside records available from external health systems.  Review of Systems:  A 10 point review of systems is negative, except for the pertinent positives and negatives detailed in the HPI.  Past Medical History: Past Medical History:  Diagnosis Date   Aneurysm (HCC) 2020   Anxiety    managed per pt   Depression    haven't really dealt with in quite some time   High blood pressure    slightly elevated    Past Surgical History: History reviewed. No pertinent surgical history.  Allergies: Allergies as of 05/18/2024 - Review Complete 05/18/2024  Allergen Reaction Noted   Corticosteroids Other (See Comments) 05/05/2023    Medications:  Current Outpatient Medications:    albuterol  (VENTOLIN  HFA) 108 (90 Base) MCG/ACT inhaler, Inhale 2 puffs into the lungs every 6 (six) hours as needed for wheezing or shortness of breath., Disp: 8.5 g, Rfl: 1   ALPRAZolam  (XANAX ) 0.5 MG tablet, Take 0.25-0.5 mg by mouth every 6 (six) hours as needed., Disp: , Rfl:    busPIRone  (BUSPAR ) 15 MG tablet, Take 1.5 tablets (22.5 mg total) by mouth 2 (two) times daily. Take 1.5 tablets twice daily, Disp: 270 tablet, Rfl: 2   cyclobenzaprine  (FLEXERIL ) 10 MG tablet, Take 1 tablet (10 mg total) by mouth at bedtime. Use as needed for muscle spasm and pain.  Use this or hydrocodone  pain medication, not both, Disp: 20 tablet, Rfl: 0   escitalopram (LEXAPRO) 10 MG tablet, Take 10 mg by mouth daily., Disp: ,  Rfl:    escitalopram (LEXAPRO) 20 MG tablet, Take 20 mg by mouth daily., Disp: , Rfl:    HYDROcodone -Acetaminophen  2.5-325 MG TABS, Take 1 by mouth in the evening as needed for back pain, Disp: 30 tablet, Rfl: 0   losartan  (COZAAR ) 25 MG tablet, Take 2 tablets (50 mg total) by mouth daily. Needs appt, Disp: 60 tablet, Rfl: 0   naloxone (NARCAN) nasal spray 4 mg/0.1 mL,  SMARTSIG:Both Nares, Disp: , Rfl:    nicotine  (NICODERM CQ ) 7 mg/24hr patch, Place 1 patch (7 mg total) onto the skin daily., Disp: 28 patch, Rfl: 0   traMADol  (ULTRAM ) 50 MG tablet, Take one by mouth in the morning as needed for back pain, Disp: 30 tablet, Rfl: 2   tretinoin  (RETIN-A ) 0.05 % cream, Apply topically at bedtime. Use every 2-3 days (Patient not taking: Reported on 05/18/2024), Disp: 45 g, Rfl: 2  Social History: Social History   Tobacco Use   Smoking status: Every Day    Current packs/day: 0.50    Types: Cigarettes   Smokeless tobacco: Never   Tobacco comments:    08/08/2019 update, trying to cut back   Vaping Use   Vaping status: Former  Substance Use Topics   Alcohol  use: Yes    Comment: social   Drug use: Yes    Types: Marijuana    Family Medical History: Family History  Problem Relation Age of Onset   Stroke Mother        carotid artery dissection during massage    Aneurysm Paternal Grandmother    Hypertension Other        both sides of family   Lung cancer Other        both sides of family    Cerebral aneurysm Maternal Uncle    Migraines Neg Hx        none that he is aware of    Physical Examination: Vitals:   05/18/24 1401  BP: (!) 142/90    General: Patient is in no apparent distress. Attention to examination is appropriate.  Neck:   Supple.  Full range of motion.  Respiratory: Patient is breathing without any difficulty.  +TTP around TL junction in midline  NEUROLOGICAL:     Awake, alert, oriented to person, place, and time.  Speech is clear and fluent.   Cranial Nerves: Pupils equal round and reactive to light.  Facial tone is symmetric.  Facial sensation is symmetric. Shoulder shrug is symmetric. Tongue protrusion is midline.  There is no pronator drift.  Strength: Side Biceps Triceps Deltoid Interossei Grip Wrist Ext. Wrist Flex.  R 5 5 5 5 5 5 5   L 5 5 5 5 5 5 5    Side Iliopsoas Quads Hamstring PF DF EHL  R 5 5 5 5 5 5   L 5 5 5  5 5 5    Reflexes are 1+ and symmetric at the biceps, triceps, brachioradialis, patella and achilles.   Hoffman's is absent.   Bilateral upper and lower extremity sensation is intact to light touch.    No evidence of dysmetria noted.  Gait is normal.     Medical Decision Making  Imaging: MRI L spine 11/26/2023 IMPRESSION: 1. Small right foraminal and far lateral disc protrusion at L4-L5 contacting the exiting right L4 nerve root. 2. Small central disc protrusion at L5-S1 without stenosis or impingement.     Electronically Signed   By: Elsie ONEIDA Shoulder M.D.   On: 12/07/2023 20:39  I have personally  reviewed the images and agree with the above interpretation.  Assessment and Plan: Stephen Castro is a pleasant 33 y.o. male with Chronic back pain.    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 Previous treatments ineffective. No significant compression on imaging. Differential includes thoracic spine pathology. - Review thoracic spine MRI after it has occurred. - Order nerve conduction study (EMG) of LLE. - Consider nerve ablation/RFA - would suggest L1-2 level - Discuss potential for spinal cord stimulator if other interventions fail and there is appropriate support from EMG  Left arm and leg tingling and pain Recent onset of tingling and pain in left arm and leg. Symptoms likely separate from back pain. - Order cervical spine imaging if symptoms persist.  Medication management We reviewed his current medications.  I have recommended that he consider naproxen  at nighttime with his pain medication and muscle relaxants to help him sleep.  I do not feel that he is at significant risk for respiratory depression as long as he takes his medications as recommended.  We discussed that he should continue using a muscle relaxant and NSAID in addition to his other medications as prescribed by his primary care provider.  I would normally  recommend naproxen  2 over-the-counter pills twice daily in addition to his muscle relaxant.  If his nerve conduction study shows evidence of a neuropathy, we might consider spinal cord stimulation if there is no other possible intervention to consider.  Depending on the thoracic spine MRI findings, there may be consideration of thoracic epidural injections.  If no site for intervention is shown on his thoracic MRI scan or with his nerve conduction study, we will need to obtain lumbar spine flexion-extension x-rays to evaluate for possible instability at L1-2.  I think it is fine for him to remain active as much as he can tolerate.  I spent a total of 30 minutes in this patient's care today. This time was spent reviewing pertinent records including imaging studies, obtaining and confirming history, performing a directed evaluation, formulating and discussing my recommendations, and documenting the visit within the medical record.     Thank you for involving me in the care of this patient.      Lamona Eimer K. Clois MD, [facility]

## 2024-05-16 NOTE — Addendum Note (Signed)
 Addended by: WATT RAISIN C on: 05/16/2024 09:12 PM   Modules accepted: Orders

## 2024-05-17 ENCOUNTER — Other Ambulatory Visit: Payer: Self-pay | Admitting: Family Medicine

## 2024-05-17 DIAGNOSIS — I10 Essential (primary) hypertension: Secondary | ICD-10-CM

## 2024-05-18 ENCOUNTER — Encounter: Payer: Self-pay | Admitting: Neurosurgery

## 2024-05-18 ENCOUNTER — Encounter: Payer: Self-pay | Admitting: Neurology

## 2024-05-18 ENCOUNTER — Ambulatory Visit (INDEPENDENT_AMBULATORY_CARE_PROVIDER_SITE_OTHER): Payer: Self-pay | Admitting: Neurosurgery

## 2024-05-18 VITALS — BP 142/90 | Ht 70.0 in | Wt 190.0 lb

## 2024-05-18 DIAGNOSIS — M5136 Other intervertebral disc degeneration, lumbar region with discogenic back pain only: Secondary | ICD-10-CM | POA: Diagnosis not present

## 2024-05-18 DIAGNOSIS — G629 Polyneuropathy, unspecified: Secondary | ICD-10-CM

## 2024-05-18 DIAGNOSIS — G8929 Other chronic pain: Secondary | ICD-10-CM

## 2024-05-18 DIAGNOSIS — M5126 Other intervertebral disc displacement, lumbar region: Secondary | ICD-10-CM | POA: Diagnosis not present

## 2024-05-18 DIAGNOSIS — R200 Anesthesia of skin: Secondary | ICD-10-CM

## 2024-05-18 DIAGNOSIS — M5442 Lumbago with sciatica, left side: Secondary | ICD-10-CM

## 2024-05-19 ENCOUNTER — Other Ambulatory Visit: Payer: Self-pay

## 2024-05-19 ENCOUNTER — Telehealth: Payer: Self-pay

## 2024-05-19 DIAGNOSIS — R202 Paresthesia of skin: Secondary | ICD-10-CM

## 2024-05-19 MED ORDER — CYCLOBENZAPRINE HCL 10 MG PO TABS: 10.0000 mg | ORAL_TABLET | Freq: Two times a day (BID) | ORAL | 1 refills | Status: DC | PRN

## 2024-05-19 NOTE — Telephone Encounter (Signed)
 Pharmacy Patient Advocate Encounter   Received notification from CoverMyMeds that prior authorization for traMADol  HCl 50MG  tablets is required/requested.   Insurance verification completed.   The patient is insured through [facility] .   Per test claim: PA required; PA submitted to above mentioned insurance via CoverMyMeds Key/confirmation #/EOC [id number] Status is pending

## 2024-05-19 NOTE — Addendum Note (Signed)
 Addended by: WATT RAISIN C on: 05/19/2024 01:53 PM   Modules accepted: Orders

## 2024-05-22 ENCOUNTER — Ambulatory Visit: Payer: Self-pay

## 2024-05-22 DIAGNOSIS — M9902 Segmental and somatic dysfunction of thoracic region: Secondary | ICD-10-CM | POA: Diagnosis not present

## 2024-05-22 DIAGNOSIS — M2140 Flat foot [pes planus] (acquired), unspecified foot: Secondary | ICD-10-CM | POA: Diagnosis not present

## 2024-05-22 DIAGNOSIS — M9903 Segmental and somatic dysfunction of lumbar region: Secondary | ICD-10-CM | POA: Diagnosis not present

## 2024-05-22 DIAGNOSIS — M9905 Segmental and somatic dysfunction of pelvic region: Secondary | ICD-10-CM | POA: Diagnosis not present

## 2024-05-22 DIAGNOSIS — M9908 Segmental and somatic dysfunction of rib cage: Secondary | ICD-10-CM | POA: Diagnosis not present

## 2024-05-22 NOTE — Telephone Encounter (Signed)
 FYI Only or Action Required?: Action required by provider: request for appointment.  Patient was last seen in primary care on 11/08/2023 by Copland, Harlene BROCKS, MD.  Called Nurse Triage reporting Back Pain.  Symptoms began several days ago.  Interventions attempted: OTC medications:  SABRA  Symptoms are: gradually worsening. Back pain x 1 year, worsening the past few days. No availability with PCP. Asking to be worked in. Please advise pt.  Triage Disposition: See HCP Within 4 Hours (Or PCP Triage)  Patient/caregiver understands and will follow disposition?: Yes Copied from CRM [id number]. Topic: Clinical - Red Word Triage >> May 22, 2024 11:03 AM Franky GRADE wrote: Red Word that prompted transfer to Nurse Triage: Patient is experiencing severe back pain for about a year with symptoms worsening over time. Reason for Disposition  [1] Pain radiates into the thigh or further down the leg AND [2] both legs  Answer Assessment - Initial Assessment Questions 1. ONSET: When did the pain begin? (e.g., minutes, hours, days)     1 year 2. LOCATION: Where does it hurt? (upper, mid or lower back)     Lower 3. SEVERITY: How bad is the pain?  (e.g., Scale 1-10; mild, moderate, or severe)     severe 4. PATTERN: Is the pain constant? (e.g., yes, no; constant, intermittent)      Comes and goes 5. RADIATION: Does the pain shoot into your legs or somewhere else?     legs 6. CAUSE:  What do you think is causing the back pain?      unsure 7. BACK OVERUSE:  Any recent lifting of heavy objects, strenuous work or exercise?     no 8. MEDICINES: What have you taken so far for the pain? (e.g., nothing, acetaminophen , NSAIDS)     yes 9. NEUROLOGIC SYMPTOMS: Do you have any weakness, numbness, or problems with bowel/bladder control?     no 10. OTHER SYMPTOMS: Do you have any other symptoms? (e.g., fever, abdomen pain, burning with urination, blood in urine)       no 11. PREGNANCY: Is there any  chance you are pregnant? When was your last menstrual period?       N/a  Protocols used: Back Pain-A-AH

## 2024-05-23 DIAGNOSIS — M9905 Segmental and somatic dysfunction of pelvic region: Secondary | ICD-10-CM | POA: Diagnosis not present

## 2024-05-23 DIAGNOSIS — M9903 Segmental and somatic dysfunction of lumbar region: Secondary | ICD-10-CM | POA: Diagnosis not present

## 2024-05-23 DIAGNOSIS — M2140 Flat foot [pes planus] (acquired), unspecified foot: Secondary | ICD-10-CM | POA: Diagnosis not present

## 2024-05-23 NOTE — Progress Notes (Unsigned)
 [facility] [address], [address] [place], [place] [address] [phone]  Date:  05/24/2024   Name:  Stephen Castro   DOB:  1991/03/29   MRN:  [id number]  PCP:  Watt Harlene BROCKS, MD    Chief Complaint: No chief complaint on file.   History of Present Illness:  Stephen Castro is a 32 y.o. very pleasant male patient who presents with the following:  Patient seen today for concern regarding back pain.  He also has history of hypertension, migraine headache, benign brain aneurysm which we are monitoring, previous history of alcohol  nitrous oxide abuse Most recent visit with myself was in [place] that time he noted back pain which had been present for about 3 months.  We had done PT and also Flexeril  but he was still struggling.  We did a lumbar MRI 11/22/2023: IMPRESSION: 1. Small right foraminal and far lateral disc protrusion at L4-L5 contacting the exiting right L4 nerve root. 2. Small central disc protrusion at L5-S1 without stenosis or impingement.    He has since seen both [place] [facility] and [place] [facility].  He just recently saw Dr. Katrina with [facility], visit on 7/17 At that time they needed an epidural injection was not helpful-injection done in April He has been taking tramadol  in the morning hydrocodone  at night.  He also is using ibuprofen and Tylenol  as needed. I recently tried to decrease his strength of hydrocodone  from 5 to 2.5 mg Per Dr. 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 Previous treatments ineffective. No significant compression on imaging. Differential includes thoracic spine pathology. - Review thoracic spine MRI after it has occurred. - Order nerve conduction study (EMG) of LLE. - Consider nerve ablation/RFA - would suggest L1-2 level - Discuss potential for spinal cord  stimulator if other interventions fail and there is appropriate support from EMG Left arm and leg tingling and pain Recent onset of tingling and pain in left arm and leg. Symptoms likely separate from back pain. - Order cervical spine imaging if symptoms persist. Medication management We reviewed his current medications.  I have recommended that he consider naproxen  at nighttime with his pain medication and muscle relaxants to help him sleep.  I do not feel that he is at significant risk for respiratory depression as long as he takes his medications as recommended. We discussed that he should continue using a muscle relaxant and NSAID in addition to his other medications as prescribed by his primary care provider.  I would normally recommend naproxen  2 over-the-counter pills twice daily in addition to his muscle relaxant. If his nerve conduction study shows evidence of a neuropathy, we might consider spinal cord stimulation if there is no other possible intervention to consider.  Depending on the thoracic spine MRI findings, there may be consideration of thoracic epidural injections.  If no site for intervention is shown on his thoracic MRI scan or with his nerve conduction study, we will need to obtain lumbar spine flexion-extension x-rays to evaluate for possible instability at L1-2. I think it is fine for him to remain active as much as he can tolerate.  Reviewed PDMP He has been filling tramadol  number 30 about every 30 days, has also been filling hydrocodone  No. 30 pills approximately 30 days.  In addition he is using alprazolam  per another provider Patient Active Problem List  Diagnosis Date Noted   Chronic migraine without aura without status migrainosus, not intractable 08/08/2019   Hypertension 06/30/2019   Brain aneurysm 06/17/2019   Gastroenteritis 04/13/2019   Shoulder injury, left, initial encounter 01/24/2018   ADHD (attention deficit hyperactivity disorder) 04/04/2013   Anxiety  state 04/04/2013    Past Medical History:  Diagnosis Date   Aneurysm (HCC) 2020   Anxiety    managed per pt   Depression    haven't really dealt with in quite some time   High blood pressure    slightly elevated    No past surgical history on file.  Social History   Tobacco Use   Smoking status: Every Day    Current packs/day: 0.50    Types: Cigarettes   Smokeless tobacco: Never   Tobacco comments:    08/08/2019 update, trying to cut back   Vaping Use   Vaping status: Former  Substance Use Topics   Alcohol  use: Yes    Comment: social   Drug use: Yes    Types: Marijuana    Family History  Problem Relation Age of Onset   Stroke Mother        carotid artery dissection during massage    Aneurysm Paternal Grandmother    Hypertension Other        both sides of family   Lung cancer Other        both sides of family    Cerebral aneurysm Maternal Uncle    Migraines Neg Hx        none that he is aware of    Allergies  Allergen Reactions   Corticosteroids Other (See Comments)    Makes him mean.    Medication list has been reviewed and updated.  Current Outpatient Medications on File Prior to Visit  Medication Sig Dispense Refill   albuterol  (VENTOLIN  HFA) 108 (90 Base) MCG/ACT inhaler Inhale 2 puffs into the lungs every 6 (six) hours as needed for wheezing or shortness of breath. 8.5 g 1   ALPRAZolam  (XANAX ) 0.5 MG tablet Take 0.25-0.5 mg by mouth every 6 (six) hours as needed.     busPIRone  (BUSPAR ) 15 MG tablet Take 1.5 tablets (22.5 mg total) by mouth 2 (two) times daily. Take 1.5 tablets twice daily 270 tablet 2   cyclobenzaprine  (FLEXERIL ) 10 MG tablet Take 1 tablet (10 mg total) by mouth at bedtime. Use as needed for muscle spasm and pain.  Use this or hydrocodone  pain medication, not both 20 tablet 0   cyclobenzaprine  (FLEXERIL ) 10 MG tablet Take 1 tablet (10 mg total) by mouth 2 (two) times daily as needed for muscle spasms. 60 tablet 1   escitalopram  (LEXAPRO) 10 MG tablet Take 10 mg by mouth daily.     escitalopram (LEXAPRO) 20 MG tablet Take 20 mg by mouth daily.     HYDROcodone -Acetaminophen  2.5-325 MG TABS Take 1 by mouth in the evening as needed for back pain 30 tablet 0   losartan  (COZAAR ) 25 MG tablet Take 2 tablets (50 mg total) by mouth daily. Needs appt 60 tablet 0   naloxone (NARCAN) nasal spray 4 mg/0.1 mL SMARTSIG:Both Nares     nicotine  (NICODERM CQ ) 7 mg/24hr patch Place 1 patch (7 mg total) onto the skin daily. 28 patch 0   traMADol  (ULTRAM ) 50 MG tablet Take one by mouth in the morning as needed for back pain 30 tablet 2   tretinoin  (RETIN-A ) 0.05 % cream Apply topically at bedtime. Use every 2-3  days (Patient not taking: Reported on 05/18/2024) 45 g 2   No current facility-administered medications on file prior to visit.    Review of Systems:  As per HPI- otherwise negative.   Physical Examination: There were no vitals filed for this visit. There were no vitals filed for this visit. There is no height or weight on file to calculate BMI. Ideal Body Weight:    GEN: no acute distress. HEENT: Atraumatic, Normocephalic.  Ears and Nose: No external deformity. CV: RRR, No M/G/R. No JVD. No thrill. No extra heart sounds. PULM: CTA B, no wheezes, crackles, rhonchi. No retractions. No resp. distress. No accessory muscle use. ABD: S, NT, ND, +BS. No rebound. No HSM. EXTR: No c/c/e PSYCH: Normally interactive. Conversant.    Assessment and Plan: ***  Signed Harlene Schroeder, MD

## 2024-05-24 ENCOUNTER — Telehealth: Payer: Self-pay

## 2024-05-24 ENCOUNTER — Ambulatory Visit (INDEPENDENT_AMBULATORY_CARE_PROVIDER_SITE_OTHER): Admitting: Family Medicine

## 2024-05-24 ENCOUNTER — Encounter: Payer: Self-pay | Admitting: Family Medicine

## 2024-05-24 ENCOUNTER — Other Ambulatory Visit (HOSPITAL_COMMUNITY): Payer: Self-pay

## 2024-05-24 VITALS — BP 140/98 | HR 88 | Ht 70.0 in | Wt 194.4 lb

## 2024-05-24 DIAGNOSIS — M9903 Segmental and somatic dysfunction of lumbar region: Secondary | ICD-10-CM | POA: Diagnosis not present

## 2024-05-24 DIAGNOSIS — M2140 Flat foot [pes planus] (acquired), unspecified foot: Secondary | ICD-10-CM | POA: Diagnosis not present

## 2024-05-24 DIAGNOSIS — I10 Essential (primary) hypertension: Secondary | ICD-10-CM | POA: Diagnosis not present

## 2024-05-24 DIAGNOSIS — M9905 Segmental and somatic dysfunction of pelvic region: Secondary | ICD-10-CM | POA: Diagnosis not present

## 2024-05-24 DIAGNOSIS — M79606 Pain in leg, unspecified: Secondary | ICD-10-CM

## 2024-05-24 DIAGNOSIS — R051 Acute cough: Secondary | ICD-10-CM

## 2024-05-24 DIAGNOSIS — M545 Low back pain, unspecified: Secondary | ICD-10-CM

## 2024-05-24 LAB — COMPREHENSIVE METABOLIC PANEL WITH GFR
ALT: 47 U/L (ref 0–53)
AST: 26 U/L (ref 0–37)
Albumin: 4.4 g/dL (ref 3.5–5.2)
Alkaline Phosphatase: 87 U/L (ref 39–117)
BUN: 10 mg/dL (ref 6–23)
CO2: 29 meq/L (ref 19–32)
Calcium: 9.4 mg/dL (ref 8.4–10.5)
Chloride: 101 meq/L (ref 96–112)
Creatinine, Ser: 1.1 mg/dL (ref 0.40–1.50)
GFR: 87.22 mL/min (ref >60.00)
Glucose, Bld: 59 mg/dL — ABNORMAL LOW (ref 70–99)
Potassium: 4.1 meq/L (ref 3.5–5.1)
Sodium: 138 meq/L (ref 135–145)
Total Bilirubin: 0.5 mg/dL (ref 0.2–1.2)
Total Protein: 6.9 g/dL (ref 6.0–8.3)

## 2024-05-24 MED ORDER — LOSARTAN POTASSIUM 50 MG PO TABS: 75.0000 mg | ORAL_TABLET | Freq: Every day | ORAL | 3 refills | Status: AC

## 2024-05-24 MED ORDER — MELOXICAM 15 MG PO TABS: 15.0000 mg | ORAL_TABLET | Freq: Every day | ORAL | 1 refills | Status: DC

## 2024-05-24 MED ORDER — HYDROCODONE-ACETAMINOPHEN 5-325 MG PO TABS: 1.0000 | ORAL_TABLET | Freq: Every evening | ORAL | 0 refills | Status: DC | PRN

## 2024-05-24 NOTE — Patient Instructions (Addendum)
 It was good to see you today- I am sorry you are having such a hard time!  Let's increase losartan  to 75 mg daily Ok to use meloxicam  15 mg once daily in addition to your other medications Total acetaminophen  guidelines:   Minimum Dosing Interval: every 4 hours Maximum Single Dose: 1000 mg Maximum Dose: 4000 mg per 24 hours  Please stop by lab for blood and urine testing today  Please try to stay as active as you can. I think water exercise would be excellent for you.  You might also try some adapted pilates for core strength; looks for exercises you can do standing   Please stop by lab for a blood draw and urine today

## 2024-05-24 NOTE — Telephone Encounter (Signed)
 Pharmacy Patient Advocate Encounter  Received notification from [facility] that Prior Authorization for HYDROcodone -Acetaminophen  5-325MG  tablets  has been APPROVED from 05/24/24 to 06/23/24. Ran test claim, Copay is $6.40. This test claim was processed through [facility]- copay amounts may vary at other pharmacies due to pharmacy/plan contracts, or as the patient moves through the different stages of their insurance plan.   PA #/Case ID/Reference #: [id number]

## 2024-05-24 NOTE — Telephone Encounter (Signed)
 Pharmacy Patient Advocate Encounter   Received notification from CoverMyMeds that prior authorization for HYDROcodone -Acetaminophen  5-325MG  tablets  is required/requested.   Insurance verification completed.   The patient is insured through [facility] .   Per test claim: PA required; PA submitted to above mentioned insurance via CoverMyMeds Key/confirmation #/EOC [id number] Status is pending

## 2024-05-25 ENCOUNTER — Other Ambulatory Visit (HOSPITAL_COMMUNITY): Payer: Self-pay

## 2024-05-26 LAB — DRUG MONITORING, PANEL 8 WITH CONFIRMATION, URINE
6 Acetylmorphine: NEGATIVE ng/mL (ref <10)
Alcohol Metabolites: NEGATIVE ng/mL (ref <500)
Alphahydroxyalprazolam: 105 ng/mL — ABNORMAL HIGH (ref <25)
Alphahydroxymidazolam: NEGATIVE ng/mL (ref <50)
Alphahydroxytriazolam: NEGATIVE ng/mL (ref <50)
Aminoclonazepam: NEGATIVE ng/mL (ref <25)
Amphetamines: NEGATIVE ng/mL (ref <500)
Benzodiazepines: POSITIVE ng/mL — AB (ref <100)
Buprenorphine, Urine: NEGATIVE ng/mL (ref <5)
Cocaine Metabolite: NEGATIVE ng/mL (ref <150)
Codeine: NEGATIVE ng/mL (ref <50)
Creatinine: 106.1 mg/dL (ref >=20.0)
Hydrocodone: 155 ng/mL — ABNORMAL HIGH (ref <50)
Hydromorphone: 89 ng/mL — ABNORMAL HIGH (ref <50)
Hydroxyethylflurazepam: NEGATIVE ng/mL (ref <50)
Lorazepam: NEGATIVE ng/mL (ref <50)
MDMA: NEGATIVE ng/mL (ref <500)
Marijuana Metabolite: NEGATIVE ng/mL (ref <20)
Morphine: NEGATIVE ng/mL (ref <50)
Nordiazepam: NEGATIVE ng/mL (ref <50)
Norhydrocodone: 425 ng/mL — ABNORMAL HIGH (ref <50)
Opiates: POSITIVE ng/mL — AB (ref <100)
Oxazepam: NEGATIVE ng/mL (ref <50)
Oxidant: NEGATIVE ug/mL (ref <200)
Oxycodone: NEGATIVE ng/mL (ref <100)
Temazepam: NEGATIVE ng/mL (ref <50)
pH: 7 (ref 4.5–9.0)

## 2024-05-26 LAB — DM TEMPLATE

## 2024-05-29 DIAGNOSIS — M9905 Segmental and somatic dysfunction of pelvic region: Secondary | ICD-10-CM | POA: Diagnosis not present

## 2024-05-29 DIAGNOSIS — M9903 Segmental and somatic dysfunction of lumbar region: Secondary | ICD-10-CM | POA: Diagnosis not present

## 2024-05-29 DIAGNOSIS — M2140 Flat foot [pes planus] (acquired), unspecified foot: Secondary | ICD-10-CM | POA: Diagnosis not present

## 2024-05-30 DIAGNOSIS — M546 Pain in thoracic spine: Secondary | ICD-10-CM | POA: Diagnosis not present

## 2024-05-31 DIAGNOSIS — F3340 Major depressive disorder, recurrent, in remission, unspecified: Secondary | ICD-10-CM | POA: Diagnosis not present

## 2024-05-31 DIAGNOSIS — F411 Generalized anxiety disorder: Secondary | ICD-10-CM | POA: Diagnosis not present

## 2024-05-31 DIAGNOSIS — F1021 Alcohol dependence, in remission: Secondary | ICD-10-CM | POA: Diagnosis not present

## 2024-05-31 DIAGNOSIS — M9903 Segmental and somatic dysfunction of lumbar region: Secondary | ICD-10-CM | POA: Diagnosis not present

## 2024-05-31 DIAGNOSIS — M2140 Flat foot [pes planus] (acquired), unspecified foot: Secondary | ICD-10-CM | POA: Diagnosis not present

## 2024-05-31 DIAGNOSIS — F410 Panic disorder [episodic paroxysmal anxiety] without agoraphobia: Secondary | ICD-10-CM | POA: Diagnosis not present

## 2024-05-31 DIAGNOSIS — M9905 Segmental and somatic dysfunction of pelvic region: Secondary | ICD-10-CM | POA: Diagnosis not present

## 2024-05-31 MED ORDER — ALBUTEROL SULFATE HFA 108 (90 BASE) MCG/ACT IN AERS: 2.0000 | INHALATION_SPRAY | Freq: Four times a day (QID) | RESPIRATORY_TRACT | 1 refills | Status: DC | PRN

## 2024-06-05 DIAGNOSIS — M2140 Flat foot [pes planus] (acquired), unspecified foot: Secondary | ICD-10-CM | POA: Diagnosis not present

## 2024-06-05 DIAGNOSIS — M9905 Segmental and somatic dysfunction of pelvic region: Secondary | ICD-10-CM | POA: Diagnosis not present

## 2024-06-05 DIAGNOSIS — M9903 Segmental and somatic dysfunction of lumbar region: Secondary | ICD-10-CM | POA: Diagnosis not present

## 2024-06-06 DIAGNOSIS — M9905 Segmental and somatic dysfunction of pelvic region: Secondary | ICD-10-CM | POA: Diagnosis not present

## 2024-06-06 DIAGNOSIS — M2140 Flat foot [pes planus] (acquired), unspecified foot: Secondary | ICD-10-CM | POA: Diagnosis not present

## 2024-06-06 DIAGNOSIS — M9903 Segmental and somatic dysfunction of lumbar region: Secondary | ICD-10-CM | POA: Diagnosis not present

## 2024-06-07 DIAGNOSIS — M9905 Segmental and somatic dysfunction of pelvic region: Secondary | ICD-10-CM | POA: Diagnosis not present

## 2024-06-07 DIAGNOSIS — M9903 Segmental and somatic dysfunction of lumbar region: Secondary | ICD-10-CM | POA: Diagnosis not present

## 2024-06-07 DIAGNOSIS — M2140 Flat foot [pes planus] (acquired), unspecified foot: Secondary | ICD-10-CM | POA: Diagnosis not present

## 2024-06-26 ENCOUNTER — Encounter: Admitting: Neurology

## 2024-06-27 DIAGNOSIS — F410 Panic disorder [episodic paroxysmal anxiety] without agoraphobia: Secondary | ICD-10-CM | POA: Diagnosis not present

## 2024-06-27 DIAGNOSIS — F1021 Alcohol dependence, in remission: Secondary | ICD-10-CM | POA: Diagnosis not present

## 2024-06-27 DIAGNOSIS — F3340 Major depressive disorder, recurrent, in remission, unspecified: Secondary | ICD-10-CM | POA: Diagnosis not present

## 2024-06-27 DIAGNOSIS — F411 Generalized anxiety disorder: Secondary | ICD-10-CM | POA: Diagnosis not present

## 2024-06-27 MED ORDER — HYDROCODONE-ACETAMINOPHEN 5-325 MG PO TABS: 1.0000 | ORAL_TABLET | Freq: Every evening | ORAL | 0 refills | Status: AC | PRN

## 2024-06-27 MED ORDER — TRAMADOL HCL 50 MG PO TABS: ORAL_TABLET | ORAL | 2 refills | Status: DC

## 2024-06-27 NOTE — Addendum Note (Signed)
 Addended by: WATT RAISIN C on: 06/27/2024 03:18 PM   Modules accepted: Orders

## 2024-06-29 ENCOUNTER — Encounter: Admitting: Neurology

## 2024-07-04 DIAGNOSIS — M2140 Flat foot [pes planus] (acquired), unspecified foot: Secondary | ICD-10-CM | POA: Diagnosis not present

## 2024-07-04 DIAGNOSIS — M9905 Segmental and somatic dysfunction of pelvic region: Secondary | ICD-10-CM | POA: Diagnosis not present

## 2024-07-04 DIAGNOSIS — M9903 Segmental and somatic dysfunction of lumbar region: Secondary | ICD-10-CM | POA: Diagnosis not present

## 2024-07-05 DIAGNOSIS — M2140 Flat foot [pes planus] (acquired), unspecified foot: Secondary | ICD-10-CM | POA: Diagnosis not present

## 2024-07-05 DIAGNOSIS — M9905 Segmental and somatic dysfunction of pelvic region: Secondary | ICD-10-CM | POA: Diagnosis not present

## 2024-07-05 DIAGNOSIS — M9903 Segmental and somatic dysfunction of lumbar region: Secondary | ICD-10-CM | POA: Diagnosis not present

## 2024-07-10 DIAGNOSIS — F419 Anxiety disorder, unspecified: Secondary | ICD-10-CM | POA: Diagnosis not present

## 2024-07-10 DIAGNOSIS — Z630 Problems in relationship with spouse or partner: Secondary | ICD-10-CM | POA: Diagnosis not present

## 2024-07-10 DIAGNOSIS — M9903 Segmental and somatic dysfunction of lumbar region: Secondary | ICD-10-CM | POA: Diagnosis not present

## 2024-07-10 DIAGNOSIS — M9905 Segmental and somatic dysfunction of pelvic region: Secondary | ICD-10-CM | POA: Diagnosis not present

## 2024-07-10 DIAGNOSIS — F1611 Hallucinogen abuse, in remission: Secondary | ICD-10-CM | POA: Diagnosis not present

## 2024-07-10 DIAGNOSIS — F1021 Alcohol dependence, in remission: Secondary | ICD-10-CM | POA: Diagnosis not present

## 2024-07-10 DIAGNOSIS — M2140 Flat foot [pes planus] (acquired), unspecified foot: Secondary | ICD-10-CM | POA: Diagnosis not present

## 2024-07-11 DIAGNOSIS — M9903 Segmental and somatic dysfunction of lumbar region: Secondary | ICD-10-CM | POA: Diagnosis not present

## 2024-07-11 DIAGNOSIS — M2140 Flat foot [pes planus] (acquired), unspecified foot: Secondary | ICD-10-CM | POA: Diagnosis not present

## 2024-07-11 DIAGNOSIS — M9905 Segmental and somatic dysfunction of pelvic region: Secondary | ICD-10-CM | POA: Diagnosis not present

## 2024-07-17 DIAGNOSIS — F1021 Alcohol dependence, in remission: Secondary | ICD-10-CM | POA: Diagnosis not present

## 2024-07-17 DIAGNOSIS — Z630 Problems in relationship with spouse or partner: Secondary | ICD-10-CM | POA: Diagnosis not present

## 2024-07-17 DIAGNOSIS — F419 Anxiety disorder, unspecified: Secondary | ICD-10-CM | POA: Diagnosis not present

## 2024-07-17 DIAGNOSIS — F1611 Hallucinogen abuse, in remission: Secondary | ICD-10-CM | POA: Diagnosis not present

## 2024-07-18 DIAGNOSIS — M5414 Radiculopathy, thoracic region: Secondary | ICD-10-CM | POA: Diagnosis not present

## 2024-07-23 ENCOUNTER — Other Ambulatory Visit: Payer: Self-pay | Admitting: Family Medicine

## 2024-07-23 DIAGNOSIS — R051 Acute cough: Secondary | ICD-10-CM

## 2024-07-24 DIAGNOSIS — F1021 Alcohol dependence, in remission: Secondary | ICD-10-CM | POA: Diagnosis not present

## 2024-07-24 DIAGNOSIS — M9905 Segmental and somatic dysfunction of pelvic region: Secondary | ICD-10-CM | POA: Diagnosis not present

## 2024-07-24 DIAGNOSIS — Z630 Problems in relationship with spouse or partner: Secondary | ICD-10-CM | POA: Diagnosis not present

## 2024-07-24 DIAGNOSIS — M9903 Segmental and somatic dysfunction of lumbar region: Secondary | ICD-10-CM | POA: Diagnosis not present

## 2024-07-24 DIAGNOSIS — M2140 Flat foot [pes planus] (acquired), unspecified foot: Secondary | ICD-10-CM | POA: Diagnosis not present

## 2024-07-24 DIAGNOSIS — F1611 Hallucinogen abuse, in remission: Secondary | ICD-10-CM | POA: Diagnosis not present

## 2024-07-24 DIAGNOSIS — F419 Anxiety disorder, unspecified: Secondary | ICD-10-CM | POA: Diagnosis not present

## 2024-07-25 DIAGNOSIS — F410 Panic disorder [episodic paroxysmal anxiety] without agoraphobia: Secondary | ICD-10-CM | POA: Diagnosis not present

## 2024-07-25 DIAGNOSIS — F1021 Alcohol dependence, in remission: Secondary | ICD-10-CM | POA: Diagnosis not present

## 2024-07-25 DIAGNOSIS — F3340 Major depressive disorder, recurrent, in remission, unspecified: Secondary | ICD-10-CM | POA: Diagnosis not present

## 2024-07-26 ENCOUNTER — Encounter: Payer: Self-pay | Admitting: Family Medicine

## 2024-07-26 DIAGNOSIS — M2140 Flat foot [pes planus] (acquired), unspecified foot: Secondary | ICD-10-CM | POA: Diagnosis not present

## 2024-07-26 DIAGNOSIS — M9905 Segmental and somatic dysfunction of pelvic region: Secondary | ICD-10-CM | POA: Diagnosis not present

## 2024-07-26 DIAGNOSIS — M9903 Segmental and somatic dysfunction of lumbar region: Secondary | ICD-10-CM | POA: Diagnosis not present

## 2024-07-26 DIAGNOSIS — M545 Low back pain, unspecified: Secondary | ICD-10-CM

## 2024-07-26 MED ORDER — TRAMADOL HCL 50 MG PO TABS: ORAL_TABLET | ORAL | 2 refills | Status: AC

## 2024-07-26 MED ORDER — HYDROCODONE-ACETAMINOPHEN 5-325 MG PO TABS: ORAL_TABLET | ORAL | 0 refills | Status: DC

## 2024-07-26 MED ORDER — CYCLOBENZAPRINE HCL 10 MG PO TABS: 10.0000 mg | ORAL_TABLET | Freq: Two times a day (BID) | ORAL | 1 refills | Status: DC | PRN

## 2024-07-31 DIAGNOSIS — M9903 Segmental and somatic dysfunction of lumbar region: Secondary | ICD-10-CM | POA: Diagnosis not present

## 2024-07-31 DIAGNOSIS — M2140 Flat foot [pes planus] (acquired), unspecified foot: Secondary | ICD-10-CM | POA: Diagnosis not present

## 2024-07-31 DIAGNOSIS — M9905 Segmental and somatic dysfunction of pelvic region: Secondary | ICD-10-CM | POA: Diagnosis not present

## 2024-08-02 DIAGNOSIS — M9903 Segmental and somatic dysfunction of lumbar region: Secondary | ICD-10-CM | POA: Diagnosis not present

## 2024-08-02 DIAGNOSIS — M2140 Flat foot [pes planus] (acquired), unspecified foot: Secondary | ICD-10-CM | POA: Diagnosis not present

## 2024-08-02 DIAGNOSIS — M9905 Segmental and somatic dysfunction of pelvic region: Secondary | ICD-10-CM | POA: Diagnosis not present

## 2024-08-07 DIAGNOSIS — F1021 Alcohol dependence, in remission: Secondary | ICD-10-CM | POA: Diagnosis not present

## 2024-08-07 DIAGNOSIS — F1611 Hallucinogen abuse, in remission: Secondary | ICD-10-CM | POA: Diagnosis not present

## 2024-08-07 DIAGNOSIS — F419 Anxiety disorder, unspecified: Secondary | ICD-10-CM | POA: Diagnosis not present

## 2024-08-07 DIAGNOSIS — Z630 Problems in relationship with spouse or partner: Secondary | ICD-10-CM | POA: Diagnosis not present

## 2024-08-08 DIAGNOSIS — F1021 Alcohol dependence, in remission: Secondary | ICD-10-CM | POA: Diagnosis not present

## 2024-08-08 DIAGNOSIS — F3340 Major depressive disorder, recurrent, in remission, unspecified: Secondary | ICD-10-CM | POA: Diagnosis not present

## 2024-08-08 DIAGNOSIS — F411 Generalized anxiety disorder: Secondary | ICD-10-CM | POA: Diagnosis not present

## 2024-08-08 DIAGNOSIS — F410 Panic disorder [episodic paroxysmal anxiety] without agoraphobia: Secondary | ICD-10-CM | POA: Diagnosis not present

## 2024-08-10 DIAGNOSIS — Z630 Problems in relationship with spouse or partner: Secondary | ICD-10-CM | POA: Diagnosis not present

## 2024-08-10 DIAGNOSIS — F1021 Alcohol dependence, in remission: Secondary | ICD-10-CM | POA: Diagnosis not present

## 2024-08-10 DIAGNOSIS — F1611 Hallucinogen abuse, in remission: Secondary | ICD-10-CM | POA: Diagnosis not present

## 2024-08-10 DIAGNOSIS — F419 Anxiety disorder, unspecified: Secondary | ICD-10-CM | POA: Diagnosis not present

## 2024-08-14 DIAGNOSIS — Z630 Problems in relationship with spouse or partner: Secondary | ICD-10-CM | POA: Diagnosis not present

## 2024-08-14 DIAGNOSIS — F1611 Hallucinogen abuse, in remission: Secondary | ICD-10-CM | POA: Diagnosis not present

## 2024-08-14 DIAGNOSIS — F419 Anxiety disorder, unspecified: Secondary | ICD-10-CM | POA: Diagnosis not present

## 2024-08-14 DIAGNOSIS — F1021 Alcohol dependence, in remission: Secondary | ICD-10-CM | POA: Diagnosis not present

## 2024-08-15 DIAGNOSIS — M2140 Flat foot [pes planus] (acquired), unspecified foot: Secondary | ICD-10-CM | POA: Diagnosis not present

## 2024-08-15 DIAGNOSIS — M9905 Segmental and somatic dysfunction of pelvic region: Secondary | ICD-10-CM | POA: Diagnosis not present

## 2024-08-15 DIAGNOSIS — M9903 Segmental and somatic dysfunction of lumbar region: Secondary | ICD-10-CM | POA: Diagnosis not present

## 2024-08-16 DIAGNOSIS — M9905 Segmental and somatic dysfunction of pelvic region: Secondary | ICD-10-CM | POA: Diagnosis not present

## 2024-08-16 DIAGNOSIS — M2140 Flat foot [pes planus] (acquired), unspecified foot: Secondary | ICD-10-CM | POA: Diagnosis not present

## 2024-08-16 DIAGNOSIS — M9903 Segmental and somatic dysfunction of lumbar region: Secondary | ICD-10-CM | POA: Diagnosis not present

## 2024-08-17 DIAGNOSIS — F419 Anxiety disorder, unspecified: Secondary | ICD-10-CM | POA: Diagnosis not present

## 2024-08-17 DIAGNOSIS — F1611 Hallucinogen abuse, in remission: Secondary | ICD-10-CM | POA: Diagnosis not present

## 2024-08-17 DIAGNOSIS — F1021 Alcohol dependence, in remission: Secondary | ICD-10-CM | POA: Diagnosis not present

## 2024-08-17 DIAGNOSIS — Z630 Problems in relationship with spouse or partner: Secondary | ICD-10-CM | POA: Diagnosis not present

## 2024-08-21 DIAGNOSIS — F1021 Alcohol dependence, in remission: Secondary | ICD-10-CM | POA: Diagnosis not present

## 2024-08-21 DIAGNOSIS — F1611 Hallucinogen abuse, in remission: Secondary | ICD-10-CM | POA: Diagnosis not present

## 2024-08-21 DIAGNOSIS — Z630 Problems in relationship with spouse or partner: Secondary | ICD-10-CM | POA: Diagnosis not present

## 2024-08-21 DIAGNOSIS — M9905 Segmental and somatic dysfunction of pelvic region: Secondary | ICD-10-CM | POA: Diagnosis not present

## 2024-08-21 DIAGNOSIS — F419 Anxiety disorder, unspecified: Secondary | ICD-10-CM | POA: Diagnosis not present

## 2024-08-21 DIAGNOSIS — M9903 Segmental and somatic dysfunction of lumbar region: Secondary | ICD-10-CM | POA: Diagnosis not present

## 2024-08-21 DIAGNOSIS — M2140 Flat foot [pes planus] (acquired), unspecified foot: Secondary | ICD-10-CM | POA: Diagnosis not present

## 2024-08-22 DIAGNOSIS — F3340 Major depressive disorder, recurrent, in remission, unspecified: Secondary | ICD-10-CM | POA: Diagnosis not present

## 2024-08-22 DIAGNOSIS — F1021 Alcohol dependence, in remission: Secondary | ICD-10-CM | POA: Diagnosis not present

## 2024-08-22 DIAGNOSIS — F410 Panic disorder [episodic paroxysmal anxiety] without agoraphobia: Secondary | ICD-10-CM | POA: Diagnosis not present

## 2024-08-22 DIAGNOSIS — F411 Generalized anxiety disorder: Secondary | ICD-10-CM | POA: Diagnosis not present

## 2024-08-23 DIAGNOSIS — Z630 Problems in relationship with spouse or partner: Secondary | ICD-10-CM | POA: Diagnosis not present

## 2024-08-23 DIAGNOSIS — F419 Anxiety disorder, unspecified: Secondary | ICD-10-CM | POA: Diagnosis not present

## 2024-08-23 DIAGNOSIS — M2140 Flat foot [pes planus] (acquired), unspecified foot: Secondary | ICD-10-CM | POA: Diagnosis not present

## 2024-08-23 DIAGNOSIS — M9903 Segmental and somatic dysfunction of lumbar region: Secondary | ICD-10-CM | POA: Diagnosis not present

## 2024-08-23 DIAGNOSIS — F1021 Alcohol dependence, in remission: Secondary | ICD-10-CM | POA: Diagnosis not present

## 2024-08-23 DIAGNOSIS — M9905 Segmental and somatic dysfunction of pelvic region: Secondary | ICD-10-CM | POA: Diagnosis not present

## 2024-08-23 DIAGNOSIS — F1611 Hallucinogen abuse, in remission: Secondary | ICD-10-CM | POA: Diagnosis not present

## 2024-08-28 DIAGNOSIS — M9903 Segmental and somatic dysfunction of lumbar region: Secondary | ICD-10-CM | POA: Diagnosis not present

## 2024-08-28 DIAGNOSIS — M2140 Flat foot [pes planus] (acquired), unspecified foot: Secondary | ICD-10-CM | POA: Diagnosis not present

## 2024-08-28 DIAGNOSIS — M9905 Segmental and somatic dysfunction of pelvic region: Secondary | ICD-10-CM | POA: Diagnosis not present

## 2024-08-30 DIAGNOSIS — M9902 Segmental and somatic dysfunction of thoracic region: Secondary | ICD-10-CM | POA: Diagnosis not present

## 2024-08-30 DIAGNOSIS — M9905 Segmental and somatic dysfunction of pelvic region: Secondary | ICD-10-CM | POA: Diagnosis not present

## 2024-08-30 DIAGNOSIS — M2140 Flat foot [pes planus] (acquired), unspecified foot: Secondary | ICD-10-CM | POA: Diagnosis not present

## 2024-08-30 DIAGNOSIS — M9903 Segmental and somatic dysfunction of lumbar region: Secondary | ICD-10-CM | POA: Diagnosis not present

## 2024-08-31 DIAGNOSIS — F419 Anxiety disorder, unspecified: Secondary | ICD-10-CM | POA: Diagnosis not present

## 2024-08-31 DIAGNOSIS — F1611 Hallucinogen abuse, in remission: Secondary | ICD-10-CM | POA: Diagnosis not present

## 2024-08-31 DIAGNOSIS — F1021 Alcohol dependence, in remission: Secondary | ICD-10-CM | POA: Diagnosis not present

## 2024-08-31 DIAGNOSIS — Z630 Problems in relationship with spouse or partner: Secondary | ICD-10-CM | POA: Diagnosis not present

## 2024-09-04 DIAGNOSIS — F1021 Alcohol dependence, in remission: Secondary | ICD-10-CM | POA: Diagnosis not present

## 2024-09-04 DIAGNOSIS — F419 Anxiety disorder, unspecified: Secondary | ICD-10-CM | POA: Diagnosis not present

## 2024-09-04 DIAGNOSIS — Z630 Problems in relationship with spouse or partner: Secondary | ICD-10-CM | POA: Diagnosis not present

## 2024-09-04 DIAGNOSIS — F1611 Hallucinogen abuse, in remission: Secondary | ICD-10-CM | POA: Diagnosis not present

## 2024-09-07 DIAGNOSIS — F1021 Alcohol dependence, in remission: Secondary | ICD-10-CM | POA: Diagnosis not present

## 2024-09-07 DIAGNOSIS — F1611 Hallucinogen abuse, in remission: Secondary | ICD-10-CM | POA: Diagnosis not present

## 2024-09-07 DIAGNOSIS — Z630 Problems in relationship with spouse or partner: Secondary | ICD-10-CM | POA: Diagnosis not present

## 2024-09-07 DIAGNOSIS — F419 Anxiety disorder, unspecified: Secondary | ICD-10-CM | POA: Diagnosis not present

## 2024-09-08 ENCOUNTER — Encounter: Admitting: Neurology

## 2024-09-11 DIAGNOSIS — F1021 Alcohol dependence, in remission: Secondary | ICD-10-CM | POA: Diagnosis not present

## 2024-09-11 DIAGNOSIS — F419 Anxiety disorder, unspecified: Secondary | ICD-10-CM | POA: Diagnosis not present

## 2024-09-11 DIAGNOSIS — F1611 Hallucinogen abuse, in remission: Secondary | ICD-10-CM | POA: Diagnosis not present

## 2024-09-11 DIAGNOSIS — Z630 Problems in relationship with spouse or partner: Secondary | ICD-10-CM | POA: Diagnosis not present

## 2024-09-14 DIAGNOSIS — F419 Anxiety disorder, unspecified: Secondary | ICD-10-CM | POA: Diagnosis not present

## 2024-09-14 DIAGNOSIS — F1611 Hallucinogen abuse, in remission: Secondary | ICD-10-CM | POA: Diagnosis not present

## 2024-09-14 DIAGNOSIS — F1021 Alcohol dependence, in remission: Secondary | ICD-10-CM | POA: Diagnosis not present

## 2024-09-14 DIAGNOSIS — Z630 Problems in relationship with spouse or partner: Secondary | ICD-10-CM | POA: Diagnosis not present

## 2024-09-21 ENCOUNTER — Encounter: Payer: Self-pay | Admitting: Family Medicine

## 2024-09-21 DIAGNOSIS — M79606 Pain in leg, unspecified: Secondary | ICD-10-CM

## 2024-09-21 DIAGNOSIS — Z720 Tobacco use: Secondary | ICD-10-CM

## 2024-09-21 MED ORDER — NICOTINE 21 MG/24HR TD PT24: 21.0000 mg | MEDICATED_PATCH | Freq: Every day | TRANSDERMAL | 0 refills | Status: AC

## 2024-09-21 MED ORDER — HYDROCODONE-ACETAMINOPHEN 5-325 MG PO TABS
ORAL_TABLET | ORAL | 0 refills | Status: DC
Start: 2024-09-21 — End: 2024-09-21

## 2024-09-21 MED ORDER — HYDROCODONE-ACETAMINOPHEN 5-325 MG PO TABS: ORAL_TABLET | ORAL | 0 refills | Status: DC

## 2024-09-21 NOTE — Addendum Note (Signed)
 Addended by: WATT RAISIN C on: 09/21/2024 07:42 PM   Modules accepted: Orders

## 2024-10-02 ENCOUNTER — Other Ambulatory Visit: Payer: Self-pay | Admitting: Family Medicine

## 2024-10-02 DIAGNOSIS — F1021 Alcohol dependence, in remission: Secondary | ICD-10-CM | POA: Diagnosis not present

## 2024-10-02 DIAGNOSIS — F419 Anxiety disorder, unspecified: Secondary | ICD-10-CM | POA: Diagnosis not present

## 2024-10-02 DIAGNOSIS — F1611 Hallucinogen abuse, in remission: Secondary | ICD-10-CM | POA: Diagnosis not present

## 2024-10-02 DIAGNOSIS — Z630 Problems in relationship with spouse or partner: Secondary | ICD-10-CM | POA: Diagnosis not present

## 2024-10-05 DIAGNOSIS — F419 Anxiety disorder, unspecified: Secondary | ICD-10-CM | POA: Diagnosis not present

## 2024-10-05 DIAGNOSIS — Z630 Problems in relationship with spouse or partner: Secondary | ICD-10-CM | POA: Diagnosis not present

## 2024-10-05 DIAGNOSIS — F1021 Alcohol dependence, in remission: Secondary | ICD-10-CM | POA: Diagnosis not present

## 2024-10-05 DIAGNOSIS — F1611 Hallucinogen abuse, in remission: Secondary | ICD-10-CM | POA: Diagnosis not present

## 2024-10-09 DIAGNOSIS — F419 Anxiety disorder, unspecified: Secondary | ICD-10-CM | POA: Diagnosis not present

## 2024-10-09 DIAGNOSIS — F1611 Hallucinogen abuse, in remission: Secondary | ICD-10-CM | POA: Diagnosis not present

## 2024-10-09 DIAGNOSIS — F1021 Alcohol dependence, in remission: Secondary | ICD-10-CM | POA: Diagnosis not present

## 2024-10-09 DIAGNOSIS — Z630 Problems in relationship with spouse or partner: Secondary | ICD-10-CM | POA: Diagnosis not present

## 2024-10-12 DIAGNOSIS — F419 Anxiety disorder, unspecified: Secondary | ICD-10-CM | POA: Diagnosis not present

## 2024-10-12 DIAGNOSIS — F1021 Alcohol dependence, in remission: Secondary | ICD-10-CM | POA: Diagnosis not present

## 2024-10-12 DIAGNOSIS — F1611 Hallucinogen abuse, in remission: Secondary | ICD-10-CM | POA: Diagnosis not present

## 2024-10-12 DIAGNOSIS — Z630 Problems in relationship with spouse or partner: Secondary | ICD-10-CM | POA: Diagnosis not present

## 2024-10-16 DIAGNOSIS — Z630 Problems in relationship with spouse or partner: Secondary | ICD-10-CM | POA: Diagnosis not present

## 2024-10-16 DIAGNOSIS — F419 Anxiety disorder, unspecified: Secondary | ICD-10-CM | POA: Diagnosis not present

## 2024-10-16 DIAGNOSIS — F1611 Hallucinogen abuse, in remission: Secondary | ICD-10-CM | POA: Diagnosis not present

## 2024-10-16 DIAGNOSIS — F1021 Alcohol dependence, in remission: Secondary | ICD-10-CM | POA: Diagnosis not present

## 2024-10-19 DIAGNOSIS — M5414 Radiculopathy, thoracic region: Secondary | ICD-10-CM | POA: Diagnosis not present

## 2024-10-19 DIAGNOSIS — Z630 Problems in relationship with spouse or partner: Secondary | ICD-10-CM | POA: Diagnosis not present

## 2024-10-19 DIAGNOSIS — F1611 Hallucinogen abuse, in remission: Secondary | ICD-10-CM | POA: Diagnosis not present

## 2024-10-19 DIAGNOSIS — F1021 Alcohol dependence, in remission: Secondary | ICD-10-CM | POA: Diagnosis not present

## 2024-10-19 DIAGNOSIS — F419 Anxiety disorder, unspecified: Secondary | ICD-10-CM | POA: Diagnosis not present

## 2024-11-17 ENCOUNTER — Other Ambulatory Visit: Payer: Self-pay | Admitting: Family Medicine

## 2024-11-17 DIAGNOSIS — M79606 Pain in leg, unspecified: Secondary | ICD-10-CM

## 2024-11-19 ENCOUNTER — Encounter: Payer: Self-pay | Admitting: Family Medicine

## 2024-11-19 DIAGNOSIS — M545 Low back pain, unspecified: Secondary | ICD-10-CM

## 2024-11-20 MED ORDER — HYDROCODONE-ACETAMINOPHEN 5-325 MG PO TABS: ORAL_TABLET | ORAL | 0 refills | Status: AC

## 2024-11-20 MED ORDER — CYCLOBENZAPRINE HCL 10 MG PO TABS: 10.0000 mg | ORAL_TABLET | Freq: Two times a day (BID) | ORAL | 1 refills | Status: AC | PRN
